# Patient Record
Sex: Female | Born: 1935 | Race: White | Hispanic: No | Smoking: Never smoker
Health system: Southern US, Community
[De-identification: ages and names within clinical notes are randomized; demographics above are authoritative.]

## PROBLEM LIST (undated history)

## (undated) DIAGNOSIS — I739 Peripheral vascular disease, unspecified: Secondary | ICD-10-CM

## (undated) DIAGNOSIS — F0390 Unspecified dementia without behavioral disturbance: Secondary | ICD-10-CM

## (undated) DIAGNOSIS — K5790 Diverticulosis of intestine, part unspecified, without perforation or abscess without bleeding: Secondary | ICD-10-CM

## (undated) DIAGNOSIS — I10 Essential (primary) hypertension: Secondary | ICD-10-CM

## (undated) DIAGNOSIS — M810 Age-related osteoporosis without current pathological fracture: Secondary | ICD-10-CM

## (undated) DIAGNOSIS — E079 Disorder of thyroid, unspecified: Secondary | ICD-10-CM

## (undated) DIAGNOSIS — N289 Disorder of kidney and ureter, unspecified: Secondary | ICD-10-CM

## (undated) DIAGNOSIS — S42391B Other fracture of shaft of right humerus, initial encounter for open fracture: Secondary | ICD-10-CM

## (undated) DIAGNOSIS — E785 Hyperlipidemia, unspecified: Secondary | ICD-10-CM

## (undated) HISTORY — DX: Disorder of thyroid, unspecified: E07.9

## (undated) HISTORY — DX: Other fracture of shaft of right humerus, initial encounter for open fracture: S42.391B

## (undated) HISTORY — DX: Essential (primary) hypertension: I10

## (undated) HISTORY — DX: Age-related osteoporosis without current pathological fracture: M81.0

## (undated) HISTORY — DX: Unspecified dementia, unspecified severity, without behavioral disturbance, psychotic disturbance, mood disturbance, and anxiety: F03.90

## (undated) HISTORY — DX: Disorder of kidney and ureter, unspecified: N28.9

## (undated) HISTORY — DX: Hyperlipidemia, unspecified: E78.5

## (undated) HISTORY — DX: Diverticulosis of intestine, part unspecified, without perforation or abscess without bleeding: K57.90

## (undated) HISTORY — DX: Peripheral vascular disease, unspecified: I73.9

## (undated) HISTORY — PX: OTHER SURGICAL HISTORY: SHX169

## (undated) HISTORY — PX: TONSILLECTOMY: SUR1361

## (undated) HISTORY — PX: CHOLECYSTECTOMY: SHX55

## (undated) HISTORY — PX: APPENDECTOMY: SHX54

---

## 1970-04-22 HISTORY — PX: ABDOMINAL HYSTERECTOMY: SHX81

## 2006-03-10 HISTORY — PX: COLONOSCOPY: SHX174

## 2012-09-20 HISTORY — PX: OVARIAN CYST REMOVAL: SHX89

## 2018-11-06 ENCOUNTER — Other Ambulatory Visit: Payer: Self-pay

## 2018-11-09 ENCOUNTER — Encounter: Payer: Self-pay | Admitting: Family Medicine

## 2018-11-09 ENCOUNTER — Other Ambulatory Visit: Payer: Self-pay

## 2018-11-09 ENCOUNTER — Ambulatory Visit (INDEPENDENT_AMBULATORY_CARE_PROVIDER_SITE_OTHER): Payer: Medicare Other | Admitting: Family Medicine

## 2018-11-09 VITALS — BP 130/68 | HR 88 | Temp 97.9°F | Resp 16 | Ht 59.45 in | Wt 98.0 lb

## 2018-11-09 DIAGNOSIS — H3530 Unspecified macular degeneration: Secondary | ICD-10-CM | POA: Diagnosis not present

## 2018-11-09 DIAGNOSIS — E039 Hypothyroidism, unspecified: Secondary | ICD-10-CM | POA: Insufficient documentation

## 2018-11-09 DIAGNOSIS — M816 Localized osteoporosis [Lequesne]: Secondary | ICD-10-CM | POA: Diagnosis not present

## 2018-11-09 DIAGNOSIS — E782 Mixed hyperlipidemia: Secondary | ICD-10-CM

## 2018-11-09 DIAGNOSIS — M810 Age-related osteoporosis without current pathological fracture: Secondary | ICD-10-CM | POA: Insufficient documentation

## 2018-11-09 DIAGNOSIS — I10 Essential (primary) hypertension: Secondary | ICD-10-CM | POA: Diagnosis not present

## 2018-11-09 DIAGNOSIS — K529 Noninfective gastroenteritis and colitis, unspecified: Secondary | ICD-10-CM | POA: Insufficient documentation

## 2018-11-09 DIAGNOSIS — E785 Hyperlipidemia, unspecified: Secondary | ICD-10-CM | POA: Insufficient documentation

## 2018-11-09 NOTE — Assessment & Plan Note (Signed)
Obtain most recent labs for thyroid Continue levothyroixine

## 2018-11-09 NOTE — Progress Notes (Signed)
   Subjective:    Patient ID: Dawn Conner, female    DOB: [birth year], 85 y.o.   MRN: [id number]  Patient presents for New Patient- Establish Care (is not fasting)    Pt here to f/u chronic medical problems   [facility] PCP Dr. Balinda Quails MD, Barnabas Lister    Recently moved here with daughter Dawn Conner    Osteoporosis- was on reclast , on prolia now in April 2020    Calcium  600mg  and Vitamin D 1000IU    History of vitamin D def    Broke right arm/wrist around 1994 walking a dog she fell  Wears Dentures       Hypothyroidism- taking sythroid, no history of surgical intervejtion  45mcg once a day     HTN- Norvasc 5mg      Hyperlipidemia- zocor    Has allergy to sulfa drugs   she has diarrhea episodes that are chronic/ diverticulosis    Non smoker / no alcohol   Wears poise pad has some OAB   He has had cataract surgery they have been watering her eyes for macular degeneration she would like to establish with ophthalmologist here  Immunizations- believes UTD, except TDAP  Review Of Systems:  GEN- denies fatigue, fever, weight loss,weakness, recent illness HEENT- denies eye drainage, change in vision, nasal discharge, CVS- denies chest pain, palpitations RESP- denies SOB, cough, wheeze ABD- denies N/V, change in stools, abd pain GU- denies dysuria, hematuria, dribbling, incontinence MSK- denies joint pain, muscle aches, injury Neuro- denies headache, dizziness, syncope, seizure activity       Objective:    BP 130/68   Pulse 88   Temp 97.9 F (36.6 C) (Oral)   Resp 16   Ht 4' 11.45" (1.51 m)   Wt 98 lb (44.5 kg)   SpO2 97%   BMI 19.50 kg/m  GEN- NAD, alert and oriented x3 HEENT- PERRL, EOMI, non injected sclera, pink conjunctiva, MMM, oropharynx clear Neck- Supple, no thyromegaly CVS- RRR, no murmur RESP-CTAB ABD-NABS,soft,NT,ND EXT- No edema Pulses- Radial, DP- 2+        Assessment & Plan:     I will review old records   Problem List Items  Addressed This Visit      Unprioritized   Chronic diarrhea    Watches diet, improved since moving down here with daughter who prepares meals Hold on GI for now       Hyperlipidemia    She has been tolerating zocor with norvasc      Relevant Medications   amLODipine (NORVASC) 5 MG tablet   lisinopril (ZESTRIL) 20 MG tablet   simvastatin (ZOCOR) 40 MG tablet   aspirin EC 81 MG tablet   Hypertension    Well controlled no changes       Relevant Medications   amLODipine (NORVASC) 5 MG tablet   lisinopril (ZESTRIL) 20 MG tablet   simvastatin (ZOCOR) 40 MG tablet   aspirin EC 81 MG tablet   Hypothyroidism    Obtain most recent labs for thyroid Continue levothyroixine      Relevant Medications   levothyroxine (SYNTHROID) 75 MCG tablet   Osteoporosis    Plan to restart Prolia injections Obtain last bone density       Relevant Medications   Cholecalciferol (VITAMIN D) 50 MCG (2000 UT) tablet      Note: This dictation was prepared with Dragon dictation along with smaller phrase technology. Any transcriptional errors that result from this process are unintentional.

## 2018-11-09 NOTE — Assessment & Plan Note (Signed)
Plan to restart Prolia injections Obtain last bone density

## 2018-11-09 NOTE — Assessment & Plan Note (Signed)
She has been tolerating zocor with norvasc

## 2018-11-09 NOTE — Patient Instructions (Addendum)
Prolia to be set up Release of records for previous PCP and GYN Referral to eye doctor  F/U 6 months

## 2018-11-09 NOTE — Assessment & Plan Note (Signed)
Watches diet, improved since moving down here with daughter who prepares meals Hold on GI for now

## 2018-11-09 NOTE — Assessment & Plan Note (Signed)
Well controlled no changes 

## 2018-11-18 ENCOUNTER — Other Ambulatory Visit: Payer: Self-pay | Admitting: *Deleted

## 2018-11-18 ENCOUNTER — Telehealth: Payer: Self-pay | Admitting: *Deleted

## 2018-11-18 DIAGNOSIS — E039 Hypothyroidism, unspecified: Secondary | ICD-10-CM

## 2018-11-18 DIAGNOSIS — I10 Essential (primary) hypertension: Secondary | ICD-10-CM

## 2018-11-18 DIAGNOSIS — E782 Mixed hyperlipidemia: Secondary | ICD-10-CM

## 2018-11-18 NOTE — Telephone Encounter (Signed)
-----   Message from Alycia Rossetti, MD sent at 11/18/2018 12:02 PM EDT ----- Regarding: Call pt daughter Ihor Austin   The records I have show her last Lipid was in  2018 and Thyroid done 1 year ago. She needs updated labs      Hypothyroidism- TSH/FT3     HTN- CMET/CBC    Hyperlipidemia-  Lipid panel

## 2018-11-18 NOTE — Telephone Encounter (Signed)
Call placed to patient and patient daughter made aware.   Future lab orders placed.

## 2018-11-20 ENCOUNTER — Other Ambulatory Visit: Payer: Self-pay | Admitting: *Deleted

## 2018-12-07 ENCOUNTER — Other Ambulatory Visit: Payer: Medicare Other

## 2018-12-07 ENCOUNTER — Other Ambulatory Visit: Payer: Self-pay

## 2018-12-07 DIAGNOSIS — E039 Hypothyroidism, unspecified: Secondary | ICD-10-CM

## 2018-12-07 DIAGNOSIS — I10 Essential (primary) hypertension: Secondary | ICD-10-CM

## 2018-12-07 DIAGNOSIS — E782 Mixed hyperlipidemia: Secondary | ICD-10-CM

## 2018-12-08 LAB — CBC WITH DIFFERENTIAL/PLATELET
Absolute Monocytes: 679 cells/uL (ref 200–950)
Basophils Absolute: 41 cells/uL (ref 0–200)
Basophils Relative: 0.7 %
Eosinophils Absolute: 159 cells/uL (ref 15–500)
Eosinophils Relative: 2.7 %
HCT: 37.3 % (ref 35.0–45.0)
Hemoglobin: 11.8 g/dL (ref 11.7–15.5)
Lymphs Abs: 1864 cells/uL (ref 850–3900)
MCH: 26.7 pg — ABNORMAL LOW (ref 27.0–33.0)
MCHC: 31.6 g/dL — ABNORMAL LOW (ref 32.0–36.0)
MCV: 84.4 fL (ref 80.0–100.0)
MPV: 11.6 fL (ref 7.5–12.5)
Monocytes Relative: 11.5 %
Neutro Abs: 3157 cells/uL (ref 1500–7800)
Neutrophils Relative %: 53.5 %
Platelets: 204 10*3/uL (ref 140–400)
RBC: 4.42 10*6/uL (ref 3.80–5.10)
RDW: 13.6 % (ref 11.0–15.0)
Total Lymphocyte: 31.6 %
WBC: 5.9 10*3/uL (ref 3.8–10.8)

## 2018-12-08 LAB — COMPLETE METABOLIC PANEL WITH GFR
AG Ratio: 1.3 (calc) (ref 1.0–2.5)
ALT: 21 U/L (ref 6–29)
AST: 27 U/L (ref 10–35)
Albumin: 3.9 g/dL (ref 3.6–5.1)
Alkaline phosphatase (APISO): 45 U/L (ref 37–153)
BUN/Creatinine Ratio: 25 (calc) — ABNORMAL HIGH (ref 6–22)
BUN: 29 mg/dL — ABNORMAL HIGH (ref 7–25)
CO2: 27 mmol/L (ref 20–32)
Calcium: 9.8 mg/dL (ref 8.6–10.4)
Chloride: 107 mmol/L (ref 98–110)
Creat: 1.16 mg/dL — ABNORMAL HIGH (ref 0.60–0.88)
GFR, Est African American: 48 mL/min/{1.73_m2} — ABNORMAL LOW (ref >=60)
GFR, Est Non African American: 42 mL/min/{1.73_m2} — ABNORMAL LOW (ref >=60)
Globulin: 3.1 g/dL (calc) (ref 1.9–3.7)
Glucose, Bld: 96 mg/dL (ref 65–99)
Potassium: 4.6 mmol/L (ref 3.5–5.3)
Sodium: 141 mmol/L (ref 135–146)
Total Bilirubin: 0.5 mg/dL (ref 0.2–1.2)
Total Protein: 7 g/dL (ref 6.1–8.1)

## 2018-12-08 LAB — T3, FREE: T3, Free: 2.8 pg/mL (ref 2.3–4.2)

## 2018-12-08 LAB — LIPID PANEL
Cholesterol: 169 mg/dL (ref <200)
HDL: 51 mg/dL (ref >=50)
LDL Cholesterol (Calc): 99 mg/dL (calc)
Non-HDL Cholesterol (Calc): 118 mg/dL (calc) (ref <130)
Total CHOL/HDL Ratio: 3.3 (calc) (ref <5.0)
Triglycerides: 94 mg/dL (ref <150)

## 2018-12-08 LAB — TSH: TSH: 0.99 mIU/L (ref 0.40–4.50)

## 2018-12-08 LAB — T4, FREE: Free T4: 1.3 ng/dL (ref 0.8–1.8)

## 2018-12-23 ENCOUNTER — Other Ambulatory Visit: Payer: Self-pay | Admitting: Family Medicine

## 2018-12-23 MED ORDER — AMLODIPINE BESYLATE 5 MG PO TABS: 5.0000 mg | ORAL_TABLET | Freq: Every day | ORAL | 3 refills | Status: DC

## 2019-02-08 ENCOUNTER — Encounter: Payer: Self-pay | Admitting: Family Medicine

## 2019-02-08 ENCOUNTER — Ambulatory Visit (INDEPENDENT_AMBULATORY_CARE_PROVIDER_SITE_OTHER): Payer: Medicare Other | Admitting: Family Medicine

## 2019-02-08 ENCOUNTER — Other Ambulatory Visit: Payer: Self-pay

## 2019-02-08 VITALS — BP 132/70 | HR 82 | Temp 97.9°F | Resp 14 | Ht 59.45 in | Wt 99.8 lb

## 2019-02-08 DIAGNOSIS — Z23 Encounter for immunization: Secondary | ICD-10-CM

## 2019-02-08 DIAGNOSIS — R413 Other amnesia: Secondary | ICD-10-CM | POA: Diagnosis not present

## 2019-02-08 NOTE — Progress Notes (Signed)
   Subjective:    Patient ID: Dawn Conner, female    DOB: 10-13-1927, 85 y.o.   MRN: [id number]  Patient presents for Memory Loss   Patient here with her daughter.  She has noticed over the past few months worsening memory changes.  She is actually had some changes over the past couple years but she now lives here with her daughter.  She will often forget names or dates.  She often stays the wrong year.  She does need help with grocery shopping making list.  Her daughter does her finances.  She has not had any behavioral problems.  She sleeps well her appetite is good.  There has been no significant medication changes recently.       Review Of Systems:  GEN- denies fatigue, fever, weight loss,weakness, recent illness HEENT- denies eye drainage, change in vision, nasal discharge, CVS- denies chest pain, palpitations RESP- denies SOB, cough, wheeze ABD- denies N/V, change in stools, abd pain GU- denies dysuria, hematuria, dribbling, incontinence MSK- denies joint pain, muscle aches, injury Neuro- denies headache, dizziness, syncope, seizure activity       Objective:    BP 132/70   Pulse 82   Temp 97.9 F (36.6 C) (Oral)   Resp 14   Ht 4' 11.45" (1.51 m)   Wt 99 lb 12.8 oz (45.3 kg)   SpO2 99%   BMI 19.85 kg/m  GEN- NAD, alert and oriented x3 HEENT- PERRL, EOMI, non injected sclera, pink conjunctiva, MMM, oropharynx clear Neck- Supple, no thyromegaly CVS- RRR, no murmur RESP-CTAB Psych- pleasant, normal affect and mood  EXT- No edema Pulses- Radial, DP- 2+  MMSE 21/30      Assessment & Plan:      Problem List Items Addressed This Visit    None    Visit Diagnoses    Memory loss    -  Primary   Memory loss/changes, some of this is age related but she also had HTN comorbidity that can lead to vascular dementia. Family would like this worked up further.  I will check a B12 level and metabolic panel.  We will plan for an MRI of the brain she will need Valium 2 mg prior  to the MRI for anxiety.  After that we will decide if one of the medication such as Aricept or Namenda would be beneficial to her.   Relevant Orders   Vitamin B12 (Completed)   Basic metabolic panel (Completed)   Need for immunization against influenza       Relevant Orders   Flu Vaccine QUAD 36+ mos IM (Completed)      Note: This dictation was prepared with Dragon dictation along with smaller phrase technology. Any transcriptional errors that result from this process are unintentional.

## 2019-02-08 NOTE — Patient Instructions (Addendum)
MRi of brain to be done  We will call with lab results  Flu shot given  Valium 2mg 

## 2019-02-09 ENCOUNTER — Encounter: Payer: Self-pay | Admitting: Family Medicine

## 2019-02-09 LAB — BASIC METABOLIC PANEL
BUN/Creatinine Ratio: 28 (calc) — ABNORMAL HIGH (ref 6–22)
BUN: 36 mg/dL — ABNORMAL HIGH (ref 7–25)
CO2: 25 mmol/L (ref 20–32)
Calcium: 9.6 mg/dL (ref 8.6–10.4)
Chloride: 105 mmol/L (ref 98–110)
Creat: 1.29 mg/dL — ABNORMAL HIGH (ref 0.60–0.88)
Glucose, Bld: 123 mg/dL — ABNORMAL HIGH (ref 65–99)
Potassium: 4.7 mmol/L (ref 3.5–5.3)
Sodium: 140 mmol/L (ref 135–146)

## 2019-02-09 LAB — VITAMIN B12: Vitamin B-12: 406 pg/mL (ref 200–1100)

## 2019-02-09 MED ORDER — DIAZEPAM 2 MG PO TABS: ORAL_TABLET | ORAL | 0 refills | Status: DC

## 2019-02-16 ENCOUNTER — Ambulatory Visit (HOSPITAL_COMMUNITY): Payer: Medicare Other

## 2019-02-19 ENCOUNTER — Ambulatory Visit (HOSPITAL_COMMUNITY)
Admission: RE | Admit: 2019-02-19 | Discharge: 2019-02-19 | Disposition: A | Discharge status: Home / Self Care | Payer: Medicare Other | Source: Ambulatory Visit | Attending: Family Medicine | Admitting: Family Medicine

## 2019-02-19 ENCOUNTER — Other Ambulatory Visit: Payer: Self-pay

## 2019-02-19 DIAGNOSIS — R413 Other amnesia: Secondary | ICD-10-CM | POA: Insufficient documentation

## 2019-02-23 ENCOUNTER — Other Ambulatory Visit: Payer: Self-pay

## 2019-02-23 MED ORDER — DONEPEZIL HCL 5 MG PO TABS: 5.0000 mg | ORAL_TABLET | Freq: Every day | ORAL | 1 refills | Status: DC

## 2019-02-26 ENCOUNTER — Other Ambulatory Visit: Payer: Self-pay | Admitting: *Deleted

## 2019-02-26 MED ORDER — LISINOPRIL 20 MG PO TABS: 20.0000 mg | ORAL_TABLET | Freq: Every day | ORAL | 3 refills | Status: DC

## 2019-03-12 ENCOUNTER — Other Ambulatory Visit: Payer: Self-pay | Admitting: *Deleted

## 2019-03-12 MED ORDER — SIMVASTATIN 40 MG PO TABS: 40.0000 mg | ORAL_TABLET | Freq: Every day | ORAL | 3 refills | Status: DC

## 2019-04-02 ENCOUNTER — Other Ambulatory Visit: Payer: Self-pay | Admitting: *Deleted

## 2019-04-02 MED ORDER — LEVOTHYROXINE SODIUM 75 MCG PO TABS: 75.0000 ug | ORAL_TABLET | Freq: Every day | ORAL | 1 refills | Status: DC

## 2019-04-08 ENCOUNTER — Other Ambulatory Visit: Payer: Self-pay

## 2019-04-08 ENCOUNTER — Ambulatory Visit (INDEPENDENT_AMBULATORY_CARE_PROVIDER_SITE_OTHER): Payer: Medicare Other

## 2019-04-08 DIAGNOSIS — M816 Localized osteoporosis [Lequesne]: Secondary | ICD-10-CM

## 2019-04-08 MED ORDER — DENOSUMAB 60 MG/ML ~~LOC~~ SOSY
60.0000 mg | PREFILLED_SYRINGE | SUBCUTANEOUS | Status: DC
  Administered 2019-04-08 – 2022-03-27 (×4): 60 mg via SUBCUTANEOUS

## 2019-04-08 NOTE — Progress Notes (Signed)
Patient was given her prolia injection in the right arm. Patient tolerated well. Advised to return in 6 months for next injection.

## 2019-04-21 ENCOUNTER — Telehealth: Payer: Self-pay | Admitting: *Deleted

## 2019-04-21 DIAGNOSIS — K529 Noninfective gastroenteritis and colitis, unspecified: Secondary | ICD-10-CM

## 2019-04-21 NOTE — Telephone Encounter (Signed)
Received call from patient daughter. Reports that she would like to proceed with referral to GI for patient for chronic diarrhea.   MD made aware and approved.   Referral orders placed.   Call placed to patient and patient daughter made aware per VM.

## 2019-04-21 NOTE — Telephone Encounter (Signed)
Agree with referral

## 2019-04-22 ENCOUNTER — Other Ambulatory Visit: Payer: Self-pay | Admitting: Family Medicine

## 2019-04-26 ENCOUNTER — Encounter: Payer: Self-pay | Admitting: Gastroenterology

## 2019-05-14 ENCOUNTER — Ambulatory Visit (INDEPENDENT_AMBULATORY_CARE_PROVIDER_SITE_OTHER): Payer: Medicare Other | Admitting: Family Medicine

## 2019-05-14 ENCOUNTER — Other Ambulatory Visit: Payer: Self-pay

## 2019-05-14 VITALS — BP 122/76 | HR 82 | Temp 97.6°F | Resp 18 | Ht 59.45 in | Wt 102.4 lb

## 2019-05-14 DIAGNOSIS — E039 Hypothyroidism, unspecified: Secondary | ICD-10-CM | POA: Diagnosis not present

## 2019-05-14 DIAGNOSIS — M816 Localized osteoporosis [Lequesne]: Secondary | ICD-10-CM

## 2019-05-14 DIAGNOSIS — N289 Disorder of kidney and ureter, unspecified: Secondary | ICD-10-CM

## 2019-05-14 DIAGNOSIS — I10 Essential (primary) hypertension: Secondary | ICD-10-CM | POA: Diagnosis not present

## 2019-05-14 MED ORDER — LISINOPRIL 20 MG PO TABS: 10.0000 mg | ORAL_TABLET | Freq: Every day | ORAL | 3 refills | Status: DC

## 2019-05-14 NOTE — Patient Instructions (Addendum)
Decrease lisinopril to 10mg  once a day  We will call with lab results F/U End of June for Physical

## 2019-05-14 NOTE — Progress Notes (Signed)
    Subjective:    Patient ID: Dawn Conner, female    DOB: Jun 02, 1927, 86 y.o.   MRN: [id number]  Patient presents for Hypertension (follow up)   Appetite has been fair. She is now over 100lbs.    Has appt with GI on Wed for the chronic diarrhea      HTN- on occasion she has complained of being lightheaded or dizzy. Family checks BP sometimes, has not been taken recently. No complaint of SOB, chest pain, no falls, no syncope   Hypothyroidism- taking Levothyroxine as prescribed   Dementia- aricept at bedtime, no major changes in memory, things have been harder since she is mostly within the home due to COVID-19 pandemic   Osteoporosis- prolia shot given in December     Review Of Systems: per pt and daughter   GEN- denies fatigue, fever, weight loss,weakness, recent illness HEENT- denies eye drainage, change in vision, nasal discharge, CVS- denies chest pain, palpitations RESP- denies SOB, cough, wheeze ABD- denies N/V, change in stools, abd pain GU- denies dysuria, hematuria, dribbling, incontinence MSK- denies joint pain, muscle aches, injury Neuro- denies headache, +dizziness, syncope, seizure activity       Objective:    BP 122/76 (BP Location: Right Arm, Patient Position: Sitting, Cuff Size: Normal)   Pulse 82   Temp 97.6 F (36.4 C) (Oral)   Resp 18   Ht 4' 11.45" (1.51 m)   Wt 102 lb 6.4 oz (46.4 kg)   SpO2 99%   BMI 20.37 kg/m  GEN- NAD, alert and oriented x 2 HEENT- PERRL, EOMI, non injected sclera, pink conjunctiva,  Neck- Supple, no thyromegaly CVS- RRR, no murmur RESP-CTAB ABD-NABS,soft,NT,ND EXT- No edema Pulses- Radial  2+        Assessment & Plan:      Problem List Items Addressed This Visit      Unprioritized   Hypertension - Primary    Repeat BP was higher, however at her age concerned about dizziness and overtreatment with the BP Due to her renal function, will reduce lisinopril to 10mg  first  Continue norvasc 5mg        Relevant  Medications   lisinopril (ZESTRIL) 20 MG tablet   Other Relevant Orders   BASIC METABOLIC PANEL WITH GFR (Completed)   CBC with Differential/Platelet (Completed)   Hypothyroidism    Continue levothyroxine      Relevant Orders   TSH (Completed)   Osteoporosis    Continue Vitamin D Prolia every 6 months        Other Visit Diagnoses    Renal insufficiency       Relevant Orders   BASIC METABOLIC PANEL WITH GFR (Completed)      Note: This dictation was prepared with Dragon dictation along with smaller phrase technology. Any transcriptional errors that result from this process are unintentional.

## 2019-05-15 LAB — CBC WITH DIFFERENTIAL/PLATELET
Absolute Monocytes: 880 cells/uL (ref 200–950)
Basophils Absolute: 42 cells/uL (ref 0–200)
Basophils Relative: 0.5 %
Eosinophils Absolute: 58 cells/uL (ref 15–500)
Eosinophils Relative: 0.7 %
HCT: 38.8 % (ref 35.0–45.0)
Hemoglobin: 12.3 g/dL (ref 11.7–15.5)
Lymphs Abs: 2166 cells/uL (ref 850–3900)
MCH: 26.2 pg — ABNORMAL LOW (ref 27.0–33.0)
MCHC: 31.7 g/dL — ABNORMAL LOW (ref 32.0–36.0)
MCV: 82.6 fL (ref 80.0–100.0)
MPV: 11.9 fL (ref 7.5–12.5)
Monocytes Relative: 10.6 %
Neutro Abs: 5154 cells/uL (ref 1500–7800)
Neutrophils Relative %: 62.1 %
Platelets: 238 10*3/uL (ref 140–400)
RBC: 4.7 10*6/uL (ref 3.80–5.10)
RDW: 14 % (ref 11.0–15.0)
Total Lymphocyte: 26.1 %
WBC: 8.3 10*3/uL (ref 3.8–10.8)

## 2019-05-15 LAB — BASIC METABOLIC PANEL WITH GFR
BUN/Creatinine Ratio: 23 (calc) — ABNORMAL HIGH (ref 6–22)
BUN: 32 mg/dL — ABNORMAL HIGH (ref 7–25)
CO2: 21 mmol/L (ref 20–32)
Calcium: 10.2 mg/dL (ref 8.6–10.4)
Chloride: 103 mmol/L (ref 98–110)
Creat: 1.41 mg/dL — ABNORMAL HIGH (ref 0.60–0.88)
GFR, Est African American: 38 mL/min/{1.73_m2} — ABNORMAL LOW (ref >=60)
GFR, Est Non African American: 33 mL/min/{1.73_m2} — ABNORMAL LOW (ref >=60)
Glucose, Bld: 95 mg/dL (ref 65–99)
Potassium: 4.9 mmol/L (ref 3.5–5.3)
Sodium: 139 mmol/L (ref 135–146)

## 2019-05-15 LAB — TSH: TSH: 1.98 mIU/L (ref 0.40–4.50)

## 2019-05-16 ENCOUNTER — Encounter: Payer: Self-pay | Admitting: Family Medicine

## 2019-05-16 NOTE — Assessment & Plan Note (Signed)
Continue levothyroxine 

## 2019-05-16 NOTE — Assessment & Plan Note (Signed)
Continue Vitamin D Prolia every 6 months

## 2019-05-16 NOTE — Assessment & Plan Note (Signed)
Repeat BP was higher, however at her age concerned about dizziness and overtreatment with the BP Due to her renal function, will reduce lisinopril to 10mg  first  Continue norvasc 5mg 

## 2019-05-17 ENCOUNTER — Other Ambulatory Visit: Payer: Self-pay | Admitting: Family Medicine

## 2019-05-17 DIAGNOSIS — N289 Disorder of kidney and ureter, unspecified: Secondary | ICD-10-CM

## 2019-05-18 ENCOUNTER — Encounter: Payer: Self-pay | Admitting: Gastroenterology

## 2019-05-18 NOTE — Progress Notes (Signed)
Referring Provider: Salley Scarlet, MD Primary Care Physician:  Salley Scarlet, MD Primary Gastroenterologist:  Dr. Darrick Penna  Chief Complaint  Patient presents with  . Diarrhea    HPI:   Dawn Conner is a 86 y.o. female presenting today at the request of Jeanice Lim, Velna Hatchet, MD for chronic diarrhea. Medical history significant for HTN, HLD, renal insufficiency, hypothyroidism, osteoporosis.   Reviewed relevant PCP note dated 11/09/18. Patient was a new patient at that time. Reported chronic episodes of diarrhea. Felt this was improved since moving locally and loving with daughter who prepares meals.  Telephone note dated 04/21/19 with patient's daughter requesting GI referral for chronic diarrhea.   Today: Patient presents today with Dawn Conner, her daughter, who helps provide history.   Chronic diarrhea. Present for more than 10 years. Feels like it could be a little worse but daughter isn't sure as patient just moved here in March 2020 to live with her. She used to live in [place]. Diarrhea comes and goes. Not every day. About 3 days a week. Stools are mushy. Rarely will be watery. Maybe more than 3 stools a day but not more than 5 on days she has diarrhea. Rarely will have to wake up in the middle of the night to have a BM. Diarrhea is typically after eating. Associated urgency. Some intermittent cramping before BMs that resolves after a BM. Diagnosed with IBS in the past. Can't remember medications she may have been on in the past. Most recently, she had been controlling diarrhea with diet. Dairy products worsen diarrhea. Continues eating bowl of cereal 4-5 days a week. Also with history of cholecystectomy. Like ground beef and pasta. Has a lot of gas chronically, doesn't cause any trouble. If she has several episodes of diarrhea, there has been times when she has had some blood on the toilet tissue. Can't remember when the last occurrence was. Has only occurred 1-2 times since March 2020.   No melena. No abdominal pain aside from cramping with diarrhea. Aside from diarrhea, will have 1-2 BMs a day that are soft and formed.   She has mild bowel and bladder incontinence at times. Wears depends.   Taking imodium, about 2 times a week or less.  No recent weight loss.   No nausea or vomiting. No heartburn or acid reflux. No dysphagia.   Aleve or advil rarely. Maybe once every couple of weeks.   Daughter reports prior GI surgery but can't remember what it was or what it was for. Thinks she had ulcers int he past. Last TCS 20+ years ago. Not sure about history of polyps.   Past Medical History:  Diagnosis Date  . Dementia (HCC)   . Diverticulosis   . Hyperlipidemia   . Hypertension   . Osteoporosis   . Oth fracture of shaft of right humerus, init for opn fx 1990's  . Renal insufficiency   . Thyroid disease     Past Surgical History:  Procedure Laterality Date  . ABDOMINAL HYSTERECTOMY  1972  . APPENDECTOMY    . cataract surgery     Early 2000's both eyes   . CHOLECYSTECTOMY  1980's  . OVARIAN CYST REMOVAL  09/2012  . TONSILLECTOMY  age 79    Current Outpatient Medications  Medication Sig Dispense Refill  . amLODipine (NORVASC) 5 MG tablet Take 1 tablet (5 mg total) by mouth daily. 90 tablet 3  . aspirin EC 81 MG tablet Take 81 mg by mouth daily.    Marland Kitchen  Cholecalciferol (VITAMIN D) 50 MCG (2000 UT) tablet Take 4,000 Units by mouth daily.    Marland Kitchen donepezil (ARICEPT) 5 MG tablet TAKE 1 TABLET BY MOUTH AT BEDTIME 30 tablet 0  . levothyroxine (SYNTHROID) 75 MCG tablet Take 1 tablet (75 mcg total) by mouth daily before breakfast. 90 tablet 1  . lisinopril (ZESTRIL) 20 MG tablet Take 0.5 tablets (10 mg total) by mouth daily. (Patient taking differently: Take 10 mg by mouth daily. On hold this week and then 10 mg daily) 90 tablet 3  . Multiple Minerals-Vitamins (CALCIUM-MAGNESIUM-ZINC-D3 PO) Take 1-2 tablets by mouth daily.    . simvastatin (ZOCOR) 40 MG tablet Take 1 tablet (40  mg total) by mouth daily. 90 tablet 3   Current Facility-Administered Medications  Medication Dose Route Frequency Provider Last Rate Last Admin  . denosumab (PROLIA) injection 60 mg  60 mg Subcutaneous Q6 months Alycia Rossetti, MD   60 mg at 04/08/19 1025    Allergies as of 05/19/2019 - Review Complete 05/19/2019  Allergen Reaction Noted  . Sulfa antibiotics  11/09/2018    Family History  Problem Relation Age of Onset  . Colon cancer Neg Hx     Social History   Socioeconomic History  . Marital status: Widowed    Spouse name: Not on file  . Number of children: Not on file  . Years of education: Not on file  . Highest education level: Not on file  Occupational History  . Not on file  Tobacco Use  . Smoking status: Never Smoker  . Smokeless tobacco: Never Used  Substance and Sexual Activity  . Alcohol use: Never  . Drug use: Never  . Sexual activity: Not Currently  Other Topics Concern  . Not on file  Social History Narrative  . Not on file   Social Determinants of Health   Financial Resource Strain: Low Risk   . Difficulty of Paying Living Expenses: Not hard at all  Food Insecurity: No Food Insecurity  . Worried About Charity fundraiser in the Last Year: Never true  . Ran Out of Food in the Last Year: Never true  Transportation Needs: No Transportation Needs  . Lack of Transportation (Medical): No  . Lack of Transportation (Non-Medical): No  Physical Activity: Inactive  . Days of Exercise per Week: 0 days  . Minutes of Exercise per Session: 0 min  Stress: No Stress Concern Present  . Feeling of Stress : Not at all  Social Connections: Unknown  . Frequency of Communication with Friends and Family: More than three times a week  . Frequency of Social Gatherings with Friends and Family: More than three times a week  . Attends Religious Services: More than 4 times per year  . Active Member of Clubs or Organizations: No  . Attends Archivist Meetings:  Never  . Marital Status: Not on file  Intimate Partner Violence: Not At Risk  . Fear of Current or Ex-Partner: No  . Emotionally Abused: No  . Physically Abused: No  . Sexually Abused: No    Review of Systems: Gen: Denies any fever, chills, lightheadedness, dizziness, pre-syncope, or syncope.  HEENT: No nasal congestion or sore throat.  CV: Denies chest pain or heart palpitations Resp: Denies shortness of breath at rest. Occasional cough.  GI: See HPI GU : Denies urinary burning, urinary frequency, urinary hesitancy. MS: Denies regular joint pain.  Derm: Denies rash.  Psych: Denies depression or anxiety Heme: Admits to easy bruising  at times.   Physical Exam: BP (!) 162/74   Pulse 76   Temp (!) 97.5 F (36.4 C) (Temporal)   Ht 5\' 1"  (1.549 m)   Wt 101 lb 9.6 oz (46.1 kg)   BMI 19.20 kg/m  General:   Alert and oriented. Pleasant and cooperative. Thin. No acute distress.   Head:  Normocephalic and atraumatic. Eyes:  Without icterus, sclera clear and conjunctiva pink.  Ears:  Normal auditory acuity. Lungs:  Clear to auscultation bilaterally. No wheezes, rales, or rhonchi. No distress.  Heart:  S1, S2 present without murmurs appreciated.  Abdomen:  +BS, soft, non-tender and non-distended. No HSM noted. No guarding or rebound. No masses appreciated.  Rectal:  Deferred  Msk:  Symmetrical without gross deformities. Normal posture. Extremities:  Without edema. Neurologic:  Alert and  oriented x4;  grossly normal neurologically. Skin:  Intact without significant lesions or rashes. Psych: Normal mood and affect.

## 2019-05-19 ENCOUNTER — Encounter: Payer: Self-pay | Admitting: Gastroenterology

## 2019-05-19 ENCOUNTER — Other Ambulatory Visit: Payer: Self-pay

## 2019-05-19 ENCOUNTER — Ambulatory Visit (INDEPENDENT_AMBULATORY_CARE_PROVIDER_SITE_OTHER): Payer: Medicare Other | Admitting: Gastroenterology

## 2019-05-19 ENCOUNTER — Ambulatory Visit: Payer: Medicare Other | Admitting: Gastroenterology

## 2019-05-19 DIAGNOSIS — R197 Diarrhea, unspecified: Secondary | ICD-10-CM

## 2019-05-19 NOTE — Patient Instructions (Addendum)
Please have labs completed.  Follow a lactose/dairy free diet will be sure to take Lactaid tablets prior to any dairy consumption.  Try taking 1 Tums prior to meals.  If you take this at breakfast, be sure this is at least 4 hours after you take levothyroxine.  You may take Imodium as needed for diarrhea. No more than 3 tablets in 24 hours.   We will plan to follow-up with you in 3 months.  Call if you have questions or concerns prior.  Aliene Altes, PA-C [facility]   Lactose-Free Diet, Adult If you have lactose intolerance, you are not able to digest lactose. Lactose is a natural sugar found mainly in dairy milk and dairy products. You may need to avoid all foods and beverages that contain lactose. A lactose-free diet can help you do this. Which foods have lactose? Lactose is found in dairy milk and dairy products, such as:  Yogurt.  Cheese.  Butter.  Margarine.  Sour cream.  Cream.  Whipped toppings and nondairy creamers.  Ice cream and other dairy-based desserts. Lactose is also found in foods or products made with dairy milk or milk ingredients. To find out whether a food contains dairy milk or a milk ingredient, look at the ingredients list. Avoid foods with the statement "May contain milk" and foods that contain:  Milk powder.  Whey.  Curd.  Caseinate.  Lactose.  Lactalbumin.  Lactoglobulin. What are alternatives to dairy milk and foods made with milk products?  Lactose-free milk.  Soy milk with added calcium and vitamin D.  Almond milk, coconut milk, rice milk, or other nondairy milk alternatives with added calcium and vitamin D. Note that these are low in protein.  Soy products, such as soy yogurt, soy cheese, soy ice cream, and soy-based sour cream.  Other nut milk products, such as almond yogurt, almond cheese, cashew yogurt, cashew cheese, cashew ice cream, coconut yogurt, and coconut ice cream. What are tips for following this  plan?  Do not consume foods, beverages, vitamins, minerals, or medicines containing lactose. Read ingredient lists carefully.  Look for the words "lactose-free" on labels.  Use lactase enzyme drops or tablets as directed by your health care provider.  Use lactose-free milk or a milk alternative, such as soy milk or almond milk, for drinking and cooking.  Make sure you get enough calcium and vitamin D in your diet. A lactose-free eating plan can be lacking in these important nutrients.  Take calcium and vitamin D supplements as directed by your health care provider. Talk to your health care provider about supplements if you are not able to get enough calcium and vitamin D from food. What foods can I eat?  Fruits All fresh, canned, frozen, or dried fruits that are not processed with lactose. Vegetables All fresh, frozen, and canned vegetables without cheese, cream, or butter sauces. Grains Any that are not made with dairy milk or dairy products. Meats and other proteins Any meat, fish, poultry, and other protein sources that are not made with dairy milk or dairy products. Soy cheese and yogurt. Fats and oils Any that are not made with dairy milk or dairy products. Beverages Lactose-free milk. Soy, rice, or almond milk with added calcium and vitamin D. Fruit and vegetable juices. Sweets and desserts Any that are not made with dairy milk or dairy products. Seasonings and condiments Any that are not made with dairy milk or dairy products. Calcium Calcium is found in many foods that contain lactose and  is important for bone health. The amount of calcium you need depends on your age:  Adults younger than 50 years: 1,000 mg of calcium a day.  Adults older than 50 years: 1,200 mg of calcium a day. If you are not getting enough calcium, you may get it from other sources, including:  Orange juice with calcium added. There are 300-350 mg of calcium in 1 cup of orange  juice.  Calcium-fortified soy milk. There are 300-400 mg of calcium in 1 cup of calcium-fortified soy milk.  Calcium-fortified rice or almond milk. There are 300 mg of calcium in 1 cup of calcium-fortified rice or almond milk.  Calcium-fortified breakfast cereals. There are 100-1,000 mg of calcium in calcium-fortified breakfast cereals.  Spinach, cooked. There are 145 mg of calcium in  cup of cooked spinach.  Edamame, cooked. There are 130 mg of calcium in  cup of cooked edamame.  Collard greens, cooked. There are 125 mg of calcium in  cup of cooked collard greens.  Kale, frozen or cooked. There are 90 mg of calcium in  cup of cooked or frozen kale.  Almonds. There are 95 mg of calcium in  cup of almonds.  Broccoli, cooked. There are 60 mg of calcium in 1 cup of cooked broccoli. The items listed above may not be a complete list of recommended foods and beverages. Contact a dietitian for more options. What foods are not recommended? Fruits None, unless they are made with dairy milk or dairy products. Vegetables None, unless they are made with dairy milk or dairy products. Grains Any grains that are made with dairy milk or dairy products. Meats and other proteins None, unless they are made with dairy milk or dairy products. Dairy All dairy products, including milk, goat's milk, buttermilk, kefir, acidophilus milk, flavored milk, evaporated milk, condensed milk, dulce de Clay, eggnog, yogurt, cheese, and cheese spreads. Fats and oils Any that are made with milk or milk products. Margarines and salad dressings that contain milk or cheese. Cream. Half and half. Cream cheese. Sour cream. Chip dips made with sour cream or yogurt. Beverages Hot chocolate. Cocoa with lactose. Instant iced teas. Powdered fruit drinks. Smoothies made with dairy milk or yogurt. Sweets and desserts Any that are made with milk or milk products. Seasonings and condiments Chewing gum that has lactose.  Spice blends if they contain lactose. Artificial sweeteners that contain lactose. Nondairy creamers. The items listed above may not be a complete list of foods and beverages to avoid. Contact a dietitian for more information. Summary  If you are lactose intolerant, it means that you have a hard time digesting lactose, a natural sugar found in milk and milk products.  Following a lactose-free diet can help you manage this condition.  Calcium is important for bone health and is found in many foods that contain lactose. Talk with your health care provider about other sources of calcium. This information is not intended to replace advice given to you by your health care provider. Make sure you discuss any questions you have with your health care provider. Document Revised: 05/06/2017 Document Reviewed: 05/06/2017 Elsevier Patient Education  2020 [employer].

## 2019-05-20 LAB — IGA: Immunoglobulin A: 344 mg/dL — ABNORMAL HIGH (ref 70–320)

## 2019-05-20 LAB — TISSUE TRANSGLUTAMINASE, IGA: (tTG) Ab, IgA: 1 U/mL

## 2019-05-20 NOTE — Progress Notes (Signed)
CC'ED TO PCP 

## 2019-05-20 NOTE — Assessment & Plan Note (Addendum)
86 y.o. female  with history of chronic diarrhea who recently moved from [place] in March 2020. Previously seen by GI in [place] and diagnosed with IBS. She presents today for further evaluation/recommendations as she has not seen GI since living in [place]. Diarrhea is intermittent, about 3 days a week with 3-5 mushy stools a day. Rare nocturnal stools. Associated abdominal cramping prior to BMs that resolves thereafter. One may two episodes of toilet tissue hematochezia in the setting of frequent stools since March 2020, can't remember when the last occurrence was. No melena or recent weight loss. Most recent labs on file from Jan 2020 with hemoglobin 12.3, TSH normal, BMP with Cr 1.41, electrolytes within normal limits. Admits to worsening diarrhea with dairy but eats cereal with milk 4-5 days a week. Also is post cholecystectomy and admits to eating ground beef frequently. No regular NSAID use. Last TCS 20+ years ago. Not sure about history of polyps. Daughter reports some prior GI surgery but can't remember what this was.   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   Advised to follow a lactose free/dairy free diet or take lactaid pills prior to all dairy.  Take 1 tums prior to meals. Be sure this is not within 4 hours of taking levothyroxine. May not be able to take tums with breakfast. Check celiac serologies, IgA and ttG IgA Imodium as needed for diarrhea. No more than 3 tablets in 24 hours. Daughter is requesting records from .  Follow-up in 3 months. Call if questions or concerns prior.

## 2019-05-28 ENCOUNTER — Other Ambulatory Visit: Payer: Self-pay | Admitting: Family Medicine

## 2019-06-04 ENCOUNTER — Other Ambulatory Visit: Payer: Self-pay

## 2019-06-04 ENCOUNTER — Other Ambulatory Visit: Payer: Medicare Other

## 2019-06-04 DIAGNOSIS — N289 Disorder of kidney and ureter, unspecified: Secondary | ICD-10-CM

## 2019-06-05 LAB — BASIC METABOLIC PANEL
BUN/Creatinine Ratio: 23 (calc) — ABNORMAL HIGH (ref 6–22)
BUN: 29 mg/dL — ABNORMAL HIGH (ref 7–25)
CO2: 28 mmol/L (ref 20–32)
Calcium: 9.2 mg/dL (ref 8.6–10.4)
Chloride: 105 mmol/L (ref 98–110)
Creat: 1.27 mg/dL — ABNORMAL HIGH (ref 0.60–0.88)
Glucose, Bld: 99 mg/dL (ref 65–99)
Potassium: 4.7 mmol/L (ref 3.5–5.3)
Sodium: 140 mmol/L (ref 135–146)

## 2019-06-08 ENCOUNTER — Other Ambulatory Visit: Payer: Self-pay | Admitting: *Deleted

## 2019-06-08 DIAGNOSIS — N289 Disorder of kidney and ureter, unspecified: Secondary | ICD-10-CM

## 2019-06-08 MED ORDER — LISINOPRIL 10 MG PO TABS: 10.0000 mg | ORAL_TABLET | Freq: Every day | ORAL | Status: DC

## 2019-06-13 ENCOUNTER — Telehealth: Payer: Self-pay | Admitting: Gastroenterology

## 2019-06-13 NOTE — Telephone Encounter (Signed)
Received TCS report from [facility] dated 03/10/2006  Indication: Family history of colon cancer, lower abdominal pain, irregular bowel habits with intermittent diarrhea  Findings: 1.  Numerous diverticula were noted within the sigmoid colon.  The colonoscope was maneuvered through this segment with some difficulty. 2.  Scattered diverticula were noted in all segments including the ascending colon.  Impression: Severe diverticulosis especially involving the sigmoid and to a lesser extent all other segments as well.  There was no evidence of neoplastic pathology.  She was advised to continue with her present regimen which included Metamucil as well as Questran which appeared to keep her fairly regular.  Consider repeat colonoscopy in 5 years.  No additional recommendations at this time.  We will follow up in 3 months as planned and see how she is doing.  We will have report scanned into her chart.

## 2019-06-29 ENCOUNTER — Other Ambulatory Visit: Payer: Self-pay | Admitting: Family Medicine

## 2019-07-02 ENCOUNTER — Other Ambulatory Visit: Payer: Medicare Other

## 2019-07-02 ENCOUNTER — Other Ambulatory Visit: Payer: Self-pay

## 2019-07-02 DIAGNOSIS — N289 Disorder of kidney and ureter, unspecified: Secondary | ICD-10-CM

## 2019-07-03 LAB — BASIC METABOLIC PANEL
BUN/Creatinine Ratio: 26 (calc) — ABNORMAL HIGH (ref 6–22)
BUN: 34 mg/dL — ABNORMAL HIGH (ref 7–25)
CO2: 23 mmol/L (ref 20–32)
Calcium: 9.3 mg/dL (ref 8.6–10.4)
Chloride: 108 mmol/L (ref 98–110)
Creat: 1.32 mg/dL — ABNORMAL HIGH (ref 0.60–0.88)
Glucose, Bld: 102 mg/dL — ABNORMAL HIGH (ref 65–99)
Potassium: 4.4 mmol/L (ref 3.5–5.3)
Sodium: 141 mmol/L (ref 135–146)

## 2019-07-05 ENCOUNTER — Encounter: Payer: Self-pay | Admitting: Gastroenterology

## 2019-07-26 ENCOUNTER — Other Ambulatory Visit: Payer: Self-pay | Admitting: Family Medicine

## 2019-08-27 ENCOUNTER — Other Ambulatory Visit: Payer: Self-pay | Admitting: Family Medicine

## 2019-08-27 MED ORDER — DONEPEZIL HCL 5 MG PO TABS: 5.0000 mg | ORAL_TABLET | Freq: Every day | ORAL | 0 refills | Status: DC

## 2019-08-28 ENCOUNTER — Encounter: Payer: Self-pay | Admitting: Gastroenterology

## 2019-08-28 NOTE — Progress Notes (Signed)
Referring Provider: Salley Scarlet, MD Primary Care Physician:  Salley Scarlet, MD Primary GI Physician: Dr. Darrick Penna  Chief Complaint  Patient presents with  . Diarrhea    some improvement, on dairy free/lactose diet    HPI:   Dawn Conner is a 86 y.o. female with a history of chronic diarrhea as well as HTN, HLD, renal insufficiency, hypothyroidism, and osteoporosis. She presents today for follow-up of diarrhea.  Last seen in our office on 05/19/2019. Patient had recently moved from [place] in March 2020 to live locally with daughter . She reported 10+ year history of diarrhea. Previously diagnosed with IBS. Diarrhea about 3 days a week with 3-5 mushy stools a day. Otherwise with 1-2 soft formed BMs daily. Rare nocturnal stools. Associated abdominal cramping prior to BMs that resolves thereafter. Worsening diarrhea with dairy but eats cereal with milk 4-5 days a week. Also is post cholecystectomy and admits to eating ground beef frequently. No regular NSAID use. 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 Advised to follow a lactose free/dairy free diet or take lactaid pills prior to all dairy, take 1 tums prior to meals, check celiac serologies, use imodium as needed for diarrhea, f/u in 3 months.   Celiac screen negative.   Received TCS records from [facility] dated 03/10/2006.  Indication: Family history of colon cancer, lower abdominal pain, irregular bowel habits with intermittent diarrhea. Impression: Severe diverticulosis especially involving the sigmoid and to a lesser extent all other segments as well.  There was no evidence of neoplastic pathology. She was to continue Metamucil as well as Questran which appeared to keep her fairly regular.   Today: Diarrhea has improved. Following lactose free diet. Not taking tums at every meals as daughter isn't home to help with this all the time. At least 2  meals a day. Stools are now more soft. Also with soft formed stools. No watery stools. BMs 1-2 times a day. Will have "bad days" here and there. Maybe 1-2 times a month. On bad days, will have 3 mushy stools on those days. Most recent occurrence was after picking up fried fish. Imodium once every every other week. This is if patient has complained about frequent BMs. Daughter sometimes isn't sure if she is having frequent stools during those times but will give one imodium.   No blood in the stool or black stool. Eating well. Appetite is good. Weight is stable. No abdominal pain. No N/V. No GERD symptoms. No trouble swallowing.   Breakfast: Cereal and toast Lunch: Deli meat and toast Dinner: Meat, vegetable, and starch   Has follow-up with PCP this month. Will recheck TSH.    Past Medical History:  Diagnosis Date  . Dementia (HCC)   . Diverticulosis   . Hyperlipidemia   . Hypertension   . Osteoporosis   . Oth fracture of shaft of right humerus, init for opn fx 1990's  . Renal insufficiency   . Thyroid disease     Past Surgical History:  Procedure Laterality Date  . ABDOMINAL HYSTERECTOMY  1972  . APPENDECTOMY    . cataract surgery     Early 2000's both eyes   . CHOLECYSTECTOMY  1980's  . COLONOSCOPY  03/10/2006   Severe diverticulosis especially involving the sigmoid and to a lesser extent all other segments as well.  There was no evidence of neoplastic pathology. Recommended continuing Metamucil as well as Questran which appeared to keep  her fairly regular. Consider repeat in 5 years.   . OVARIAN CYST REMOVAL  09/2012  . TONSILLECTOMY  age 87    Current Outpatient Medications  Medication Sig Dispense Refill  . amLODipine (NORVASC) 5 MG tablet Take 1 tablet (5 mg total) by mouth daily. 90 tablet 3  . aspirin EC 81 MG tablet Take 81 mg by mouth daily.    . calcium carbonate (TUMS - DOSED IN MG ELEMENTAL CALCIUM) 500 MG chewable tablet Chew 1 tablet by mouth. 1-2 meals per day     . Cholecalciferol (VITAMIN D) 50 MCG (2000 UT) tablet Take 4,000 Units by mouth daily.    Marland Kitchen donepezil (ARICEPT) 5 MG tablet TAKE 1 TABLET BY MOUTH AT BEDTIME 30 tablet 0  . donepezil (ARICEPT) 5 MG tablet Take 1 tablet (5 mg total) by mouth at bedtime. 30 tablet 0  . levothyroxine (SYNTHROID) 75 MCG tablet Take 1 tablet (75 mcg total) by mouth daily before breakfast. 90 tablet 1  . lisinopril (ZESTRIL) 10 MG tablet Take 1 tablet (10 mg total) by mouth daily.    Marland Kitchen loperamide (IMODIUM) 2 MG capsule Take by mouth as needed for diarrhea or loose stools.    . Multiple Minerals-Vitamins (CALCIUM-MAGNESIUM-ZINC-D3 PO) Take 1-2 tablets by mouth daily.    . simvastatin (ZOCOR) 40 MG tablet Take 1 tablet (40 mg total) by mouth daily. 90 tablet 3  . cholestyramine (QUESTRAN) 4 g packet Take 1 packet (4 g total) by mouth daily with breakfast. 30 each 5   Current Facility-Administered Medications  Medication Dose Route Frequency Provider Last Rate Last Admin  . denosumab (PROLIA) injection 60 mg  60 mg Subcutaneous Q6 months Salley Scarlet, MD   60 mg at 04/08/19 0844    Allergies as of 08/30/2019 - Review Complete 08/30/2019  Allergen Reaction Noted  . Sulfa antibiotics  11/09/2018    Family History  Problem Relation Age of Onset  . Colon cancer Neg Hx     Social History   Socioeconomic History  . Marital status: Widowed    Spouse name: Not on file  . Number of children: Not on file  . Years of education: Not on file  . Highest education level: Not on file  Occupational History  . Not on file  Tobacco Use  . Smoking status: Never Smoker  . Smokeless tobacco: Never Used  Substance and Sexual Activity  . Alcohol use: Never  . Drug use: Never  . Sexual activity: Not Currently  Other Topics Concern  . Not on file  Social History Narrative  . Not on file   Social Determinants of Health   Financial Resource Strain: Low Risk   . Difficulty of Paying Living Expenses: Not hard at  all  Food Insecurity: No Food Insecurity  . Worried About Programme researcher, broadcasting/film/video in the Last Year: Never true  . Ran Out of Food in the Last Year: Never true  Transportation Needs: No Transportation Needs  . Lack of Transportation (Medical): No  . Lack of Transportation (Non-Medical): No  Physical Activity: Inactive  . Days of Exercise per Week: 0 days  . Minutes of Exercise per Session: 0 min  Stress: No Stress Concern Present  . Feeling of Stress : Not at all  Social Connections: Unknown  . Frequency of Communication with Friends and Family: More than three times a week  . Frequency of Social Gatherings with Friends and Family: More than three times a week  . Attends  Religious Services: More than 4 times per year  . Active Member of Clubs or Organizations: No  . Attends Archivist Meetings: Never  . Marital Status: Not on file    Review of Systems: Gen: Denies fever.  CV: Denies chest pain, palpitations Resp: Denies dyspnea at rest, cough.  GI: Denies vomiting blood, jaundice, and fecal incontinence.   Denies dysphagia or odynophagia. Psych: Denies depression, anxiety, memory loss, confusion. No homicidal or suicidal ideation.  Heme: Denies bruising, bleeding, and enlarged lymph nodes.  Physical Exam: BP (!) 155/75   Pulse 81   Temp (!) 97.5 F (36.4 C) (Oral)   Ht 5\' 1"  (1.549 m)   Wt 104 lb 3.2 oz (47.3 kg)   BMI 19.69 kg/m  General:   Alert and oriented. No distress noted. Pleasant and cooperative.  Head:  Normocephalic and atraumatic. Eyes:  Conjuctiva clear without scleral icterus. Mouth:  Oral mucosa pink and moist. Good dentition. No lesions. Heart:  S1, S2 present without murmurs appreciated. Lungs:  Clear to auscultation bilaterally. No wheezes, rales, or rhonchi. No distress.  Abdomen:  +BS, soft, non-tender and non-distended. No rebound or guarding. No HSM or masses noted. Msk:  Symmetrical without gross deformities. Normal posture. Extremities:   Without edema. Neurologic:  Alert and  oriented x4 Psych:  Alert and cooperative. Normal mood and affect.

## 2019-08-30 ENCOUNTER — Encounter: Payer: Self-pay | Admitting: Gastroenterology

## 2019-08-30 ENCOUNTER — Ambulatory Visit (INDEPENDENT_AMBULATORY_CARE_PROVIDER_SITE_OTHER): Payer: Medicare Other | Admitting: Gastroenterology

## 2019-08-30 ENCOUNTER — Other Ambulatory Visit: Payer: Self-pay

## 2019-08-30 VITALS — BP 155/75 | HR 81 | Temp 97.5°F | Ht 61.0 in | Wt 104.2 lb

## 2019-08-30 DIAGNOSIS — K529 Noninfective gastroenteritis and colitis, unspecified: Secondary | ICD-10-CM | POA: Diagnosis not present

## 2019-08-30 MED ORDER — CHOLESTYRAMINE 4 G PO PACK: 4.0000 g | PACK | Freq: Every day | ORAL | 5 refills | Status: DC

## 2019-08-30 NOTE — Assessment & Plan Note (Addendum)
86 year old female with history of chronic diarrhea who moved from [place] in March 2020 to live locally with her daughter.  Previously diagnosed with IBS in [place].  Initially seen in our office 05/20/2019 reporting 3-5 mushy stools about 3 days a week, otherwise with 1-2 soft formed stools.  Suspected lactose intolerance, bile salt diarrhea, and possible IBS as etiologies.  Celiac screen was negative.  She has been following a lactose-free diet and taking Tums prior to at least 2 meals a day and has noted improvement in her bowel function.  Currently having 1-2 soft to mushy BMs daily.  No watery stools.  Occasional bad days with 3 mushy stools.  Most recent occurrence was after fried fish.  Using Imodium maybe once every other week.  No bright red blood per rectum, melena, abdominal pain, or unintentional weight loss.  Received colonoscopy report from [facility] dated 03/10/2006.  Exam revealed severe diverticulosis, otherwise normal exam.  Noted that she was well maintained on Questran and Metamucil at that time for intermittent diarrhea.  Again suspect dietary intolerances, bile salt diarrhea, and possible component of IBS are contributing to diarrhea.  Symptoms have definitely improved with dietary changes which is encouraging.  Suspect there may still be some room for improvement.  As she did well with Questran in the past, discussed this is an option with patient and her daughter, and they are interested in trying this again.   Start Questran 4 g once daily with a meal.  Advised that oral medication should be taken at least 1 hour before or 4-6 hours after Questran. Stop Tums while taking Questran. *Of note, she is on levothyroxine and has follow-up with PCP later this month and will have TSH rechecked.  Advised to let her PCP know the Lanetta Inch has been started to ensure this is not affecting levothyroxine. Advise she continue following a lactose-free diet or take Lactaid tablets prior to  consuming any dairy product. Continue following a low-fat diet.  Avoid fried, fatty, greasy foods.  All meats should be lean and baked, boiled, or broiled. Keep a log of bowel habits.  Specifically, requested she record days her bowel movements are more frequent as well as foods eaten prior to onset. Follow-up in 4 months.  Call with questions or concerns prior.

## 2019-08-30 NOTE — Assessment & Plan Note (Deleted)
86 year old female with history of chronic diarrhea he moved from [place] in March 2020 to live locally with her daughter.  Previously diagnosed with IBS in [place].  Initially seen in our office 05/20/2019 reporting 3-5 mushy stools about 3 days a week, otherwise with 1-2 soft formed stools.  Suspected lactose intolerance, bile salt diarrhea, and possible IBS as etiologies.  Celiac screen was negative.  She has been following a lactose-free diet and taking Tums prior to at least 2 meals a day and has noted improvement in her bowel function.  Currently having 1-2 soft to mushy BMs daily.  No watery stools.  Occasional bad days with 3 mushy stools.  Most recent occurrence was after fried fish.  Using Imodium maybe once every other week.  No bright red blood per rectum, melena, unintentional weight loss.  Received colonoscopy report from [facility] dated 03/10/2006.  Exam revealed severe diverticulosis, otherwise normal exam.  Noted that she was well maintained on Questran and Metamucil at that time for intermittent diarrhea.  Again suspect dietary intolerances, bile salt diarrhea, and possible component of IBS are contributing to diarrhea.  Symptoms have definitely improved with dietary changes which is encouraging.  Suspect there may still be some room for improvement.  As she did well with Questran in the past, discussed this is an option with patient and her daughter and they are interested in trying this again.   Start Questran 4 g once daily with a meal.  Advised that oral medication should be taken at least 1 hour before or 4-6 hours after Questran. Stop Tums while taking Questran. *Of note, she is on levothyroxine and has follow-up with PCP later this month and will have TSH rechecked.  Advised to let her PCP know the Lanetta Inch has been started to ensure this is not affecting levothyroxine. Advise she continue following a lactose-free diet or take Lactaid tablets prior to consuming any dairy  product. Continue following a low-fat diet.  Avoid fried, fatty, greasy foods.  All meats should be lean and baked, boiled, or broiled. Keep a log of bowel habits.  Specifically, requested she record days her bowel movements are more frequent as well as foods eaten prior to onset. Follow-up in 4 months.  Call with questions or concerns prior.

## 2019-08-30 NOTE — Patient Instructions (Addendum)
We will try you on Questran 4 g daily with a meal.  This is a powder that you will dissolve into water or other beverage. Administer other oral medications ?1 hour before or 4 to 6 hours after Questran.  You may stop Tums while taking Questran.  Continue following a lactose-free diet. If you are going to consume some sort of dairy product, I recommend you take Lactaid tablets prior.  Continue following a low-fat diet.  Avoid fried, fatty, greasy foods. All meats should be lean (poultry or fish) and baked, boiled, or broiled.  Keep a log of bowel habits.  Specifically, keep a log of the days you have more frequent stools and record what was eaten prior to this.   Keep your follow-up appointment with your primary care later this month.  Let them know you have been started on Questran.  They will need to continue to monitor your TSH to ensure the medication is not interacting with your levothyroxine.  We will plan to follow-up with you in 4 months.  Call with questions or concerns prior.  Ermalinda Memos, PA-C [facility]

## 2019-08-31 ENCOUNTER — Encounter: Payer: Self-pay | Admitting: Gastroenterology

## 2019-09-08 ENCOUNTER — Telehealth: Payer: Self-pay | Admitting: Gastroenterology

## 2019-09-08 NOTE — Telephone Encounter (Signed)
[phone]  Please call patient [name], she has a question about her mothers medication

## 2019-09-08 NOTE — Telephone Encounter (Signed)
Spoke to pts daughter carol. She verified pts name and dob Notified her that  rx was sent into [facility] pharmacy here in [place]  and pharmacy stated it is ready for pick up it is $42.43

## 2019-09-13 ENCOUNTER — Other Ambulatory Visit: Payer: Self-pay | Admitting: Family Medicine

## 2019-09-13 MED ORDER — DONEPEZIL HCL 5 MG PO TABS: 5.0000 mg | ORAL_TABLET | Freq: Every day | ORAL | 5 refills | Status: DC

## 2019-09-28 ENCOUNTER — Other Ambulatory Visit: Payer: Self-pay | Admitting: Family Medicine

## 2019-10-15 ENCOUNTER — Ambulatory Visit (INDEPENDENT_AMBULATORY_CARE_PROVIDER_SITE_OTHER): Payer: Medicare Other | Admitting: Family Medicine

## 2019-10-15 ENCOUNTER — Other Ambulatory Visit: Payer: Self-pay

## 2019-10-15 ENCOUNTER — Encounter: Payer: Self-pay | Admitting: Family Medicine

## 2019-10-15 VITALS — BP 128/66 | HR 78 | Temp 97.2°F | Resp 14 | Ht 61.0 in | Wt 103.0 lb

## 2019-10-15 DIAGNOSIS — I10 Essential (primary) hypertension: Secondary | ICD-10-CM | POA: Diagnosis not present

## 2019-10-15 DIAGNOSIS — E782 Mixed hyperlipidemia: Secondary | ICD-10-CM | POA: Diagnosis not present

## 2019-10-15 DIAGNOSIS — R197 Diarrhea, unspecified: Secondary | ICD-10-CM | POA: Diagnosis not present

## 2019-10-15 DIAGNOSIS — E039 Hypothyroidism, unspecified: Secondary | ICD-10-CM

## 2019-10-15 DIAGNOSIS — M816 Localized osteoporosis [Lequesne]: Secondary | ICD-10-CM

## 2019-10-15 DIAGNOSIS — M810 Age-related osteoporosis without current pathological fracture: Secondary | ICD-10-CM | POA: Diagnosis not present

## 2019-10-15 DIAGNOSIS — F0150 Vascular dementia without behavioral disturbance: Secondary | ICD-10-CM

## 2019-10-15 DIAGNOSIS — K529 Noninfective gastroenteritis and colitis, unspecified: Secondary | ICD-10-CM

## 2019-10-15 MED ORDER — DONEPEZIL HCL 5 MG PO TABS: 5.0000 mg | ORAL_TABLET | Freq: Every day | ORAL | 3 refills | Status: DC

## 2019-10-15 NOTE — Progress Notes (Signed)
Subjective:    Patient ID: Dawn Conner, female    DOB: 03-Dec-1927, 86 y.o.   MRN: [id number]  Patient presents for Follow-up (is not fasting) and Injections (Prolia)  Patient here with her daughter.  She has been doing quite well.  She is active she walks daily with her dog  Her appetite is good she is maintaining her weight over 100 pounds.  She is taking all of her medications as prescribed    Osteoporosis- she is on prolia injections   Hypothyrodism -  Taking levothyroxine due to recheck   HTN- lisnpril 5mg  , norvasc 5mg   Bp has controlled, no dizziness    Hyperlipidemia taking statin without any SE   DEmetni- taking aricept 5mg , she walks daily, pandemic has been difficult as she has not been able to get out of the home very much , she does read some   Dr. - EYE DOCTOR scheduled    Review Of Systems:  GEN- denies fatigue, fever, weight loss,weakness, recent illness HEENT- denies eye drainage, change in vision, nasal discharge, CVS- denies chest pain, palpitations RESP- denies SOB, cough, wheeze ABD- denies N/V, change in stools, abd pain GU- denies dysuria, hematuria, dribbling, incontinence MSK- denies joint pain, muscle aches, injury Neuro- denies headache, dizziness, syncope, seizure activity       Objective:    BP 128/66   Pulse 78   Temp (!) 97.2 F (36.2 C) (Temporal)   Resp 14   Ht 5\' 1"  (1.549 m)   Wt 103 lb (46.7 kg)   SpO2 98%   BMI 19.46 kg/m  GEN- NAD, alert and oriented x3 HEENT- PERRL, EOMI, non injected sclera, pink conjunctiva, MMM, oropharynx clear Neck- Supple, no thyromegaly CVS- RRR, no murmur RESP-CTAB ABD-NABS,soft,NT,ND Psych- normal affect and mood, very pleasant  EXT- No edema Pulses- Radial, DP- 2+        Assessment & Plan:      Problem List Items Addressed This Visit      Unprioritized   Chronic diarrhea    Reviewed GI note We discussed ways to administer the questran She does not like the  taste      Diarrhea   Relevant Orders   CBC with Differential/Platelet (Completed)   Hyperlipidemia   Relevant Orders   CBC with Differential/Platelet (Completed)   Hypertension    Controlled no changes to meds       Relevant Orders   Comprehensive metabolic panel (Completed)   CBC with Differential/Platelet (Completed)   Hypothyroidism    Check TFT due topossible interference with questran  Adjust as needed       Relevant Orders   TSH (Completed)   T3, free (Completed)   CBC with Differential/Platelet (Completed)   Osteoporosis - Primary    Continue prolia, vitamin D/Calcium Last Bone Density in 2018 Plan for repeat this fall       Relevant Orders   CBC with Differential/Platelet (Completed)   Vascular dementia (HCC)    Likley mixed cognitive pic, but MRI showed history of CVA in the past contributing to memory problems as well, she has done well on Ariept low dose, did not change her diarrhea,   Will [facility] about [facility] during the week       Relevant Medications   donepezil (ARICEPT) 5 MG tablet      Note: This dictation was prepared with Dragon dictation along with smaller phrase technology. Any transcriptional errors that result from this process are unintentional.

## 2019-10-15 NOTE — Patient Instructions (Addendum)
F/U 6 months Physical and Prolia

## 2019-10-16 LAB — COMPREHENSIVE METABOLIC PANEL
AG Ratio: 1.3 (calc) (ref 1.0–2.5)
ALT: 17 U/L (ref 6–29)
AST: 26 U/L (ref 10–35)
Albumin: 4 g/dL (ref 3.6–5.1)
Alkaline phosphatase (APISO): 61 U/L (ref 37–153)
BUN/Creatinine Ratio: 29 (calc) — ABNORMAL HIGH (ref 6–22)
BUN: 38 mg/dL — ABNORMAL HIGH (ref 7–25)
CO2: 27 mmol/L (ref 20–32)
Calcium: 9.5 mg/dL (ref 8.6–10.4)
Chloride: 105 mmol/L (ref 98–110)
Creat: 1.33 mg/dL — ABNORMAL HIGH (ref 0.60–0.88)
Globulin: 3.2 g/dL (calc) (ref 1.9–3.7)
Glucose, Bld: 150 mg/dL — ABNORMAL HIGH (ref 65–99)
Potassium: 4.7 mmol/L (ref 3.5–5.3)
Sodium: 139 mmol/L (ref 135–146)
Total Bilirubin: 0.4 mg/dL (ref 0.2–1.2)
Total Protein: 7.2 g/dL (ref 6.1–8.1)

## 2019-10-16 LAB — CBC WITH DIFFERENTIAL/PLATELET
Absolute Monocytes: 710 cells/uL (ref 200–950)
Basophils Absolute: 37 cells/uL (ref 0–200)
Basophils Relative: 0.5 %
Eosinophils Absolute: 59 cells/uL (ref 15–500)
Eosinophils Relative: 0.8 %
HCT: 40.7 % (ref 35.0–45.0)
Hemoglobin: 12.7 g/dL (ref 11.7–15.5)
Lymphs Abs: 1769 cells/uL (ref 850–3900)
MCH: 27.1 pg (ref 27.0–33.0)
MCHC: 31.2 g/dL — ABNORMAL LOW (ref 32.0–36.0)
MCV: 87 fL (ref 80.0–100.0)
MPV: 11.5 fL (ref 7.5–12.5)
Monocytes Relative: 9.6 %
Neutro Abs: 4825 cells/uL (ref 1500–7800)
Neutrophils Relative %: 65.2 %
Platelets: 233 10*3/uL (ref 140–400)
RBC: 4.68 10*6/uL (ref 3.80–5.10)
RDW: 13.4 % (ref 11.0–15.0)
Total Lymphocyte: 23.9 %
WBC: 7.4 10*3/uL (ref 3.8–10.8)

## 2019-10-16 LAB — T3, FREE: T3, Free: 2.9 pg/mL (ref 2.3–4.2)

## 2019-10-16 LAB — TSH: TSH: 0.85 mIU/L (ref 0.40–4.50)

## 2019-10-17 DIAGNOSIS — F0150 Vascular dementia without behavioral disturbance: Secondary | ICD-10-CM | POA: Insufficient documentation

## 2019-10-17 NOTE — Assessment & Plan Note (Addendum)
Likley mixed cognitive pic, but MRI showed history of CVA in the past contributing to memory problems as well, she has done well on Ariept low dose, did not change her diarrhea,   Will give info about [facility] during the week

## 2019-10-17 NOTE — Assessment & Plan Note (Signed)
Controlled no changes to meds 

## 2019-10-17 NOTE — Assessment & Plan Note (Addendum)
Continue prolia, vitamin D/Calcium Last Bone Density in 2018 Plan for repeat this fall

## 2019-10-17 NOTE — Assessment & Plan Note (Signed)
Check TFT due topossible interference with Lanetta Inch  Adjust as needed

## 2019-10-17 NOTE — Assessment & Plan Note (Signed)
Reviewed GI note We discussed ways to administer the questran She does not like the taste

## 2019-10-20 ENCOUNTER — Encounter: Payer: Self-pay | Admitting: Family Medicine

## 2019-11-11 ENCOUNTER — Other Ambulatory Visit: Payer: Self-pay

## 2019-11-11 ENCOUNTER — Other Ambulatory Visit: Payer: Medicare Other

## 2019-11-11 DIAGNOSIS — N183 Chronic kidney disease, stage 3 unspecified: Secondary | ICD-10-CM

## 2019-11-11 LAB — BASIC METABOLIC PANEL WITH GFR
BUN/Creatinine Ratio: 21 (calc) (ref 6–22)
BUN: 29 mg/dL — ABNORMAL HIGH (ref 7–25)
CO2: 26 mmol/L (ref 20–32)
Calcium: 9.2 mg/dL (ref 8.6–10.4)
Chloride: 105 mmol/L (ref 98–110)
Creat: 1.35 mg/dL — ABNORMAL HIGH (ref 0.60–0.88)
GFR, Est African American: 40 mL/min/{1.73_m2} — ABNORMAL LOW (ref >=60)
GFR, Est Non African American: 34 mL/min/{1.73_m2} — ABNORMAL LOW (ref >=60)
Glucose, Bld: 94 mg/dL (ref 65–99)
Potassium: 4.7 mmol/L (ref 3.5–5.3)
Sodium: 140 mmol/L (ref 135–146)

## 2019-12-23 ENCOUNTER — Other Ambulatory Visit: Payer: Self-pay | Admitting: *Deleted

## 2019-12-23 MED ORDER — AMLODIPINE BESYLATE 5 MG PO TABS: 5.0000 mg | ORAL_TABLET | Freq: Every day | ORAL | 3 refills | Status: DC

## 2019-12-29 ENCOUNTER — Other Ambulatory Visit: Payer: Self-pay | Admitting: Family Medicine

## 2019-12-30 MED ORDER — LEVOTHYROXINE SODIUM 75 MCG PO TABS: 75.0000 ug | ORAL_TABLET | Freq: Every day | ORAL | 1 refills | Status: DC

## 2020-01-05 ENCOUNTER — Encounter: Payer: Self-pay | Admitting: Family Medicine

## 2020-01-05 ENCOUNTER — Ambulatory Visit (INDEPENDENT_AMBULATORY_CARE_PROVIDER_SITE_OTHER): Payer: Medicare Other | Admitting: Family Medicine

## 2020-01-05 ENCOUNTER — Ambulatory Visit (HOSPITAL_COMMUNITY)
Admission: RE | Admit: 2020-01-05 | Discharge: 2020-01-05 | Disposition: A | Discharge status: Home / Self Care | Payer: Medicare Other | Source: Ambulatory Visit | Attending: Family Medicine | Admitting: Family Medicine

## 2020-01-05 ENCOUNTER — Ambulatory Visit: Payer: Medicare Other | Admitting: Gastroenterology

## 2020-01-05 ENCOUNTER — Other Ambulatory Visit: Payer: Self-pay

## 2020-01-05 VITALS — BP 130/64 | HR 70 | Temp 99.2°F | Resp 14 | Ht 61.0 in | Wt 103.0 lb

## 2020-01-05 DIAGNOSIS — S0093XA Contusion of unspecified part of head, initial encounter: Secondary | ICD-10-CM

## 2020-01-05 DIAGNOSIS — M545 Low back pain, unspecified: Secondary | ICD-10-CM

## 2020-01-05 DIAGNOSIS — W19XXXA Unspecified fall, initial encounter: Secondary | ICD-10-CM

## 2020-01-05 DIAGNOSIS — Z23 Encounter for immunization: Secondary | ICD-10-CM | POA: Diagnosis not present

## 2020-01-05 DIAGNOSIS — M25561 Pain in right knee: Secondary | ICD-10-CM

## 2020-01-05 DIAGNOSIS — S0083XA Contusion of other part of head, initial encounter: Secondary | ICD-10-CM | POA: Insufficient documentation

## 2020-01-05 DIAGNOSIS — I6782 Cerebral ischemia: Secondary | ICD-10-CM | POA: Diagnosis not present

## 2020-01-05 DIAGNOSIS — M25562 Pain in left knee: Secondary | ICD-10-CM

## 2020-01-05 DIAGNOSIS — F0150 Vascular dementia without behavioral disturbance: Secondary | ICD-10-CM

## 2020-01-05 NOTE — Progress Notes (Signed)
Subjective:    Patient ID: Dawn Conner, female    DOB: 03/28/1928, 86 y.o.   MRN: [id number]  Patient presents for Forehead Lump (S/P fall x1 month prior- discoloration to forehead, but now has noted hard lump under skin in hair line)  Patient here with her daughter.  She had a fall a few weeks ago.  They were out of town and she got up in the middle the night to go the bathroom.  They believe that she hit her head on the side of the door frame but then fell down to the ground.  She initially had bruising and some swelling on the left forehead but bruising is starting to fade but she continues to have a swelling that is mildly tender.  She is also had some mild headaches.  She also complains of knee pain she has some bruising on both knees but no effusion.  States occasionally her back hurts.  Her daughter states that she does not like to complain about many things.  They have been giving her Tylenol.  Did not notice any change in her gait or her speech or personality since the fall.  Review Of Systems:  GEN- denies fatigue, fever, weight loss,weakness, recent illness HEENT- denies eye drainage, change in vision, nasal discharge, CVS- denies chest pain, palpitations RESP- denies SOB, cough, wheeze ABD- denies N/V, change in stools, abd pain GU- denies dysuria, hematuria, dribbling, incontinence MSK-+ joint pain, muscle aches, injury Neuro- +headache, no dizziness, syncope, seizure activity       Objective:    BP 130/64   Pulse 70   Temp 99.2 F (37.3 C) (Temporal)   Resp 14   Ht 5\' 1"  (1.549 m)   Wt 103 lb (46.7 kg)   SpO2 99%   BMI 19.46 kg/m  GEN- NAD, alert and oriented x3 HEENT- PERRL, EOMI, non injected sclera, pink conjunctiva, MMM, oropharynx clear ,left foreahead swelling with puslating vessel and yellow bruising beneath, mild TTP, no swelling noted along any sutiure lines  Neck- Supple, no thyromegaly CVS- RRR, no murmur RESP-CTAB ABD-NABS,soft,NT,ND Psych- normal  affect and mood, very pleasant  NEUROCNII-XII in tact , no focal deficits MSK- fair ROM knees, bruising ant left left, TTP right patella, mild creptus, fair ROM hips TTP lumbar spine  EXT- No edema Pulses- Radial, DP- 2+       Assessment & Plan:      Problem List Items Addressed This Visit      Unprioritized   Vascular dementia (HCC)    Other Visit Diagnoses    Traumatic hematoma of head, initial encounter    -  Primary   obtain CT of head, seems to have swelling bruising around supericial vein on forehead but r/o any bleed /hematoma since mild HA   Relevant Orders   CT Head Wo Contrast (Completed)   Accidental fall, initial encounter       Relevant Orders   CT Head Wo Contrast (Completed)   DG Knee Complete 4 Views Right   DG Knee Complete 4 Views Left   Acute pain of both knees       obtain xray, high risk for fracture   Relevant Orders   DG Knee Complete 4 Views Right   DG Knee Complete 4 Views Left   Lumbar back pain       worsening back pain since fall, obtain xray, tylenol forpain   Relevant Orders   DG Lumbar Spine Complete      Note:  This dictation was prepared with Dragon dictation along with smaller phrase technology. Any transcriptional errors that result from this process are unintentional.

## 2020-01-05 NOTE — Patient Instructions (Signed)
CT scan to be done  

## 2020-02-22 NOTE — Progress Notes (Signed)
Referring Provider: Salley Scarlet, MD Primary Care Physician:  Salley Scarlet, MD Primary GI Physician: Dr. Marletta Lor  Chief Complaint  Patient presents with  . Diarrhea    HPI:   Dawn Conner is a 86 y.o. female with history of HTN, HLD, renal insufficiency, hypothyroidism, osteoporosis, and chronic diarrhea presenting today for follow-up of chronic diarrhea.  Previously diagnosed with IBS while living in [place].  Moved locally to live with a daughter in March 2020 and has been following with [facility] since January 2021.  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  Celiac screen was negative.  Last colonoscopy in 2007 in [place] with diarrhea present at that time.  Findings included severe diverticulosis, otherwise no significant findings.  Last seen in our office 08/30/2019.  At the time of her last visit, she was following a lactose-free diet and taking Tums with some meals when she remembered.  Diarrhea had improved with 1-2 soft BMs daily.  No watery BMs.  Occasional "bad days" maybe once or twice a month with 3 mushy stools daily.  Most recent occurrence was after eating fried fish.  Take an Imodium once every other week if patient complains about frequent BMs to daughter.  Notably, daughter states she is not always sure if she is having frequent stools but will go ahead and give patient 1 Imodium.  No alarm symptoms.  No upper GI symptoms.  Patient and daughter were interested in retrying Questran as patient has been on this in .  Prescription for Questran 4 g once daily sent to pharmacy.  Advised to stop taking Tums while taking Questran.  She had upcoming follow-up with PCP and I advised her to let them know that she was started on Questran to ensure this was not affecting levothyroxine.  Also advise she continue lactose-free diet and low-fat diet.   Follow-up in 4 months.  Today:  Overall diarrhea is well controlled. Had a couple episodes that were triggered by a blooming onion and stuffed peppers. Otherwise, no diarrhea. Usually 1-2 BMs daily. Still following a lactose free diet. Still taking tums before meals. Not taking imodium. Never ended up taking the [place]. Patient did not want to take it. No blood in the stool or black stools. Weight is stable.   On the way here, which was shortly after a BM, daughter reports patient complained of some right-sided abdominal pain.  Patient denies any abdominal pain currently.  States it came and went pretty quickly.  Denies any abdominal pain typically.    No nausea or vomiting, GERD symptoms, or dysphagia.    Past Medical History:  Diagnosis Date  . Dementia (HCC)   . Diverticulosis   . Hyperlipidemia   . Hypertension   . Osteoporosis   . Oth fracture of shaft of right humerus, init for opn fx 1990's  . Renal insufficiency   . Thyroid disease     Past Surgical History:  Procedure Laterality Date  . ABDOMINAL HYSTERECTOMY  1972  . APPENDECTOMY    . cataract surgery     Early 2000's both eyes   . CHOLECYSTECTOMY  1980's  . COLONOSCOPY  03/10/2006   Severe diverticulosis especially involving the sigmoid and to a lesser extent all other segments as well.  There was no evidence of neoplastic pathology. Recommended continuing Metamucil as well as Questran which appeared to keep her fairly regular. Consider  repeat in 5 years.   . OVARIAN CYST REMOVAL  09/2012  . TONSILLECTOMY  age 22    Current Outpatient Medications  Medication Sig Dispense Refill  . amLODipine (NORVASC) 5 MG tablet Take 1 tablet (5 mg total) by mouth daily. 90 tablet 3  . aspirin EC 81 MG tablet Take 81 mg by mouth daily.    . calcium carbonate (TUMS - DOSED IN MG ELEMENTAL CALCIUM) 500 MG chewable tablet Chew 1 tablet by mouth. 1-2 meals per day    . Cholecalciferol (VITAMIN D) 50 MCG (2000 UT) tablet Take 4,000  Units by mouth daily.    Marland Kitchen donepezil (ARICEPT) 5 MG tablet Take 1 tablet (5 mg total) by mouth at bedtime. 90 tablet 3  . levothyroxine (EUTHYROX) 75 MCG tablet Take 1 tablet (75 mcg total) by mouth daily before breakfast. 90 tablet 1  . loperamide (IMODIUM) 2 MG capsule Take by mouth as needed for diarrhea or loose stools.    . Multiple Minerals-Vitamins (CALCIUM-MAGNESIUM-ZINC-D3 PO) Take 1-2 tablets by mouth daily.    . simvastatin (ZOCOR) 40 MG tablet Take 1 tablet (40 mg total) by mouth daily. 90 tablet 3  . lisinopril (ZESTRIL) 10 MG tablet Take 1 tablet (10 mg total) by mouth daily. (Patient not taking: Reported on 02/23/2020)     Current Facility-Administered Medications  Medication Dose Route Frequency Provider Last Rate Last Admin  . denosumab (PROLIA) injection 60 mg  60 mg Subcutaneous Q6 months Salley Scarlet, MD   60 mg at 10/15/19 1543    Allergies as of 02/23/2020 - Review Complete 02/23/2020  Allergen Reaction Noted  . Sulfa antibiotics  11/09/2018    Family History  Problem Relation Age of Onset  . Colon cancer Neg Hx     Social History   Socioeconomic History  . Marital status: Widowed    Spouse name: Not on file  . Number of children: Not on file  . Years of education: Not on file  . Highest education level: Not on file  Occupational History  . Not on file  Tobacco Use  . Smoking status: Never Smoker  . Smokeless tobacco: Never Used  Vaping Use  . Vaping Use: Never used  Substance and Sexual Activity  . Alcohol use: Never  . Drug use: Never  . Sexual activity: Not Currently  Other Topics Concern  . Not on file  Social History Narrative  . Not on file   Social Determinants of Health   Financial Resource Strain:   . Difficulty of Paying Living Expenses: Not on file  Food Insecurity:   . Worried About Programme researcher, broadcasting/film/video in the Last Year: Not on file  . Ran Out of Food in the Last Year: Not on file  Transportation Needs:   . Lack of  Transportation (Medical): Not on file  . Lack of Transportation (Non-Medical): Not on file  Physical Activity:   . Days of Exercise per Week: Not on file  . Minutes of Exercise per Session: Not on file  Stress:   . Feeling of Stress : Not on file  Social Connections:   . Frequency of Communication with Friends and Family: Not on file  . Frequency of Social Gatherings with Friends and Family: Not on file  . Attends Religious Services: Not on file  . Active Member of Clubs or Organizations: Not on file  . Attends Banker Meetings: Not on file  . Marital Status: Not on file  Review of Systems: Gen: Denies fever, chills, cold or flulike symptoms, lightheadedness, dizziness, presyncope, syncope. CV: Denies chest pain or palpitations. Resp: Denies dyspnea or cough. GI: See HPI Heme: See HPI  Physical Exam: BP 124/82   Pulse 95   Temp (!) 96.6 F (35.9 C) (Temporal)   Ht 5\' 1"  (1.549 m)   Wt 102 lb 6.4 oz (46.4 kg)   BMI 19.35 kg/m  General:   Alert and oriented. No distress noted. Pleasant and cooperative.  Thin. Head:  Normocephalic and atraumatic. Eyes:  Conjuctiva clear without scleral icterus. Heart:  S1, S2 present without murmurs appreciated. Lungs:  Clear to auscultation bilaterally. No wheezes, rales, or rhonchi. No distress.  Abdomen:  +BS, soft, non-tender and non-distended. No rebound or guarding. No HSM or masses noted. Msk:  Symmetrical without gross deformities. Normal posture. Extremities:  Without edema. Neurologic:  Alert and  oriented x4 Psych: Normal mood and affect.

## 2020-02-23 ENCOUNTER — Ambulatory Visit (INDEPENDENT_AMBULATORY_CARE_PROVIDER_SITE_OTHER): Payer: Medicare Other | Admitting: Gastroenterology

## 2020-02-23 ENCOUNTER — Encounter: Payer: Self-pay | Admitting: Gastroenterology

## 2020-02-23 ENCOUNTER — Other Ambulatory Visit: Payer: Self-pay

## 2020-02-23 VITALS — BP 124/82 | HR 95 | Temp 96.6°F | Ht 61.0 in | Wt 102.4 lb

## 2020-02-23 DIAGNOSIS — K529 Noninfective gastroenteritis and colitis, unspecified: Secondary | ICD-10-CM

## 2020-02-23 DIAGNOSIS — R197 Diarrhea, unspecified: Secondary | ICD-10-CM

## 2020-02-23 NOTE — Patient Instructions (Signed)
Please continue following a lactose-free diet.  Be sure to follow a low-fat diet.  Avoid fried, fatty, greasy foods. Meats should be lean (poultry or fish) and baked, boiled, or broiled.  As you are not consuming much protein, I recommend you add protein shakes at least 1-2 daily.  You may continue taking Tums before meals.  We will plan to see back in 6 months. Do not hesitate to call if you have questions or concerns prior.  It was great seeing you today! I am glad you are feeling well!  Ermalinda Memos, PA-C [facility]

## 2020-02-25 ENCOUNTER — Encounter: Payer: Self-pay | Admitting: Gastroenterology

## 2020-02-25 NOTE — Assessment & Plan Note (Signed)
86 year old female with history of chronic diarrhea likely secondary to lactose intolerance and bile salt diarrhea s/p cholecystectomy.  Symptoms are much improved with following a lactose-free diet and taking Tums before meals.  Occasional flares of diarrhea caused by greasy or spicy foods.  No alarm symptoms.  Had previously plan to try Questran, but patient decided she did not want to take this medication.  Plan: Continue following a lactose-free diet. Continue tums before meals.  Follow a low-fat diet.  Avoid fried, fatty, greasy foods. Meats should be lean (poultry or fish) and baked, boiled, or broiled. As she does not consume much protein on a daily basis, I recommended she add protein shakes 1-2 times daily.  Follow-up in 6 months.

## 2020-02-28 NOTE — Progress Notes (Signed)
Cc'ed to pcp °

## 2020-02-29 NOTE — Progress Notes (Signed)
Cc'ed to pcp °

## 2020-03-11 ENCOUNTER — Other Ambulatory Visit: Payer: Self-pay | Admitting: Family Medicine

## 2020-04-17 ENCOUNTER — Ambulatory Visit (INDEPENDENT_AMBULATORY_CARE_PROVIDER_SITE_OTHER): Payer: Medicare Other | Admitting: Family Medicine

## 2020-04-17 ENCOUNTER — Encounter: Payer: Self-pay | Admitting: Family Medicine

## 2020-04-17 ENCOUNTER — Ambulatory Visit: Payer: Medicare Other | Admitting: Family Medicine

## 2020-04-17 ENCOUNTER — Other Ambulatory Visit: Payer: Self-pay

## 2020-04-17 VITALS — BP 128/80 | HR 77 | Temp 97.8°F | Wt 103.0 lb

## 2020-04-17 DIAGNOSIS — M810 Age-related osteoporosis without current pathological fracture: Secondary | ICD-10-CM | POA: Diagnosis not present

## 2020-04-17 DIAGNOSIS — I10 Essential (primary) hypertension: Secondary | ICD-10-CM | POA: Diagnosis not present

## 2020-04-17 DIAGNOSIS — Z0000 Encounter for general adult medical examination without abnormal findings: Secondary | ICD-10-CM

## 2020-04-17 DIAGNOSIS — F0150 Vascular dementia without behavioral disturbance: Secondary | ICD-10-CM

## 2020-04-17 DIAGNOSIS — E039 Hypothyroidism, unspecified: Secondary | ICD-10-CM

## 2020-04-17 DIAGNOSIS — Z0001 Encounter for general adult medical examination with abnormal findings: Secondary | ICD-10-CM | POA: Diagnosis not present

## 2020-04-17 DIAGNOSIS — E782 Mixed hyperlipidemia: Secondary | ICD-10-CM

## 2020-04-17 DIAGNOSIS — M816 Localized osteoporosis [Lequesne]: Secondary | ICD-10-CM

## 2020-04-17 DIAGNOSIS — Z0184 Encounter for antibody response examination: Secondary | ICD-10-CM

## 2020-04-17 NOTE — Patient Instructions (Addendum)
F/U 6 months  Bone Density next year  We will call with lab results

## 2020-04-17 NOTE — Progress Notes (Signed)
Subjective:   Patient presents for Medicare Annual/Subsequent preventive examination.   HTN- taking norvasc , she has TAPERED off lisinopril and BP has remained at goal   Hypothyroidism taking levothyroixine without any difficulty   HLD- taking zocor  Vitamin D/calcium/MVI - due for Prolia    Chronic diarrhea, controlled with imodium and dietary changes, followed by GI  Review Past Medical/Family/Social: per EMR    Risk Factors  Current exercise habits:  None  Dietary issues discussed: maintaining weight as uch as possible   Cardiac risk factors: HTN   Depression Screen - no concerns has dementia   (Note: if answer to either of the following is "Yes", a more complete depression screening is indicated)  Over the past two weeks, have you felt down, depressed or hopeless? No Over the past two weeks, have you felt little interest or pleasure in doing things? No Have you lost interest or pleasure in daily life? No Do you often feel hopeless? No Do you cry easily over simple problems? No   Activities of Daily Living  In your present state of health, do you have any difficulty performing the following activities?:  Driving? No  Managing money? No  Feeding yourself? No  Getting from bed to chair? No  Climbing a flight of stairs? No  Preparing food and eating?: No  Bathing or showering? No  Getting dressed: No  Getting to the toilet? No  Using the toilet:No  Moving around from place to place: No  In the past year have you fallen or had a near fall?:No  Are you sexually active? No  Do you have more than one partner? No   Hearing Difficulties: No  Do you often ask people to speak up or repeat themselves? No  Do you experience ringing or noises in your ears? No Do you have difficulty understanding soft or whispered voices? No  Do you feel that you have a problem with memory? Yes  Do you often misplace items? No  Do you feel safe at home? Yes  Cognitive Testing  VASCULAR  DEMENTIA  Alert? Yes Normal Appearance?Yes  Oriented to person? Yes Place? Yes    List the Names of Other Physician/Practitioners you currently use:  Opthalology Dr. [facility]    Screening Tests / Date Bone Density - can defer to 2022 due to COVID-19   Influenza Vaccine UTD  Tetanus/tdap -DISCUSSED COVID-19 DISCUSSED  ROS: GEN- denies fatigue, fever, weight loss,weakness, recent illness HEENT- denies eye drainage, change in vision, nasal discharge, CVS- denies chest pain, palpitations RESP- denies SOB, cough, wheeze ABD- denies N/V, change in stools, abd pain GU- denies dysuria, hematuria, dribbling, incontinence MSK- denies joint pain, muscle aches, injury Neuro- denies headache, dizziness, syncope, seizure activity  PHYSICAL: vitals reviewed   GEN- NAD, alert and oriented x3 HEENT- PERRL, EOMI, non injected sclera, pink conjunctiva, MMM, oropharynx clear Neck- Supple, no thryomegaly , no b ruit  CVS- RRR, soft systolic  murmur RESP-CTAB ABD-NABS,soft,NT,ND EXT- No edema Pulses- Radial, DP- 2+    Assessment:    Annual wellness medicare exam   Plan:    During the course of the visit the patient was educated and counseled about appropriate screening and preventive services including:    Immunizations- discussed COVID-19 vaccine, check antibodies  TDAP can get at pharmacy  Osteoporosis- continue calcium/vitamin D, prolia every 6 months, defer bone density to 2022 in setting of covid, last done  2018  Vascular dementia on aricept, requires assistance with some ADLS's  daughter is caregiver  HLD on statin   Chronic diarrhea, seen by GI controlled with diet, immodium prn   Maintaining weight for her age, no hospital admissions, overall doing well  Daughter POA                .  Diet review for nutrition referral? Yes ____ Not Indicated __x__  Patient Instructions (the written plan) was given to the patient.  Medicare Attestation  I have  personally reviewed:  The patient's medical and social history  Their use of alcohol, tobacco or illicit drugs  Their current medications and supplements  The patient's functional ability including ADLs,fall risks, home safety risks, cognitive, and hearing and visual impairment  Diet and physical activities  Evidence for depression or mood disorders  The patient's weight, height, BMI, and visual acuity have been recorded in the chart. I have made referrals, counseling, and provided education to the patient based on review of the above and I have provided the patient with a written personalized care plan for preventive services.

## 2020-04-18 LAB — CBC WITH DIFFERENTIAL/PLATELET
Absolute Monocytes: 826 cells/uL (ref 200–950)
Basophils Absolute: 41 cells/uL (ref 0–200)
Basophils Relative: 0.5 %
Eosinophils Absolute: 122 cells/uL (ref 15–500)
Eosinophils Relative: 1.5 %
HCT: 40.8 % (ref 35.0–45.0)
Hemoglobin: 13.4 g/dL (ref 11.7–15.5)
Lymphs Abs: 1782 cells/uL (ref 850–3900)
MCH: 27.9 pg (ref 27.0–33.0)
MCHC: 32.8 g/dL (ref 32.0–36.0)
MCV: 85 fL (ref 80.0–100.0)
MPV: 11.3 fL (ref 7.5–12.5)
Monocytes Relative: 10.2 %
Neutro Abs: 5330 cells/uL (ref 1500–7800)
Neutrophils Relative %: 65.8 %
Platelets: 230 10*3/uL (ref 140–400)
RBC: 4.8 10*6/uL (ref 3.80–5.10)
RDW: 13.4 % (ref 11.0–15.0)
Total Lymphocyte: 22 %
WBC: 8.1 10*3/uL (ref 3.8–10.8)

## 2020-04-18 LAB — LIPID PANEL
Cholesterol: 190 mg/dL (ref <200)
HDL: 64 mg/dL (ref >=50)
LDL Cholesterol (Calc): 107 mg/dL (calc) — ABNORMAL HIGH
Non-HDL Cholesterol (Calc): 126 mg/dL (calc) (ref <130)
Total CHOL/HDL Ratio: 3 (calc) (ref <5.0)
Triglycerides: 93 mg/dL (ref <150)

## 2020-04-18 LAB — COMPREHENSIVE METABOLIC PANEL
AG Ratio: 1.2 (calc) (ref 1.0–2.5)
ALT: 20 U/L (ref 6–29)
AST: 27 U/L (ref 10–35)
Albumin: 4.2 g/dL (ref 3.6–5.1)
Alkaline phosphatase (APISO): 64 U/L (ref 37–153)
BUN/Creatinine Ratio: 25 (calc) — ABNORMAL HIGH (ref 6–22)
BUN: 26 mg/dL — ABNORMAL HIGH (ref 7–25)
CO2: 27 mmol/L (ref 20–32)
Calcium: 9.8 mg/dL (ref 8.6–10.4)
Chloride: 103 mmol/L (ref 98–110)
Creat: 1.02 mg/dL — ABNORMAL HIGH (ref 0.60–0.88)
Globulin: 3.4 g/dL (calc) (ref 1.9–3.7)
Glucose, Bld: 86 mg/dL (ref 65–99)
Potassium: 4.4 mmol/L (ref 3.5–5.3)
Sodium: 139 mmol/L (ref 135–146)
Total Bilirubin: 0.4 mg/dL (ref 0.2–1.2)
Total Protein: 7.6 g/dL (ref 6.1–8.1)

## 2020-04-18 LAB — SARS-COV-2 ANTIBODY(IGG)SPIKE,SEMI-QUANTITATIVE: SARS COV1 AB(IGG)SPIKE,SEMI QN: <1 index (ref <1.00)

## 2020-04-18 LAB — T3, FREE: T3, Free: 3.1 pg/mL (ref 2.3–4.2)

## 2020-04-18 LAB — T4, FREE: Free T4: 1.4 ng/dL (ref 0.8–1.8)

## 2020-04-18 LAB — TSH: TSH: 1.69 mIU/L (ref 0.40–4.50)

## 2020-06-20 ENCOUNTER — Encounter: Payer: Self-pay | Admitting: Family Medicine

## 2020-06-20 MED ORDER — AMLODIPINE BESYLATE 5 MG PO TABS
5.0000 mg | ORAL_TABLET | Freq: Every day | ORAL | 3 refills | Status: DC
Start: 2020-06-20 — End: 2020-09-25

## 2020-06-26 ENCOUNTER — Encounter: Payer: Self-pay | Admitting: Family Medicine

## 2020-06-27 ENCOUNTER — Other Ambulatory Visit: Payer: Self-pay

## 2020-06-27 MED ORDER — LEVOTHYROXINE SODIUM 75 MCG PO TABS: 75.0000 ug | ORAL_TABLET | Freq: Every day | ORAL | 1 refills | Status: DC

## 2020-07-17 ENCOUNTER — Encounter: Payer: Self-pay | Admitting: Family Medicine

## 2020-07-17 MED ORDER — DONEPEZIL HCL 5 MG PO TABS: 5.0000 mg | ORAL_TABLET | Freq: Every day | ORAL | 3 refills | Status: DC

## 2020-08-22 NOTE — Progress Notes (Signed)
Referring Provider: Salley Scarlet, MD Primary Care Physician:  Donita Brooks, MD Primary GI Physician: Dr. Marletta Lor  Chief Complaint  Patient presents with  . Diarrhea    Has more of an urge to have bm than diarrhea per daughter    HPI:   Dawn Conner is a 87 y.o. female presenting today for follow-up of chronic diarrhea suspected to be secondary to lactose intolerance and bile salt diarrhea s/p cholecystectomy.  Previously, TSH within normal limits.  Celiac screen negative.  Last seen in our office 02/23/2020.  Diarrhea was much improved with following a lactose-free diet and taking Tums before meals.  Occasional flares of diarrhea caused by greasy or spicy foods.  No alarm symptoms. She did not feel Questran was needed.  Plan to continue lactose-free diet, Tums before meals, low-fat diet, add protein shakes 1-2 times a day, follow-up in 6 months.  Today:  Only 2 occurences of diarrhea over the last 6 months which occurred after eating out. In general, she has 2 BMs per day. Taking tums before meals, primarily before dinner, and following a lactose free diet. Occasional urgency, but nothing significant.  No blood in the stools or black stools.   Denies abdominal pain, nausea, vomiting, GERD, or dysphagia.  Weight is stable.  She has actually gained 2 pounds over the last 6 months.  No NSAIDs.   Past Medical History:  Diagnosis Date  . Dementia (HCC)   . Diverticulosis   . Hyperlipidemia   . Hypertension   . Osteoporosis   . Oth fracture of shaft of right humerus, init for opn fx 1990's  . Renal insufficiency   . Thyroid disease     Past Surgical History:  Procedure Laterality Date  . ABDOMINAL HYSTERECTOMY  1972  . APPENDECTOMY    . cataract surgery     Early 2000's both eyes   . CHOLECYSTECTOMY  1980's  . COLONOSCOPY  03/10/2006   Severe diverticulosis especially involving the sigmoid and to a lesser extent all other segments as well.  There was no evidence of  neoplastic pathology. Recommended continuing Metamucil as well as Questran which appeared to keep her fairly regular. Consider repeat in 5 years.   . OVARIAN CYST REMOVAL  09/2012  . TONSILLECTOMY  age 72    Current Outpatient Medications  Medication Sig Dispense Refill  . amLODipine (NORVASC) 5 MG tablet Take 1 tablet (5 mg total) by mouth daily. 90 tablet 3  . aspirin EC 81 MG tablet Take 81 mg by mouth daily.    . calcium carbonate (TUMS - DOSED IN MG ELEMENTAL CALCIUM) 500 MG chewable tablet Chew 1 tablet by mouth. 1-2 meals per day    . donepezil (ARICEPT) 5 MG tablet Take 1 tablet (5 mg total) by mouth at bedtime. 90 tablet 3  . levothyroxine (EUTHYROX) 75 MCG tablet Take 1 tablet (75 mcg total) by mouth daily before breakfast. 90 tablet 1  . loperamide (IMODIUM) 2 MG capsule Take by mouth as needed for diarrhea or loose stools.    . Multiple Minerals-Vitamins (CALCIUM-MAGNESIUM-ZINC-D3 PO) Take 1-2 tablets by mouth daily.    . simvastatin (ZOCOR) 40 MG tablet Take 1 tablet by mouth once daily 90 tablet 3   Current Facility-Administered Medications  Medication Dose Route Frequency Provider Last Rate Last Admin  . denosumab (PROLIA) injection 60 mg  60 mg Subcutaneous Q6 months Salley Scarlet, MD   60 mg at 04/17/20 1517    Allergies as  of 08/23/2020 - Review Complete 08/23/2020  Allergen Reaction Noted  . Sulfa antibiotics  11/09/2018    Family History  Problem Relation Age of Onset  . Colon cancer Neg Hx     Social History   Socioeconomic History  . Marital status: Widowed    Spouse name: Not on file  . Number of children: Not on file  . Years of education: Not on file  . Highest education level: Not on file  Occupational History  . Not on file  Tobacco Use  . Smoking status: Never Smoker  . Smokeless tobacco: Never Used  Vaping Use  . Vaping Use: Never used  Substance and Sexual Activity  . Alcohol use: Never  . Drug use: Never  . Sexual activity: Not  Currently  Other Topics Concern  . Not on file  Social History Narrative  . Not on file   Social Determinants of Health   Financial Resource Strain: Not on file  Food Insecurity: Not on file  Transportation Needs: Not on file  Physical Activity: Not on file  Stress: Not on file  Social Connections: Not on file    Review of Systems: Gen: Denies fever, chills, cold or flulike symptoms, lightheadedness, dizziness, presyncope, syncope. CV: Denies chest pain or palpitations. Resp: Denies dyspnea GI: See HPI  Heme: See HPI  Physical Exam: BP 131/82   Pulse 80   Temp (!) 96.9 F (36.1 C) (Temporal)   Ht 5\' 1"  (1.549 m)   Wt 104 lb 12.8 oz (47.5 kg)   BMI 19.80 kg/m  General:   Alert and oriented. No distress noted. Pleasant and cooperative. Thin.  Head:  Normocephalic and atraumatic. Eyes:  Conjuctiva clear without scleral icterus. Heart:  S1, S2 present. Systolic murmur appreciated.  Lungs:  Clear to auscultation bilaterally. No wheezes, rales, or rhonchi. No distress.  Abdomen:  +BS, soft, non-tender and non-distended. No rebound or guarding. No HSM or masses noted. Msk:  Symmetrical without gross deformities. Normal posture. Extremities:  With trace bilateral LE edema. Neurologic:  Alert and  oriented x4 Psych:  Normal mood and affect.   Assessment: 87 year old female with history of chronic diarrhea likely secondary to lactose intolerance and bile salt diarrhea s/p cholecystectomy.  TSH within normal limits and celiac screen negative.  Symptoms significantly improved/resolved with following a lactose-free diet and taking Tums before meals.  Occasional flare of diarrhea with eating out.  No alarm symptoms. No other significant GI symptoms.   Plan: 1.  Continue lactose-free diet. 2.  Continue Tums before meals. 3.  Follow-up as needed.     95, PA-C [facility] 08/23/2020

## 2020-08-23 ENCOUNTER — Ambulatory Visit (INDEPENDENT_AMBULATORY_CARE_PROVIDER_SITE_OTHER): Payer: Medicare Other | Admitting: Gastroenterology

## 2020-08-23 ENCOUNTER — Encounter: Payer: Self-pay | Admitting: Gastroenterology

## 2020-08-23 ENCOUNTER — Other Ambulatory Visit: Payer: Self-pay

## 2020-08-23 VITALS — BP 131/82 | HR 80 | Temp 96.9°F | Ht 61.0 in | Wt 104.8 lb

## 2020-08-23 DIAGNOSIS — K529 Noninfective gastroenteritis and colitis, unspecified: Secondary | ICD-10-CM | POA: Diagnosis not present

## 2020-08-23 NOTE — Progress Notes (Signed)
Cc'ed to pcp °

## 2020-08-23 NOTE — Patient Instructions (Signed)
Continue following a lactose-free diet and taking Tums prior to meals.  As you are doing very well without any significant diarrhea or any other GI symptoms, we will plan to follow-up with you as needed.  Do not hesitate to call if you have any new GI concerns!   It was great to see you today!   Ermalinda Memos, PA-C  [facility]

## 2020-09-25 ENCOUNTER — Encounter: Payer: Self-pay | Admitting: Family Medicine

## 2020-09-25 MED ORDER — LEVOTHYROXINE SODIUM 75 MCG PO TABS: 75.0000 ug | ORAL_TABLET | Freq: Every day | ORAL | 1 refills | Status: DC

## 2020-09-25 MED ORDER — AMLODIPINE BESYLATE 5 MG PO TABS: 5.0000 mg | ORAL_TABLET | Freq: Every day | ORAL | 3 refills | Status: DC

## 2020-10-13 ENCOUNTER — Encounter: Payer: Self-pay | Admitting: Family Medicine

## 2020-10-13 ENCOUNTER — Ambulatory Visit: Payer: Medicare Other | Admitting: Family Medicine

## 2020-10-13 ENCOUNTER — Ambulatory Visit (INDEPENDENT_AMBULATORY_CARE_PROVIDER_SITE_OTHER): Payer: Medicare Other | Admitting: Family Medicine

## 2020-10-13 ENCOUNTER — Other Ambulatory Visit: Payer: Self-pay

## 2020-10-13 VITALS — BP 128/64 | HR 82 | Temp 98.2°F | Resp 14 | Ht 61.0 in | Wt 106.0 lb

## 2020-10-13 DIAGNOSIS — F0150 Vascular dementia without behavioral disturbance: Secondary | ICD-10-CM | POA: Diagnosis not present

## 2020-10-13 DIAGNOSIS — I10 Essential (primary) hypertension: Secondary | ICD-10-CM | POA: Diagnosis not present

## 2020-10-13 DIAGNOSIS — E039 Hypothyroidism, unspecified: Secondary | ICD-10-CM

## 2020-10-13 LAB — CBC WITH DIFFERENTIAL/PLATELET
Absolute Monocytes: 728 cells/uL (ref 200–950)
Basophils Absolute: 33 cells/uL (ref 0–200)
Basophils Relative: 0.5 %
Eosinophils Absolute: 169 cells/uL (ref 15–500)
Eosinophils Relative: 2.6 %
HCT: 40.4 % (ref 35.0–45.0)
Hemoglobin: 12.8 g/dL (ref 11.7–15.5)
Lymphs Abs: 1677 cells/uL (ref 850–3900)
MCH: 27.3 pg (ref 27.0–33.0)
MCHC: 31.7 g/dL — ABNORMAL LOW (ref 32.0–36.0)
MCV: 86.1 fL (ref 80.0–100.0)
MPV: 10.8 fL (ref 7.5–12.5)
Monocytes Relative: 11.2 %
Neutro Abs: 3894 cells/uL (ref 1500–7800)
Neutrophils Relative %: 59.9 %
Platelets: 188 10*3/uL (ref 140–400)
RBC: 4.69 10*6/uL (ref 3.80–5.10)
RDW: 13.9 % (ref 11.0–15.0)
Total Lymphocyte: 25.8 %
WBC: 6.5 10*3/uL (ref 3.8–10.8)

## 2020-10-13 LAB — COMPLETE METABOLIC PANEL WITH GFR
AG Ratio: 1.2 (calc) (ref 1.0–2.5)
ALT: 14 U/L (ref 6–29)
AST: 25 U/L (ref 10–35)
Albumin: 4 g/dL (ref 3.6–5.1)
Alkaline phosphatase (APISO): 49 U/L (ref 37–153)
BUN/Creatinine Ratio: 17 (calc) (ref 6–22)
BUN: 22 mg/dL (ref 7–25)
CO2: 29 mmol/L (ref 20–32)
Calcium: 10 mg/dL (ref 8.6–10.4)
Chloride: 104 mmol/L (ref 98–110)
Creat: 1.3 mg/dL — ABNORMAL HIGH (ref 0.60–0.88)
GFR, Est African American: 42 mL/min/{1.73_m2} — ABNORMAL LOW (ref >=60)
GFR, Est Non African American: 36 mL/min/{1.73_m2} — ABNORMAL LOW (ref >=60)
Globulin: 3.3 g/dL (calc) (ref 1.9–3.7)
Glucose, Bld: 94 mg/dL (ref 65–99)
Potassium: 4.4 mmol/L (ref 3.5–5.3)
Sodium: 141 mmol/L (ref 135–146)
Total Bilirubin: 0.6 mg/dL (ref 0.2–1.2)
Total Protein: 7.3 g/dL (ref 6.1–8.1)

## 2020-10-13 LAB — LIPID PANEL
Cholesterol: 182 mg/dL (ref <200)
HDL: 53 mg/dL (ref >=50)
LDL Cholesterol (Calc): 108 mg/dL (calc) — ABNORMAL HIGH
Non-HDL Cholesterol (Calc): 129 mg/dL (calc) (ref <130)
Total CHOL/HDL Ratio: 3.4 (calc) (ref <5.0)
Triglycerides: 114 mg/dL (ref <150)

## 2020-10-13 LAB — TSH: TSH: 1.57 mIU/L (ref 0.40–4.50)

## 2020-10-13 NOTE — Progress Notes (Signed)
Subjective:    Patient ID: Dawn Conner, female    DOB: October 13, 1927, 87 y.o.   MRN: [id number]  HPI  Patient is a very sweet 87 year old Caucasian female who presents today for 41-month checkup.  Past medical history significant for hypothyroidism, hypertension, hyperlipidemia, and osteoporosis.  She is currently on Prolia for osteoporosis although we are still battling her insurance company for coverage.  She has a history of GI upset so we have try to avoid Fosamax for that reason.  She has hypertension but her blood pressure is well controlled today at 128/64.  She also has vascular dementia however her daughter states that this is relatively stable.  Her daughter denies any wandering behavior, violent behavior, sundowning, etc.  The patient is essentially noncommunicative and is unable to communicate with me although she does still follow commands.  She appears well-groomed and well cared for. Past Medical History:  Diagnosis Date   Dementia (HCC)    Diverticulosis    Hyperlipidemia    Hypertension    Osteoporosis    Oth fracture of shaft of right humerus, init for opn fx 1990's   Renal insufficiency    Thyroid disease    Past Surgical History:  Procedure Laterality Date   ABDOMINAL HYSTERECTOMY  1972   APPENDECTOMY     cataract surgery     Early 2000's both eyes    CHOLECYSTECTOMY  1980's   COLONOSCOPY  03/10/2006   Severe diverticulosis especially involving the sigmoid and to a lesser extent all other segments as well.  There was no evidence of neoplastic pathology. Recommended continuing Metamucil as well as Questran which appeared to keep her fairly regular. Consider repeat in 5 years.    OVARIAN CYST REMOVAL  09/2012   TONSILLECTOMY  age 83   Current Outpatient Medications on File Prior to Visit  Medication Sig Dispense Refill   amLODipine (NORVASC) 5 MG tablet Take 1 tablet (5 mg total) by mouth daily. 90 tablet 3   aspirin EC 81 MG tablet Take 81 mg by mouth daily.      calcium carbonate (TUMS - DOSED IN MG ELEMENTAL CALCIUM) 500 MG chewable tablet Chew 1 tablet by mouth. 1-2 meals per day     donepezil (ARICEPT) 5 MG tablet Take 1 tablet (5 mg total) by mouth at bedtime. 90 tablet 3   levothyroxine (EUTHYROX) 75 MCG tablet Take 1 tablet (75 mcg total) by mouth daily before breakfast. 90 tablet 1   loperamide (IMODIUM) 2 MG capsule Take by mouth as needed for diarrhea or loose stools.     Multiple Minerals-Vitamins (CALCIUM-MAGNESIUM-ZINC-D3 PO) Take 1-2 tablets by mouth daily.     simvastatin (ZOCOR) 40 MG tablet Take 1 tablet by mouth once daily 90 tablet 3   Current Facility-Administered Medications on File Prior to Visit  Medication Dose Route Frequency Provider Last Rate Last Admin   denosumab (PROLIA) injection 60 mg  60 mg Subcutaneous Q6 months Jeanice Lim, Kawanta F, MD   60 mg at 04/17/20 1517   Allergies  Allergen Reactions   Sulfa Antibiotics    Social History   Socioeconomic History   Marital status: Widowed    Spouse name: Not on file   Number of children: Not on file   Years of education: Not on file   Highest education level: Not on file  Occupational History   Not on file  Tobacco Use   Smoking status: Never   Smokeless tobacco: Never  Vaping Use   Vaping Use:  Never used  Substance and Sexual Activity   Alcohol use: Never   Drug use: Never   Sexual activity: Not Currently  Other Topics Concern   Not on file  Social History Narrative   Not on file   Social Determinants of Health   Financial Resource Strain: Not on file  Food Insecurity: Not on file  Transportation Needs: Not on file  Physical Activity: Not on file  Stress: Not on file  Social Connections: Not on file  Intimate Partner Violence: Not on file     Review of Systems  All other systems reviewed and are negative.     Objective:   Physical Exam Vitals reviewed.  Constitutional:      General: She is not in acute distress.    Appearance: Normal  appearance. She is normal weight. She is not toxic-appearing.  Neck:     Vascular: No carotid bruit.  Cardiovascular:     Rate and Rhythm: Normal rate and regular rhythm.     Heart sounds: Normal heart sounds. No murmur heard.   No friction rub. No gallop.  Pulmonary:     Effort: Pulmonary effort is normal. No respiratory distress.     Breath sounds: Normal breath sounds. No stridor. No wheezing or rales.  Abdominal:     General: Abdomen is flat. Bowel sounds are normal. There is no distension.     Palpations: Abdomen is soft.     Tenderness: There is no abdominal tenderness. There is no guarding or rebound.  Musculoskeletal:     Right lower leg: No edema.     Left lower leg: No edema.  Lymphadenopathy:     Cervical: No cervical adenopathy.  Neurological:     General: No focal deficit present.     Mental Status: She is alert.     Cranial Nerves: No cranial nerve deficit.     Motor: No weakness.  Psychiatric:        Speech: She is noncommunicative.         Assessment & Plan:   Primary hypertension - Plan: CBC with Differential/Platelet, COMPLETE METABOLIC PANEL WITH GFR, Lipid panel, TSH  Acquired hypothyroidism - Plan: CBC with Differential/Platelet, COMPLETE METABOLIC PANEL WITH GFR, Lipid panel, TSH  Vascular dementia without behavioral disturbance (HCC) - Plan: CBC with Differential/Platelet, COMPLETE METABOLIC PANEL WITH GFR, Lipid panel, TSH Blood pressure today is well controlled.  I will check a CBC, CMP, lipid panel, and TSH.  I will not make any changes in her medication.  We did discuss switching Fosamax to Prolia but the daughter would like [place] to try to get the Prolia approved due to the potential risk of GI upset on Fosamax and her mother seems to be tolerating the Prolia well.  I did recommend a booster for COVID vaccination.  Otherwise, she seems to be doing well with no concerns.  Daughter states that the dementia is relatively stable at the present time so she  prefers not to increase Aricept

## 2020-10-16 MED ORDER — VITAMIN D3 25 MCG (1000 UT) PO CAPS: ORAL_CAPSULE | ORAL | Status: DC

## 2020-10-17 ENCOUNTER — Encounter: Payer: Self-pay | Admitting: Family Medicine

## 2020-10-18 MED ORDER — DONEPEZIL HCL 5 MG PO TABS: 5.0000 mg | ORAL_TABLET | Freq: Every day | ORAL | 3 refills | Status: DC

## 2020-10-26 ENCOUNTER — Encounter: Payer: Self-pay | Admitting: Family Medicine

## 2020-10-26 MED ORDER — AMLODIPINE BESYLATE 5 MG PO TABS: 5.0000 mg | ORAL_TABLET | Freq: Every day | ORAL | 3 refills | Status: DC

## 2020-12-13 ENCOUNTER — Encounter: Payer: Self-pay | Admitting: Family Medicine

## 2020-12-14 MED ORDER — SIMVASTATIN 40 MG PO TABS: 40.0000 mg | ORAL_TABLET | Freq: Every day | ORAL | 3 refills | Status: DC

## 2020-12-26 ENCOUNTER — Encounter: Payer: Self-pay | Admitting: Family Medicine

## 2020-12-26 MED ORDER — AMLODIPINE BESYLATE 5 MG PO TABS
5.0000 mg | ORAL_TABLET | Freq: Every day | ORAL | 3 refills | Status: DC
Start: 2020-12-26 — End: 2021-05-23

## 2020-12-26 MED ORDER — LEVOTHYROXINE SODIUM 75 MCG PO TABS: 75.0000 ug | ORAL_TABLET | Freq: Every day | ORAL | 1 refills | Status: DC

## 2021-01-17 ENCOUNTER — Encounter: Payer: Self-pay | Admitting: Family Medicine

## 2021-01-17 ENCOUNTER — Telehealth: Payer: Self-pay

## 2021-01-17 NOTE — Telephone Encounter (Signed)
Pt's daughter phoned and advised that for the past 2 weeks the pt has had more urgently diarrhea that is associated with left side pain during the episodes. The pt's daughter said her mother points to the higher area on left side and underneath her breast. There is also gas with this. No nausea or vomiting. The pt's daughter states that she wants to keep her February appt with you but she would also like to get her mother in before then as well. States she thinks her mother needs to be evaluated. I asked about the severity of her pain and she states her mother is 39 so she just experiences pain during the episodes. (At times she doesn't know the pt has had a bowel movement until she comes up to her). Please advise. (The pt's daughter cell number [phone]).

## 2021-01-17 NOTE — Telephone Encounter (Signed)
Spoke with patient's daughter.  Reports about 1 week ago, patient developed fairly a significant episode of diarrhea along with soft/mushy stools after eating out.  This resolved with Imodium.  2 weeks later, she had a similar, but less severe episode after eating a hamburger.  Yesterday, she also had a minor episode with no identified triggers.  She has still been following a lactose-free diet and tries to stick to a low-fat diet.  Reports her mom was rubbing her left side during these episodes of diarrhea, but left-sided pain resolved after bowel movements.  She has not had any recurrent pain.  Patient's daughter thinks her mom is skipping days between bowel movements and may not be having good bowel movements.    Query whether symptoms are secondary to underlying constipation with overflow diarrhea.   I have recommended adding Benefiber daily and increasing water intake. We will plan to see her in the office at 8am on Monday for further evaluation. She will call if any worsening symptoms before then.   Stacey: Please schedule patient to see me on 10/3 at 8am.

## 2021-01-17 NOTE — Telephone Encounter (Signed)
Noted  

## 2021-01-20 NOTE — Progress Notes (Signed)
Referring Provider: Donita Brooks, MD Primary Care Physician:  Donita Brooks, MD Primary GI Physician: Dr. Marletta Lor  Chief Complaint  Patient presents with   Constipation   Diarrhea    HPI:   Dawn Conner is a 87 y.o. female with history of dementia, hypothyroidism, HTN, HLD, osteoporosis, and chronic history of intermittent diarrhea likely secondary to lactose intolerance and bile salt diarrhea s/p cholecystectomy.  Previously, TSH wnl on medications and celiac screen negative.  Symptoms had improved with lactose-free diet and Tums before meals and planned to follow-up as needed after her last office visit on 08/23/2020.  Patient's daughter called 01/17/2021 reporting a couple of episodes of diarrhea that seemed different than her prior symptoms as she had some associated left-sided abdominal pain that resolved after bowel movements.  First episode occurred after eating out with several episodes of diarrhea the same day, but resolved with Imodium.  2 weeks later, similar, but less severe episode after eating a hamburger.  Then on 9/27, minor episode with no identified triggers.  Has still been following a lactose-free diet and tries to stick to a low-fat diet.  Daughter stated patient was skipping days between bowel movements and may not be having good bowel movements.  Queried whether symptoms may be secondary to underlying constipation with overflow diarrhea.  I recommended starting Benefiber daily and increasing water intake.  Today:  Hasn't had a recurrent episode of urgent mushy stool since last week. Mild upper abdominal discomfort prior to Bms when having looser stools that resolves with a BM. Patient states it isn't much pain. No post prandial abdominal pain, nausea, or vomiting.  No NSAIDs. Only tylenol.  No heartburn symptoms.  No completely watery stools. Currently stools are soft or mushy, typically 1/day.  Feels she has good productive bowel movements and empties well.  May  skip 1 or 2 days/week without a bowel movement.  Has not started Benefiber yet.  No blood in the stools or black stools.  Still following a lactose free diet and tums before meals which she feels really helps. Noticed she didn't take tums prior to a meal with the first episode of diarrhea that was the most severe.  This is when they went out to eat and she had Spaghetti and meatballs, salad, and breadstick.   Weight is stable.   Past Medical History:  Diagnosis Date   Dementia (HCC)    Diverticulosis    Hyperlipidemia    Hypertension    Osteoporosis    Oth fracture of shaft of right humerus, init for opn fx 1990's   Renal insufficiency    Thyroid disease     Past Surgical History:  Procedure Laterality Date   ABDOMINAL HYSTERECTOMY  1972   APPENDECTOMY     cataract surgery     Early 2000's both eyes    CHOLECYSTECTOMY  1980's   COLONOSCOPY  03/10/2006   Severe diverticulosis especially involving the sigmoid and to a lesser extent all other segments as well.  There was no evidence of neoplastic pathology. Recommended continuing Metamucil as well as Questran which appeared to keep her fairly regular. Consider repeat in 5 years.    OVARIAN CYST REMOVAL  09/2012   TONSILLECTOMY  age 25    Current Outpatient Medications  Medication Sig Dispense Refill   amLODipine (NORVASC) 5 MG tablet Take 1 tablet (5 mg total) by mouth daily. 90 tablet 3   aspirin EC 81 MG tablet Take 81 mg by mouth daily.  calcium carbonate (TUMS - DOSED IN MG ELEMENTAL CALCIUM) 500 MG chewable tablet Chew 1 tablet by mouth. 1-2 meals per day     Cholecalciferol (VITAMIN D3) 25 MCG (1000 UT) CAPS 1 Q AM, 2 Q PM     levothyroxine (EUTHYROX) 75 MCG tablet Take 1 tablet (75 mcg total) by mouth daily before breakfast. 90 tablet 1   loperamide (IMODIUM) 2 MG capsule Take by mouth as needed for diarrhea or loose stools.     Multiple Minerals-Vitamins (CALCIUM-MAGNESIUM-ZINC-D3 PO) Take 1-2 tablets by mouth daily.      simvastatin (ZOCOR) 40 MG tablet Take 1 tablet (40 mg total) by mouth daily. 90 tablet 3   donepezil (ARICEPT) 5 MG tablet Take 1 tablet (5 mg total) by mouth at bedtime. 90 tablet 3   Current Facility-Administered Medications  Medication Dose Route Frequency Provider Last Rate Last Admin   denosumab (PROLIA) injection 60 mg  60 mg Subcutaneous Q6 months Milinda Antis F, MD   60 mg at 04/17/20 1517    Allergies as of 01/22/2021 - Review Complete 01/22/2021  Allergen Reaction Noted   Sulfa antibiotics  11/09/2018    Family History  Problem Relation Age of Onset   Colon cancer Neg Hx     Social History   Socioeconomic History   Marital status: Widowed    Spouse name: Not on file   Number of children: Not on file   Years of education: Not on file   Highest education level: Not on file  Occupational History   Not on file  Tobacco Use   Smoking status: Never   Smokeless tobacco: Never  Vaping Use   Vaping Use: Never used  Substance and Sexual Activity   Alcohol use: Never   Drug use: Never   Sexual activity: Not Currently  Other Topics Concern   Not on file  Social History Narrative   Not on file   Social Determinants of Health   Financial Resource Strain: Not on file  Food Insecurity: Not on file  Transportation Needs: Not on file  Physical Activity: Not on file  Stress: Not on file  Social Connections: Not on file    Review of Systems: Gen: Denies fever, chills, cold or flulike symptoms, presyncope, syncope. CV: Denies chest pain, palpitations. Resp: Denies dyspnea or cough. GI: See HPI Heme: See HPI  Physical Exam: BP 140/81   Pulse 83   Temp (!) 96.9 F (36.1 C) (Temporal)   Ht 5' (1.524 m)   Wt 104 lb 3.2 oz (47.3 kg)   BMI 20.35 kg/m  General:   Alert and oriented. No distress noted. Pleasant and cooperative.  Head:  Normocephalic and atraumatic. Eyes:  Conjuctiva clear without scleral icterus. Heart:  S1, S2 present.  Systolic murmur  appreciated. Lungs:  Clear to auscultation bilaterally. No wheezes, rales, or rhonchi. No distress.  Abdomen:  +BS, soft, and non-distended.  Minimal TTP in RUQ and epigastric region. No rebound or guarding. No HSM or masses noted. Msk:  Symmetrical without gross deformities. Normal posture. Extremities:  Without edema. Neurologic:  Alert and  oriented x2. Psych:  Normal mood and affect.    Assessment:  87 year old female with history of dementia, hypothyroidism, HTN, HLD, osteoporosis, presenting today for follow-up of chronic history of intermittent diarrhea likely secondary to lactose intolerance and bile salt diarrhea s/p cholecystectomy.  Previously, celiac screen negative.  TSH in June 2022 within normal limits. Symptoms had been well managed with lactose-free diet and Tums before  meals, but patient has had some breakthrough symptoms over the last month with associated upper abdominal discomfort that improves with a bowel movement. Notably, on 2 of these occurrence, patient had consumed meals with fattier content and forgot to take tums prior to going out to eat. No alarm symptoms. She does occasionally skip days between bowel movements which could contribute to increased stool passage when bowels start to move, but it doesn't sound like she is having any significant constipation at this point.  I will have her add Benefiber daily to help with bowel regularity.  As she doesn't have any significant watery diarrhea and bowels are really essentially at baseline, I suspect breakthrough symptoms were secondary to dietary intolerances.  Could have a component of IBS as she notes mild upper abdominal discomfort in the setting of breakthrough diarrhea that resolved with a bowel movement, but as symptoms are minimal, there is no need for additional medications at this time.  Patient and her daughter will continue to monitor her symptoms and let us know of any worsening of abdominal pain or diarrhea.   Otherwise, she will continue her current regimen of Tums before meals, lactose-free diet, Imodium as needed.  Plan:  Start Benefiber 2 teaspoons daily x2 weeks then increase to twice daily. Continue following a low-fat diet and taking Tums prior to meals. Continue lactose-free diet or take Lactaid tablets prior to dairy consumption. Monitor for any worsening abdominal pain or diarrhea and let us know if this occurs. Follow-up as needed.   Ermalinda Memos, PA-C [facility] 01/22/2021

## 2021-01-22 ENCOUNTER — Encounter: Payer: Self-pay | Admitting: Gastroenterology

## 2021-01-22 ENCOUNTER — Other Ambulatory Visit: Payer: Self-pay

## 2021-01-22 ENCOUNTER — Ambulatory Visit (INDEPENDENT_AMBULATORY_CARE_PROVIDER_SITE_OTHER): Payer: Medicare Other | Admitting: Gastroenterology

## 2021-01-22 VITALS — BP 140/81 | HR 83 | Temp 96.9°F | Ht 60.0 in | Wt 104.2 lb

## 2021-01-22 DIAGNOSIS — R198 Other specified symptoms and signs involving the digestive system and abdomen: Secondary | ICD-10-CM | POA: Diagnosis not present

## 2021-01-22 MED ORDER — DONEPEZIL HCL 5 MG PO TABS
5.0000 mg | ORAL_TABLET | Freq: Every day | ORAL | 3 refills | Status: DC
Start: 2021-01-22 — End: 2022-02-04

## 2021-01-22 NOTE — Patient Instructions (Signed)
I suspect intermittent diarrhea is likely secondary to lactose intolerance and history of gallbladder surgery.  You may also have some mild constipation as you skip days between bowel movements.   Continue following a lactose-free diet or take Lactaid tablets prior to dairy consumption.  Continue Tums before meals.  Start Benefiber 2 teaspoons daily x2 weeks then increase to twice daily to help with bowel regularity.   We will follow up with you as needed.  Do not hesitate to call with questions or concerns.  Ermalinda Memos, PA-C [facility]

## 2021-03-12 ENCOUNTER — Encounter: Payer: Self-pay | Admitting: Family Medicine

## 2021-03-26 ENCOUNTER — Telehealth: Payer: Self-pay | Admitting: Gastroenterology

## 2021-03-26 NOTE — Telephone Encounter (Signed)
Is the pain in her right or left side?  With pain being worse with bending over, it sounds like it could be musculoskeletal.  Does she have any pain after eating or if she having pain related to bowel movements?  Regarding report of small bowel movements, if she having small-volume/incomplete bowel movements/constipation?

## 2021-03-26 NOTE — Telephone Encounter (Signed)
Spoke to pt's [name]). She states pt is still have pain in her side. It's worse when she bends over. No fever, nausea, or vomiting. Has been having small bowel movements. Pt's daughter would like to know if she needs some test or labs done before up coming OV.

## 2021-03-26 NOTE — Telephone Encounter (Signed)
PATIENT DAUGHTER CALLED STATING THAT HER MOTHERS PAIN IN HER SIDE IS STILL ONGOING,  HAD ISSUES THIS WEEKEND.  IS THERE ANYTHING SHE NEEDS TO HAVE DONE BEFORE HER UPCOMING APPOINTMENT.  TESTS, LABS ETC.

## 2021-03-27 NOTE — Telephone Encounter (Signed)
Spoke to pt's [name] [facility]). She states her mother is feeling a little better after having another bowel movement. She states pt is eating fine and no pain after eating. No pain when having a bowel movement. Daughter states she would watch pt for a couple of days and see if she gets any worse. Pt has upcoming OV with PCP daughter will discuss more with him.

## 2021-03-28 ENCOUNTER — Other Ambulatory Visit: Payer: Self-pay | Admitting: Gastroenterology

## 2021-03-28 DIAGNOSIS — R109 Unspecified abdominal pain: Secondary | ICD-10-CM

## 2021-03-28 NOTE — Telephone Encounter (Signed)
Spoke to pt's [name] [facility]) she stated that her mother is having small bowel movements. No constipation.

## 2021-03-28 NOTE — Telephone Encounter (Signed)
As she notices some improvement with a BM, I wonder if she is not completely emptying when she has a BM. We can arrange for her to have an abdominal x-ray to evaluate for a large stool burden.

## 2021-03-28 NOTE — Telephone Encounter (Signed)
Noted. Orders have been placed for abdominal x-ray at [facility]. She can go at her convenience in the next couple of days. She doesn't have to have an appointment. She can walk in to have this done.

## 2021-03-28 NOTE — Telephone Encounter (Signed)
Noted. In regards to reports of small bowel movements, please find out if she is having small-volume/incomplete bowel movements/constipation? If so, this could contribute to abdominal pain.

## 2021-03-28 NOTE — Telephone Encounter (Signed)
Spoke to pt's [name] [facility]) informed her of recommendations. She agreed to X-ray. Also stated that stool has changed colors, bowel movements were a yellowish color and the last couple have been a darker color. Not black or any blood

## 2021-03-29 NOTE — Telephone Encounter (Signed)
Spoke to pt's [name] [facility]) informed her that the orders have been placed for abdominal X-ray and she could take her mother to [facility]. She voiced understanding.

## 2021-03-29 NOTE — Telephone Encounter (Signed)
Noted  

## 2021-03-30 ENCOUNTER — Other Ambulatory Visit: Payer: Self-pay

## 2021-03-30 ENCOUNTER — Ambulatory Visit (INDEPENDENT_AMBULATORY_CARE_PROVIDER_SITE_OTHER): Payer: Medicare Other

## 2021-03-30 ENCOUNTER — Telehealth: Payer: Self-pay

## 2021-03-30 ENCOUNTER — Ambulatory Visit (HOSPITAL_COMMUNITY)
Admission: RE | Admit: 2021-03-30 | Discharge: 2021-03-30 | Disposition: A | Discharge status: Home / Self Care | Payer: Medicare Other | Source: Ambulatory Visit | Attending: Gastroenterology | Admitting: Gastroenterology

## 2021-03-30 DIAGNOSIS — Z23 Encounter for immunization: Secondary | ICD-10-CM | POA: Diagnosis not present

## 2021-03-30 DIAGNOSIS — R109 Unspecified abdominal pain: Secondary | ICD-10-CM | POA: Diagnosis present

## 2021-03-30 NOTE — Telephone Encounter (Signed)
Pt's daughter came into office asking to get forms filled out to get assistance with pt getting home health. Pt's daughter filled out an intake form giving brief explanation of what is needed to be filled out on forms. Intake form and forms placed in nurse's folder. Please call pt's daughter when forms are available to be picked up.  Cb#: [phone]

## 2021-04-02 NOTE — Telephone Encounter (Signed)
Spoke with daughter and scheduled pt for OV 04/09/21. We will complete forms at that time.

## 2021-04-02 NOTE — Telephone Encounter (Signed)
Home Health requires a face-to-face visit in order to initiate HH orders.   [facility] to schedule pt for OV

## 2021-04-09 ENCOUNTER — Ambulatory Visit (INDEPENDENT_AMBULATORY_CARE_PROVIDER_SITE_OTHER): Payer: Medicare Other | Admitting: Family Medicine

## 2021-04-09 ENCOUNTER — Encounter: Payer: Self-pay | Admitting: Family Medicine

## 2021-04-09 ENCOUNTER — Other Ambulatory Visit: Payer: Self-pay

## 2021-04-09 VITALS — BP 122/78 | HR 76 | Resp 18 | Ht 60.0 in | Wt 101.0 lb

## 2021-04-09 DIAGNOSIS — I10 Essential (primary) hypertension: Secondary | ICD-10-CM

## 2021-04-09 DIAGNOSIS — E782 Mixed hyperlipidemia: Secondary | ICD-10-CM

## 2021-04-09 DIAGNOSIS — E039 Hypothyroidism, unspecified: Secondary | ICD-10-CM | POA: Diagnosis not present

## 2021-04-09 DIAGNOSIS — F01B0 Vascular dementia, moderate, without behavioral disturbance, psychotic disturbance, mood disturbance, and anxiety: Secondary | ICD-10-CM

## 2021-04-09 NOTE — Progress Notes (Signed)
Subjective:    Patient ID: Dawn Conner, female    DOB: 29-Apr-1927, 87 y.o.   MRN: [id number]  HPI  Patient is here today for follow-up.  She is accompanied by her daughter.  She lives at home with her daughter and her son-in-law.  She has moderate vascular dementia.  She is able to answer questions today however her daughter quite frequently has to interject to correct misstatements.  For the most part she is quiet.  She denies any chest pain.  She denies any shortness of breath.  She denies any syncope or near syncope.  She does have a 2/6 systolic ejection murmur heard best over the aortic valve but this is asymptomatic.  She denies any recent falls although she is becoming progressively more deconditioned and weak.  The daughter does not feel that she needs physical therapy at the present time however this could be an issue in the future.  She has lost 5 pounds since her last visit.  She is not drinking as much water.  She denies dizziness.  She recently saw gastroenterology due to constipation and abdominal pain which is likely related to her dehydration as well as decreased fiber intake.  Daughter denies any hallucinations or delusions or wandering behavior.  Daughter is having to give her a bath at home.  At the present time the patient is able to help enough to accomplish this task but they may need help with ADLs in the future.  Past Medical History:  Diagnosis Date   Dementia (HCC)    Diverticulosis    Hyperlipidemia    Hypertension    Osteoporosis    Oth fracture of shaft of right humerus, init for opn fx 1990's   Renal insufficiency    Thyroid disease    Past Surgical History:  Procedure Laterality Date   ABDOMINAL HYSTERECTOMY  1972   APPENDECTOMY     cataract surgery     Early 2000's both eyes    CHOLECYSTECTOMY  1980's   COLONOSCOPY  03/10/2006   Severe diverticulosis especially involving the sigmoid and to a lesser extent all other segments as well.  There was no evidence  of neoplastic pathology. Recommended continuing Metamucil as well as Questran which appeared to keep her fairly regular. Consider repeat in 5 years.    OVARIAN CYST REMOVAL  09/2012   TONSILLECTOMY  age 68   Current Outpatient Medications on File Prior to Visit  Medication Sig Dispense Refill   amLODipine (NORVASC) 5 MG tablet Take 1 tablet (5 mg total) by mouth daily. 90 tablet 3   aspirin EC 81 MG tablet Take 81 mg by mouth daily.     calcium carbonate (TUMS - DOSED IN MG ELEMENTAL CALCIUM) 500 MG chewable tablet Chew 1 tablet by mouth. 1-2 meals per day     Cholecalciferol (VITAMIN D3) 25 MCG (1000 UT) CAPS 1 Q AM, 2 Q PM     donepezil (ARICEPT) 5 MG tablet Take 1 tablet (5 mg total) by mouth at bedtime. 90 tablet 3   levothyroxine (EUTHYROX) 75 MCG tablet Take 1 tablet (75 mcg total) by mouth daily before breakfast. 90 tablet 1   loperamide (IMODIUM) 2 MG capsule Take by mouth as needed for diarrhea or loose stools.     Multiple Minerals-Vitamins (CALCIUM-MAGNESIUM-ZINC-D3 PO) Take 1-2 tablets by mouth daily.     simvastatin (ZOCOR) 40 MG tablet Take 1 tablet (40 mg total) by mouth daily. 90 tablet 3   Current Facility-Administered Medications on  File Prior to Visit  Medication Dose Route Frequency Provider Last Rate Last Admin   denosumab (PROLIA) injection 60 mg  60 mg Subcutaneous Q6 months Salley Scarlet, MD   60 mg at 04/17/20 1517   Allergies  Allergen Reactions   Sulfa Antibiotics    Social History   Socioeconomic History   Marital status: Widowed    Spouse name: Not on file   Number of children: Not on file   Years of education: Not on file   Highest education level: Not on file  Occupational History   Not on file  Tobacco Use   Smoking status: Never   Smokeless tobacco: Never  Vaping Use   Vaping Use: Never used  Substance and Sexual Activity   Alcohol use: Never   Drug use: Never   Sexual activity: Not Currently  Other Topics Concern   Not on file  Social  History Narrative   Not on file   Social Determinants of Health   Financial Resource Strain: Not on file  Food Insecurity: Not on file  Transportation Needs: Not on file  Physical Activity: Not on file  Stress: Not on file  Social Connections: Not on file  Intimate Partner Violence: Not on file     Review of Systems  All other systems reviewed and are negative.     Objective:   Physical Exam Vitals reviewed.  Constitutional:      General: She is not in acute distress.    Appearance: Normal appearance. She is normal weight. She is not toxic-appearing.  Neck:     Vascular: No carotid bruit.  Cardiovascular:     Rate and Rhythm: Normal rate and regular rhythm.     Heart sounds: Murmur heard.    No friction rub. No gallop.  Pulmonary:     Effort: Pulmonary effort is normal. No respiratory distress.     Breath sounds: Normal breath sounds. No stridor. No wheezing or rales.  Abdominal:     General: Abdomen is flat. Bowel sounds are normal. There is no distension.     Palpations: Abdomen is soft.     Tenderness: There is no abdominal tenderness. There is no guarding or rebound.  Musculoskeletal:     Right lower leg: No edema.     Left lower leg: No edema.  Lymphadenopathy:     Cervical: No cervical adenopathy.  Neurological:     General: No focal deficit present.     Mental Status: She is alert.     Cranial Nerves: No cranial nerve deficit.     Motor: No weakness.  Psychiatric:        Mood and Affect: Affect is flat.        Cognition and Memory: Cognition is impaired. Memory is impaired. She exhibits impaired recent memory.         Assessment & Plan:   Primary hypertension - Plan: CBC with Differential/Platelet, COMPLETE METABOLIC PANEL WITH GFR, TSH, Lipid panel  Acquired hypothyroidism - Plan: CBC with Differential/Platelet, COMPLETE METABOLIC PANEL WITH GFR, TSH, Lipid panel  Mixed hyperlipidemia - Plan: CBC with Differential/Platelet, COMPLETE METABOLIC  PANEL WITH GFR, TSH, Lipid panel  Moderate vascular dementia without behavioral disturbance, psychotic disturbance, mood disturbance, or anxiety Due today to decrease fluid intake, weight loss, and low blood pressure, I have recommended stopping amlodipine.  Due to her advanced age, and her previously well-controlled cholesterol and decreasing oral intake, I recommended reducing the dose of simvastatin to 20 mg a day.  We can recheck her lab work in 6 months but I want to avoid any medication side effects of possible.  We also discussed a referral to home health for possible physical therapy and assistance with ADLs however the patient's daughter does not feel that that is required yet.  She will email me in the future if we need to take the next step as her condition deteriorates.  I believe the patient is slightly dehydrated today.  I encouraged her to try to drink more fluid.

## 2021-04-10 ENCOUNTER — Telehealth: Payer: Self-pay

## 2021-04-10 LAB — COMPLETE METABOLIC PANEL WITH GFR
AG Ratio: 1.1 (calc) (ref 1.0–2.5)
ALT: 14 U/L (ref 6–29)
AST: 24 U/L (ref 10–35)
Albumin: 3.9 g/dL (ref 3.6–5.1)
Alkaline phosphatase (APISO): 58 U/L (ref 37–153)
BUN/Creatinine Ratio: 20 (calc) (ref 6–22)
BUN: 26 mg/dL — ABNORMAL HIGH (ref 7–25)
CO2: 25 mmol/L (ref 20–32)
Calcium: 10.3 mg/dL (ref 8.6–10.4)
Chloride: 104 mmol/L (ref 98–110)
Creat: 1.32 mg/dL — ABNORMAL HIGH (ref 0.60–0.95)
Globulin: 3.4 g/dL (calc) (ref 1.9–3.7)
Glucose, Bld: 93 mg/dL (ref 65–99)
Potassium: 4.1 mmol/L (ref 3.5–5.3)
Sodium: 140 mmol/L (ref 135–146)
Total Bilirubin: 0.5 mg/dL (ref 0.2–1.2)
Total Protein: 7.3 g/dL (ref 6.1–8.1)
eGFR: 38 mL/min/{1.73_m2} — ABNORMAL LOW (ref >=60)

## 2021-04-10 LAB — CBC WITH DIFFERENTIAL/PLATELET
Absolute Monocytes: 782 cells/uL (ref 200–950)
Basophils Absolute: 27 cells/uL (ref 0–200)
Basophils Relative: 0.4 %
Eosinophils Absolute: 88 cells/uL (ref 15–500)
Eosinophils Relative: 1.3 %
HCT: 39.2 % (ref 35.0–45.0)
Hemoglobin: 12.7 g/dL (ref 11.7–15.5)
Lymphs Abs: 1673 cells/uL (ref 850–3900)
MCH: 27.9 pg (ref 27.0–33.0)
MCHC: 32.4 g/dL (ref 32.0–36.0)
MCV: 86.2 fL (ref 80.0–100.0)
MPV: 11.7 fL (ref 7.5–12.5)
Monocytes Relative: 11.5 %
Neutro Abs: 4230 cells/uL (ref 1500–7800)
Neutrophils Relative %: 62.2 %
Platelets: 195 10*3/uL (ref 140–400)
RBC: 4.55 10*6/uL (ref 3.80–5.10)
RDW: 13.2 % (ref 11.0–15.0)
Total Lymphocyte: 24.6 %
WBC: 6.8 10*3/uL (ref 3.8–10.8)

## 2021-04-10 LAB — LIPID PANEL
Cholesterol: 168 mg/dL (ref <200)
HDL: 56 mg/dL (ref >=50)
LDL Cholesterol (Calc): 92 mg/dL (calc)
Non-HDL Cholesterol (Calc): 112 mg/dL (calc) (ref <130)
Total CHOL/HDL Ratio: 3 (calc) (ref <5.0)
Triglycerides: 108 mg/dL (ref <150)

## 2021-04-10 LAB — TSH: TSH: 1.01 mIU/L (ref 0.40–4.50)

## 2021-04-10 NOTE — Telephone Encounter (Signed)
Left message for patient to review results and contact the office if any questions.

## 2021-04-10 NOTE — Telephone Encounter (Signed)
-----   Message from Donita Brooks, MD sent at 04/10/2021  6:51 AM EST ----- Aside from mild kidney damage related to dehydration and age, labs are outstanding.  Cholesterol is so low I definitely think she can reduce her simvastatin to 20 mg.  We will recheck it in 6 months and likely discontinue the medication if it stays this low.

## 2021-04-20 ENCOUNTER — Ambulatory Visit (INDEPENDENT_AMBULATORY_CARE_PROVIDER_SITE_OTHER): Payer: Medicare Other

## 2021-04-20 ENCOUNTER — Telehealth: Payer: Self-pay

## 2021-04-20 ENCOUNTER — Other Ambulatory Visit: Payer: Self-pay

## 2021-04-20 ENCOUNTER — Ambulatory Visit: Payer: Medicare Other | Admitting: Family Medicine

## 2021-04-20 VITALS — Ht 60.0 in | Wt 101.0 lb

## 2021-04-20 DIAGNOSIS — Z0000 Encounter for general adult medical examination without abnormal findings: Secondary | ICD-10-CM

## 2021-04-20 NOTE — Patient Instructions (Signed)
Dawn Conner , Thank you for taking time to come for your Medicare Wellness Visit. I appreciate your ongoing commitment to your health goals. Please review the following plan we discussed and let me know if I can assist you in the future.   Screening recommendations/referrals: Colonoscopy: No longer required due to age. Mammogram: No longer required due to age. Bone Density: No longer required due to age.  Recommended yearly ophthalmology/optometry visit for glaucoma screening and checkup Recommended yearly dental visit for hygiene and checkup  Vaccinations: Influenza vaccine: Done 03/30/2021 Repeat annually  Pneumococcal vaccine: Done 12/23/2016 and 04/15/2020. Tdap vaccine: Due. Repeat in 10 years  Shingles vaccine: Shingrix discussed. Please contact your pharmacy for coverage information.    Covid-19:Declined.  Advanced directives: Copy of forms in pt's chart.  Conditions/risks identified: Keep up the good work!  Next appointment: Follow up in one year for your annual wellness visit 04/26/2022 @ 12:00 pm.   Preventive Care 85 Years and Older, Female Preventive care refers to lifestyle choices and visits with your health care provider that can promote health and wellness. What does preventive care include? A yearly physical exam. This is also called an annual well check. Dental exams once or twice a year. Routine eye exams. Ask your health care provider how often you should have your eyes checked. Personal lifestyle choices, including: Daily care of your teeth and gums. Regular physical activity. Eating a healthy diet. Avoiding tobacco and drug use. Limiting alcohol use. Practicing safe sex. Taking low-dose aspirin every day. Taking vitamin and mineral supplements as recommended by your health care provider. What happens during an annual well check? The services and screenings done by your health care provider during your annual well check will depend on your age, overall  health, lifestyle risk factors, and family history of disease. Counseling  Your health care provider may ask you questions about your: Alcohol use. Tobacco use. Drug use. Emotional well-being. Home and relationship well-being. Sexual activity. Eating habits. History of falls. Memory and ability to understand (cognition). Work and work Astronomer. Reproductive health. Screening  You may have the following tests or measurements: Height, weight, and BMI. Blood pressure. Lipid and cholesterol levels. These may be checked every 5 years, or more frequently if you are over 45 years old. Skin check. Lung cancer screening. You may have this screening every year starting at age 15 if you have a 30-pack-year history of smoking and currently smoke or have quit within the past 15 years. Fecal occult blood test (FOBT) of the stool. You may have this test every year starting at age 41. Flexible sigmoidoscopy or colonoscopy. You may have a sigmoidoscopy every 5 years or a colonoscopy every 10 years starting at age 48. Hepatitis C blood test. Hepatitis B blood test. Sexually transmitted disease (STD) testing. Diabetes screening. This is done by checking your blood sugar (glucose) after you have not eaten for a while (fasting). You may have this done every 1-3 years. Bone density scan. This is done to screen for osteoporosis. You may have this done starting at age 47. Mammogram. This may be done every 1-2 years. Talk to your health care provider about how often you should have regular mammograms. Talk with your health care provider about your test results, treatment options, and if necessary, the need for more tests. Vaccines  Your health care provider may recommend certain vaccines, such as: Influenza vaccine. This is recommended every year. Tetanus, diphtheria, and acellular pertussis (Tdap, Td) vaccine. You may need a  Td booster every 10 years. Zoster vaccine. You may need this after age  85. Pneumococcal 13-valent conjugate (PCV13) vaccine. One dose is recommended after age 85. Pneumococcal polysaccharide (PPSV23) vaccine. One dose is recommended after age 65. Talk to your health care provider about which screenings and vaccines you need and how often you need them. This information is not intended to replace advice given to you by your health care provider. Make sure you discuss any questions you have with your health care provider. Document Released: 05/05/2015 Document Revised: 12/27/2015 Document Reviewed: 02/07/2015 Elsevier Interactive Patient Education  2017 [employer] Prevention in the Home Falls can cause injuries. They can happen to people of all ages. There are many things you can do to make your home safe and to help prevent falls. What can I do on the outside of my home? Regularly fix the edges of walkways and driveways and fix any cracks. Remove anything that might make you trip as you walk through a door, such as a raised step or threshold. Trim any bushes or trees on the path to your home. Use bright outdoor lighting. Clear any walking paths of anything that might make someone trip, such as rocks or tools. Regularly check to see if handrails are loose or broken. Make sure that both sides of any steps have handrails. Any raised decks and porches should have guardrails on the edges. Have any leaves, snow, or ice cleared regularly. Use sand or salt on walking paths during winter. Clean up any spills in your garage right away. This includes oil or grease spills. What can I do in the bathroom? Use night lights. Install grab bars by the toilet and in the tub and shower. Do not use towel bars as grab bars. Use non-skid mats or decals in the tub or shower. If you need to sit down in the shower, use a plastic, non-slip stool. Keep the floor dry. Clean up any water that spills on the floor as soon as it happens. Remove soap buildup in the tub or shower  regularly. Attach bath mats securely with double-sided non-slip rug tape. Do not have throw rugs and other things on the floor that can make you trip. What can I do in the bedroom? Use night lights. Make sure that you have a light by your bed that is easy to reach. Do not use any sheets or blankets that are too big for your bed. They should not hang down onto the floor. Have a firm chair that has side arms. You can use this for support while you get dressed. Do not have throw rugs and other things on the floor that can make you trip. What can I do in the kitchen? Clean up any spills right away. Avoid walking on wet floors. Keep items that you use a lot in easy-to-reach places. If you need to reach something above you, use a strong step stool that has a grab bar. Keep electrical cords out of the way. Do not use floor polish or wax that makes floors slippery. If you must use wax, use non-skid floor wax. Do not have throw rugs and other things on the floor that can make you trip. What can I do with my stairs? Do not leave any items on the stairs. Make sure that there are handrails on both sides of the stairs and use them. Fix handrails that are broken or loose. Make sure that handrails are as long as the stairways. Check any  carpeting to make sure that it is firmly attached to the stairs. Fix any carpet that is loose or worn. Avoid having throw rugs at the top or bottom of the stairs. If you do have throw rugs, attach them to the floor with carpet tape. Make sure that you have a light switch at the top of the stairs and the bottom of the stairs. If you do not have them, ask someone to add them for you. What else can I do to help prevent falls? Wear shoes that: Do not have high heels. Have rubber bottoms. Are comfortable and fit you well. Are closed at the toe. Do not wear sandals. If you use a stepladder: Make sure that it is fully opened. Do not climb a closed stepladder. Make sure that  both sides of the stepladder are locked into place. Ask someone to hold it for you, if possible. Clearly mark and make sure that you can see: Any grab bars or handrails. First and last steps. Where the edge of each step is. Use tools that help you move around (mobility aids) if they are needed. These include: Canes. Walkers. Scooters. Crutches. Turn on the lights when you go into a dark area. Replace any light bulbs as soon as they burn out. Set up your furniture so you have a clear path. Avoid moving your furniture around. If any of your floors are uneven, fix them. If there are any pets around you, be aware of where they are. Review your medicines with your doctor. Some medicines can make you feel dizzy. This can increase your chance of falling. Ask your doctor what other things that you can do to help prevent falls. This information is not intended to replace advice given to you by your health care provider. Make sure you discuss any questions you have with your health care provider. Document Released: 02/02/2009 Document Revised: 09/14/2015 Document Reviewed: 05/13/2014 Elsevier Interactive Patient Education  2017 [employer].

## 2021-04-20 NOTE — Telephone Encounter (Signed)
In completing pt's AWV with pt's [name], we were discussing the Shingrix vaccine. [name] questions if it is safe for her mom to get due to her age and weight of 101# Do you feel like the Shingrix would benefit Dawn Conner?

## 2021-04-20 NOTE — Progress Notes (Signed)
Subjective:   Dawn Conner is a 87 y.o. female who presents for Medicare Annual (Subsequent) preventive examination. Virtual Visit via Telephone Note  I connected with  Dawn Conner on 04/20/21 at 12:00 PM EST by telephone and verified that I am speaking with the correct person using two identifiers.  Location: Patient: Home Provider: [facility] Persons participating in the virtual visit: patient/Nurse Health Advisor   I discussed the limitations, risks, security and privacy concerns of performing an evaluation and management service by telephone and the availability of in person appointments. The patient expressed understanding and agreed to proceed.  Interactive audio and video telecommunications were attempted between this nurse and patient, however failed, due to patient having technical difficulties OR patient did not have access to video capability.  We continued and completed visit with audio only.  Some vital signs may be absent or patient reported.   Dawn Dash, LPN  Review of Systems     Cardiac Risk Factors include: advanced age (>23men, >4 women);hypertension;dyslipidemia;sedentary lifestyle;Other (see comment), Risk factor comments: Vascular Dementia   PHONE VISIT. PT AT HOME. NURSE AT [facility] Objective:    Today's Vitals   04/20/21 1205  Weight: 101 lb (45.8 kg)  Height: 5' (1.524 m)   Body mass index is 19.73 kg/m.  Advanced Directives 04/20/2021  Does Patient Have a Medical Advance Directive? Yes  Type of Estate agent of [place];Living will  Copy of Healthcare Power of Attorney in Chart? Yes - validated most recent copy scanned in chart (See row information)    Current Medications (verified) Outpatient Encounter Medications as of 04/20/2021  Medication Sig   aspirin EC 81 MG tablet Take 81 mg by mouth daily.   calcium carbonate (TUMS - DOSED IN MG ELEMENTAL CALCIUM) 500 MG chewable tablet Chew 1 tablet by mouth. 1-2 meals per day    Cholecalciferol (VITAMIN D3) 25 MCG (1000 UT) CAPS 1 Q AM, 2 Q PM   donepezil (ARICEPT) 5 MG tablet Take 1 tablet (5 mg total) by mouth at bedtime.   levothyroxine (EUTHYROX) 75 MCG tablet Take 1 tablet (75 mcg total) by mouth daily before breakfast.   loperamide (IMODIUM) 2 MG capsule Take by mouth as needed for diarrhea or loose stools.   Multiple Minerals-Vitamins (CALCIUM-MAGNESIUM-ZINC-D3 PO) Take 1-2 tablets by mouth daily.   simvastatin (ZOCOR) 40 MG tablet Take 1 tablet (40 mg total) by mouth daily.   amLODipine (NORVASC) 5 MG tablet Take 1 tablet (5 mg total) by mouth daily. (Patient not taking: Reported on 04/20/2021)   Facility-Administered Encounter Medications as of 04/20/2021  Medication   denosumab (PROLIA) injection 60 mg    Allergies (verified) Sulfa antibiotics   History: Past Medical History:  Diagnosis Date   Dementia (HCC)    Diverticulosis    Hyperlipidemia    Hypertension    Osteoporosis    Oth fracture of shaft of right humerus, init for opn fx 1990's   Renal insufficiency    Thyroid disease    Past Surgical History:  Procedure Laterality Date   ABDOMINAL HYSTERECTOMY  1972   APPENDECTOMY     cataract surgery     Early 2000's both eyes    CHOLECYSTECTOMY  1980's   COLONOSCOPY  03/10/2006   Severe diverticulosis especially involving the sigmoid and to a lesser extent all other segments as well.  There was no evidence of neoplastic pathology. Recommended continuing Metamucil as well as Questran which appeared to keep her fairly regular. Consider repeat  in 5 years.    OVARIAN CYST REMOVAL  09/2012   TONSILLECTOMY  age 33   Family History  Problem Relation Age of Onset   Colon cancer Neg Hx    Social History   Socioeconomic History   Marital status: Widowed    Spouse name: Not on file   Number of children: Not on file   Years of education: Not on file   Highest education level: Not on file  Occupational History   Not on file  Tobacco Use    Smoking status: Never   Smokeless tobacco: Never  Vaping Use   Vaping Use: Never used  Substance and Sexual Activity   Alcohol use: Never   Drug use: Never   Sexual activity: Not Currently  Other Topics Concern   Not on file  Social History Narrative   Lives with daughter Dawn Conner and son in Social worker.    Social Determinants of Health   Financial Resource Strain: Low Risk    Difficulty of Paying Living Expenses: Not hard at all  Food Insecurity: No Food Insecurity   Worried About Programme researcher, broadcasting/film/video in the Last Year: Never true   Ran Out of Food in the Last Year: Never true  Transportation Needs: No Transportation Needs   Lack of Transportation (Medical): No   Lack of Transportation (Non-Medical): No  Physical Activity: Insufficiently Active   Days of Exercise per Week: 4 days   Minutes of Exercise per Session: 10 min  Stress: No Stress Concern Present   Feeling of Stress : Not at all  Social Connections: Moderately Integrated   Frequency of Communication with Friends and Family: Twice a week   Frequency of Social Gatherings with Friends and Family: More than three times a week   Attends Religious Services: 1 to 4 times per year   Active Member of [employer] or Organizations: Yes   Attends Banker Meetings: 1 to 4 times per year   Marital Status: Widowed    Tobacco Counseling Counseling given: Not Answered   Clinical Intake:  Pre-visit preparation completed: Yes  Pain : No/denies pain     BMI - recorded: 19.73 Nutritional Status: BMI of 19-24  Normal Nutritional Risks: None Diabetes: No  How often do you need to have someone help you when you read instructions, pamphlets, or other written materials from your doctor or pharmacy?: 1 - Never  Diabetic?NO  Interpreter Needed?: No  Information entered by :: MJ Aysa Larivee, LPN   Activities of Daily Living In your present state of health, do you have any difficulty performing the following activities: 04/20/2021   Hearing? Y  Vision? N  Difficulty concentrating or making decisions? Y  Comment Pt has vascular dementia.  Walking or climbing stairs? N  Dressing or bathing? Y  Doing errands, shopping? Y  Preparing Food and eating ? N  Using the Toilet? N  In the past six months, have you accidently leaked urine? Y  Do you have problems with loss of bowel control? N  Managing your Medications? Y  Managing your Finances? Y  Housekeeping or managing your Housekeeping? Y  Some recent data might be hidden    Patient Care Team: Donita Brooks, MD as PCP - General (Family Medicine) Lanelle Bal, DO as Consulting Physician (Gastroenterology)  Indicate any recent Medical Services you may have received from other than [facility] providers in the past year (date may be approximate).     Assessment:   This is a  routine wellness examination for Aline.  Hearing/Vision screen Hearing Screening - Comments:: Pt is hard of hearing.  Vision Screening - Comments:: Readers. Dr. Nile Riggs. 2022  Dietary issues and exercise activities discussed: Current Exercise Habits: Home exercise routine, Type of exercise: walking;stretching;Other - see comments (Pt's daugther does help her mom do stretching, chair yoga and some walking every other day.), Time (Minutes): 20, Frequency (Times/Week): 4, Weekly Exercise (Minutes/Week): 80, Intensity: Mild, Exercise limited by: cardiac condition(s);neurologic condition(s)   Goals Addressed             This Visit's Progress    Exercise 3x per week (30 min per time)       Continue to maintain good general wellness.       Depression Screen PHQ 2/9 Scores 04/20/2021 10/13/2020 04/17/2020 01/05/2020 11/09/2018  PHQ - 2 Score 0 0 0 - 0  Exception Documentation - - - Medical reason -    Fall Risk Fall Risk  04/20/2021 10/13/2020 04/17/2020 01/05/2020 11/09/2018  Falls in the past year? 0 0 1 1 0  Number falls in past yr: 0 0 0 1 -  Injury with Fall? 0 0 1 1 -  Comment - -  head injury - -  Risk for fall due to : Impaired balance/gait;Mental status change;History of fall(s) Impaired balance/gait;Impaired mobility - Impaired balance/gait;History of fall(s) -  Follow up Falls prevention discussed Falls evaluation completed - Falls evaluation completed Falls evaluation completed    FALL RISK PREVENTION PERTAINING TO THE HOME:  Any stairs in or around the home? No  If so, are there any without handrails? No  Home free of loose throw rugs in walkways, pet beds, electrical cords, etc? Yes  Adequate lighting in your home to reduce risk of falls? Yes   ASSISTIVE DEVICES UTILIZED TO PREVENT FALLS:  Life alert? No  Use of a cane, walker or w/c? No  Grab bars in the bathroom? Yes  Shower chair or bench in shower? No  Elevated toilet seat or a handicapped toilet? No   TIMED UP AND GO:  Was the test performed? No .  Phone visit.   Cognitive Function:Patient has current diagnosis of cognitive impairment. 04/20/2021. Patient is unable to complete screening 6CIT or MMSE. Pt has current dx of vascular dementia.         Immunizations Immunization History  Administered Date(s) Administered   Fluad Quad(high Dose 65+) 01/05/2020, 03/30/2021   Influenza,inj,Quad PF,6+ Mos 02/08/2019   Influenza-Unspecified 12/23/2016   Pneumococcal Conjugate-13 12/23/2016   Pneumococcal Polysaccharide-23 04/15/2020   Zoster, Live 01/20/2017    TDAP status: Due, Education has been provided regarding the importance of this vaccine. Advised may receive this vaccine at local pharmacy or Health Dept. Aware to provide a copy of the vaccination record if obtained from local pharmacy or Health Dept. Verbalized acceptance and understanding.  Flu Vaccine status: Up to date  Pneumococcal vaccine status: Up to date  Covid-19 vaccine status: Declined, Education has been provided regarding the importance of this vaccine but patient still declined. Advised may receive this vaccine at local  pharmacy or Health Dept.or vaccine clinic. Aware to provide a copy of the vaccination record if obtained from local pharmacy or Health Dept. Verbalized acceptance and understanding.  Qualifies for Shingles Vaccine? Yes   Zostavax completed No   Shingrix Completed?: No.    Education has been provided regarding the importance of this vaccine. Patient has been advised to call insurance company to determine out of pocket expense if they  have not yet received this vaccine. Advised may also receive vaccine at local pharmacy or Health Dept. Verbalized acceptance and understanding.  Screening Tests Health Maintenance  Topic Date Due   COVID-19 Vaccine (1) Never done   TETANUS/TDAP  Never done   Zoster Vaccines- Shingrix (1 of 2) Never done   Pneumonia Vaccine 39+ Years old  Completed   INFLUENZA VACCINE  Completed   DEXA SCAN  Completed   HPV VACCINES  Aged Out    Health Maintenance  Health Maintenance Due  Topic Date Due   COVID-19 Vaccine (1) Never done   TETANUS/TDAP  Never done   Zoster Vaccines- Shingrix (1 of 2) Never done    Colorectal cancer screening: No longer required.   Mammogram status: No longer required due to AGE.  Bone Density status: Completed 02/17/2017. Results reflect: Bone density results: OSTEOPOROSIS. Repeat every NO LONGER REQUIRED DUE TO AGE years.  Lung Cancer Screening: (Low Dose CT Chest recommended if Age 19-80 years, 30 pack-year currently smoking OR have quit w/in 15years.) does not qualify.    Additional Screening:  Hepatitis C Screening: does not qualify.  Vision Screening: Recommended annual ophthalmology exams for early detection of glaucoma and other disorders of the eye. Is the patient up to date with their annual eye exam?  Yes  Who is the provider or what is the name of the office in which the patient attends Dr. Nile Riggs If pt is not established with a provider, would they like to be referred to a provider to establish care? No .   Dental  Screening: Recommended annual dental exams for proper oral hygiene  Community Resource Referral / Chronic Care Management: CRR required this visit?  No   CCM required this visit?  No      Plan:     I have personally reviewed and noted the following in the patients chart:   Medical and social history Use of alcohol, tobacco or illicit drugs  Current medications and supplements including opioid prescriptions.  Functional ability and status Nutritional status Physical activity Advanced directives List of other physicians Hospitalizations, surgeries, and ER visits in previous 12 months Vitals Screenings to include cognitive, depression, and falls Referrals and appointments  In addition, I have reviewed and discussed with patient certain preventive protocols, quality metrics, and best practice recommendations. A written personalized care plan for preventive services as well as general preventive health recommendations were provided to patient.     Dawn Dash, LPN   16/01/[id number]   Nurse Notes: Phone visit completed with pt and pt's daughter, Dawn Conner. Pt is up to date on age appropriate health maintenance and vaccines. 6CIT not done due to pt's dx of vascular dementia and pt would be unable to complete.

## 2021-05-21 NOTE — Progress Notes (Signed)
Primary Care Physician:  Donita Brooks, MD  Primary GI: Dr. Marletta Lor  Patient Location: Home   Provider Location: [facility] office   Reason for Visit: Follow-up of alternating constipation and diarrhea and left sided abdominal pain.    Persons present on the virtual encounter, with roles: Ermalinda Memos, PA-C (provider), Dawn Conner (patient), [name] (patient's daughter).   Total time (minutes) spent on medical discussion: 10 minutes  Virtual Visit via Video Note Due to COVID-19, visit is conducted virtually and was requested by patient.   I connected with Dawn Conner on 05/23/21 at  9:00 AM EST by video and verified that I am speaking with the correct person using two identifiers.   I discussed the limitations, risks, security and privacy concerns of performing an evaluation and management service by video and the availability of in person appointments. I also discussed with the patient that there may be a patient responsible charge related to this service. The patient expressed understanding and agreed to proceed.  Chief Complaint  Patient presents with   Diarrhea    Not really having right now. Just having some small BM's at times. No bleeding/no dark stools. No nausea, no vomiting, no abd pain     History of Present Illness: Dawn Conner is a 88 y.o. female with history of dementia, hypothyroidism, HTN, HLD, osteoporosis, and chronic history of intermittent diarrhea likely secondary to lactose intolerance and bile salt diarrhea s/p cholecystectomy.  Previously, TSH wnl on medications and celiac screen negative.  Symptoms had improved with lactose-free diet and Tums before meals.  She was last seen in our office in October 2022.  She had had a few episodes of diarrhea with associated left-sided abdominal discomfort that resolved after a bowel movement in September.  At the time of her office visit, she had not had a recurrent episode of urgent mushy stool since the week prior.   Noted mild upper abdominal discomfort prior to bowel movements when having looser stool that resolves with a bowel movement.  Typically, stools were soft or mushy with 1 BM per day which she felt was complete.  Skipping 1-2 days/week without a bowel movement.  No BRBPR or melena.  Weight was stable.  She had not started Benefiber as previously recommended.  Overall, suspected breakthrough symptoms were secondary to dietary intolerances, possible component of IBS.  Advised to start Benefiber daily, continue lactose-free diet and Tums before meals, Imodium as needed, and monitor for worsening of abdominal pain or diarrhea.  Follow-up as needed.  Patient's daughter called 03/26/2021 stating her mother was still having pain in her side which was worse when bending over.  When the nurse called back to discuss further, patient's daughter stated her mother was feeling a little better after having another bowel movement.  No associated abdominal pain with meals.  Recommended abdominal x-ray to evaluate for large stool burden.    Abdominal x-ray revealed mild fecal loading in the region of the rectum and sigmoid colon.  Patient's daughter stated she was taking Benefiber once daily if that as she was not always there to make sure her mother took the medication, but she was going to work harder on this.  Recommended adding MiraLAX daily if Benefiber alone did not help.  Today:  Daughter provides history today.  Left-sided abdominal pain has resolved.  They are trying to do better with taking Benefiber.  They have trouble with getting the recommended dose of powdered Benefiber and her and have purchased  Benefiber Gummies.  She is taking 1 teaspoon of powdered Benefiber in the morning and 1 Benefiber gummy daily.  With this, she does feel her bowels are moving better.  Occasionally, she does have small bowel movements, but she is no longer skipping days between bowel movements.  She is not having any abdominal pain,  BRBPR, melena, nausea, vomiting, unintentional weight loss.  No recent episodes of diarrhea avoiding dairy products and taking Tums before meals.  Past Medical History:  Diagnosis Date   Dementia ([place])    Diverticulosis    Hyperlipidemia    Hypertension    Osteoporosis    Oth fracture of shaft of right humerus, init for opn fx 1990's   Renal insufficiency    Thyroid disease      Past Surgical History:  Procedure Laterality Date   ABDOMINAL HYSTERECTOMY  1972   APPENDECTOMY     cataract surgery     Early 2000's both eyes    CHOLECYSTECTOMY  1980's   COLONOSCOPY  03/10/2006   [place]; severe diverticulosis especially involving the sigmoid and to a lesser extent all other segments as well.  There was no evidence of neoplastic pathology. Recommended continuing Metamucil as well as Questran which appeared to keep her fairly regular. Consider repeat in 5 years.   OVARIAN CYST REMOVAL  09/2012   TONSILLECTOMY  age 88     Current Meds  Medication Sig   aspirin EC 81 MG tablet Take 81 mg by mouth every other day.   calcium carbonate (TUMS - DOSED IN MG ELEMENTAL CALCIUM) 500 MG chewable tablet Chew 1 tablet by mouth. 1-2 meals per day   Cholecalciferol (VITAMIN D3) 25 MCG (1000 UT) CAPS 1 Q AM, 2 Q PM   donepezil (ARICEPT) 5 MG tablet Take 1 tablet (5 mg total) by mouth at bedtime.   levothyroxine (EUTHYROX) 75 MCG tablet Take 1 tablet (75 mcg total) by mouth daily before breakfast.   loperamide (IMODIUM) 2 MG capsule Take by mouth as needed for diarrhea or loose stools.   Multiple Minerals-Vitamins (CALCIUM-MAGNESIUM-ZINC-D3 PO) Take 1-2 tablets by mouth daily.   simvastatin (ZOCOR) 40 MG tablet Take 1 tablet (40 mg total) by mouth daily. (Patient taking differently: Take 10 mg by mouth daily.)   Current Facility-Administered Medications for the 05/23/21 encounter (Video Visit) with Erenest Rasher, PA-C  Medication   denosumab (PROLIA) injection 60 mg     Family History   Problem Relation Age of Onset   Colon cancer Neg Hx     Social History   Socioeconomic History   Marital status: Widowed    Spouse name: Not on file   Number of children: Not on file   Years of education: Not on file   Highest education level: Not on file  Occupational History   Not on file  Tobacco Use   Smoking status: Never   Smokeless tobacco: Never  Vaping Use   Vaping Use: Never used  Substance and Sexual Activity   Alcohol use: Never   Drug use: Never   Sexual activity: Not Currently  Other Topics Concern   Not on file  Social History Narrative   Lives with daughter Arbie Cookey and son in Sports coach.    Social Determinants of Health   Financial Resource Strain: Low Risk    Difficulty of Paying Living Expenses: Not hard at all  Food Insecurity: No Food Insecurity   Worried About Charity fundraiser in the Last Year: Never true  Ran Out of Food in the Last Year: Never true  Transportation Needs: No Transportation Needs   Lack of Transportation (Medical): No   Lack of Transportation (Non-Medical): No  Physical Activity: Insufficiently Active   Days of Exercise per Week: 4 days   Minutes of Exercise per Session: 10 min  Stress: No Stress Concern Present   Feeling of Stress : Not at all  Social Connections: Moderately Integrated   Frequency of Communication with Friends and Family: Twice a week   Frequency of Social Gatherings with Friends and Family: More than three times a week   Attends Religious Services: 1 to 4 times per year   Active Member of [employer] or Organizations: Yes   Attends Archivist Meetings: 1 to 4 times per year   Marital Status: Widowed       Review of Systems: Gen: Denies fever, chills, cold or flulike symptoms. GI: see HPI Heme: Denies BRBPR or melena.  Observations/Objective: No distress. Alert and oriented. Unable to perform complete physical exam due to video encounter.   Assessment and Plan: 88 y.o. female with history of  dementia, hypothyroidism, HTN, HLD, osteoporosis, and chronic history of intermittent diarrhea likely secondary to lactose intolerance and bile salt diarrhea s/p cholecystectomy, well controlled with tums before meals and a lactose free diet.  Previously, TSH within normal limits on medication and celiac screen negative.  She has been struggling with some intermittent constipation with associated left-sided abdominal discomfort previously.  Abdominal x-ray for the symptoms in December 2022 revealed mild fecal loading in the region of the rectum and sigmoid colon, no other abnormalities.  Her daughter has been more regimented with trying to get patient to take Benefiber on a regular basis.  They have trouble getting her to take powdered Benefiber.  She is currently taking 1 teaspoon of powdered Benefiber and also started taking 1 Benefiber gummy 2 weeks ago.  With this, her bowels are moving better, occasional small bowel movements, but no longer skipping days without a bowel movement.  Abdominal pain has resolved.  No alarm symptoms and no other significant GI symptoms.  At this time, I recommended she continue lactose-free diet, Tums before meals, and use Benefiber Gummies rather than Benefiber powder as the patient tolerates this better.  Recommended taking 2 Benefiber Gummies in the morning.  Can increase to a max of 4 Gummies per day if needed.  If Benefiber alone does not control constipation or she has breakthrough, advised that she can use MiraLAX 1 capful (17 g) in 8 ounces of water daily as needed.   Plan to follow-up in 6 months or sooner if needed.   Follow Up Instructions: Follow-up in 6 months or sooner if needed.   I discussed the assessment and treatment plan with the patient. The patient was provided an opportunity to ask questions and all were answered. The patient agreed with the plan and demonstrated an understanding of the instructions.   The patient was advised to call back or seek  an in-person evaluation if the symptoms worsen or if the condition fails to improve as anticipated.  I provided 10 minutes of video-face-to-face time during this encounter.  Aliene Altes, PA-C [facility]

## 2021-05-23 ENCOUNTER — Encounter: Payer: Self-pay | Admitting: Gastroenterology

## 2021-05-23 ENCOUNTER — Telehealth: Payer: Self-pay | Admitting: *Deleted

## 2021-05-23 ENCOUNTER — Telehealth (INDEPENDENT_AMBULATORY_CARE_PROVIDER_SITE_OTHER): Payer: Medicare Other | Admitting: Gastroenterology

## 2021-05-23 ENCOUNTER — Other Ambulatory Visit: Payer: Self-pay

## 2021-05-23 DIAGNOSIS — R198 Other specified symptoms and signs involving the digestive system and abdomen: Secondary | ICD-10-CM

## 2021-05-23 NOTE — Telephone Encounter (Signed)
Dawn Conner, you are scheduled for a virtual visit with your provider today.  Just as we do with appointments in the office, we must obtain your consent to participate.  Your consent will be active for this visit and any virtual visit you may have with one of our providers in the next 365 days.  If you have a MyChart account, I can also send a copy of this consent to you electronically.  All virtual visits are billed to your insurance company just like a traditional visit in the office.  As this is a virtual visit, video technology does not allow for your provider to perform a traditional examination.  This may limit your provider's ability to fully assess your condition.  If your provider identifies any concerns that need to be evaluated in person or the need to arrange testing such as labs, EKG, etc, we will make arrangements to do so.  Although advances in technology are sophisticated, we cannot ensure that it will always work on either your end or our end.  If the connection with a video visit is poor, we may have to switch to a telephone visit.  With either a video or telephone visit, we are not always able to ensure that we have a secure connection.   I need to obtain your verbal consent now.   Are you willing to proceed with your visit today?

## 2021-05-23 NOTE — Patient Instructions (Signed)
As you did better with Benefiber Gummies, you can take 2 Benefiber Gummies in the morning.  If you start to notice more frequent small bowel movements or skipping days between bowel movements, you can increase up to 4 Gummies per day.   If Benefiber alone is not working well enough, you can use MiraLAX 1 capful (17 g) in 8 ounces of water daily as needed.  We will follow-up with you in about 6 months.  We will receive a letter in the mail when it is time to schedule the appointment.  Do not hesitate to call if you have any questions or concerns prior to your next visit.  It was good talking with you today!    Ermalinda Memos, PA-C [facility]

## 2021-05-23 NOTE — Telephone Encounter (Signed)
Pt consented to a virtual visit. 

## 2021-06-30 ENCOUNTER — Other Ambulatory Visit: Payer: Self-pay | Admitting: Family Medicine

## 2021-07-26 ENCOUNTER — Encounter: Payer: Self-pay | Admitting: Family Medicine

## 2021-07-26 MED ORDER — TUBERCULIN PPD 5 UNIT/0.1ML ID SOLN: 5.0000 [IU] | Freq: Once | INTRADERMAL | 0 refills | Status: AC

## 2021-07-26 NOTE — Telephone Encounter (Signed)
Please advise on request for TB skin test order, thanks! ?

## 2021-07-26 NOTE — Telephone Encounter (Signed)
Please see previous My Chart message.

## 2021-07-28 ENCOUNTER — Other Ambulatory Visit: Payer: Self-pay

## 2021-07-28 ENCOUNTER — Encounter: Payer: Self-pay | Admitting: Emergency Medicine

## 2021-07-28 ENCOUNTER — Ambulatory Visit
Admission: EM | Admit: 2021-07-28 | Discharge: 2021-07-28 | Disposition: A | Discharge status: Home / Self Care | Payer: Medicare Other | Attending: Family Medicine | Admitting: Family Medicine

## 2021-07-28 DIAGNOSIS — H1032 Unspecified acute conjunctivitis, left eye: Secondary | ICD-10-CM | POA: Diagnosis not present

## 2021-07-28 DIAGNOSIS — R030 Elevated blood-pressure reading, without diagnosis of hypertension: Secondary | ICD-10-CM | POA: Diagnosis not present

## 2021-07-28 MED ORDER — ERYTHROMYCIN 5 MG/GM OP OINT: TOPICAL_OINTMENT | OPHTHALMIC | 0 refills | Status: DC

## 2021-07-28 NOTE — ED Triage Notes (Signed)
Pt family reports pt has been complaining of left eye pain/irritation for last several days.  ?

## 2021-07-28 NOTE — Discharge Instructions (Signed)
Follow-up with your primary care provider next week for a recheck on your symptoms.  Go to the emergency department if your symptoms significantly worsen, including loss of vision or severe headache ?

## 2021-07-28 NOTE — ED Provider Notes (Signed)
?[facility] ? ? ? ?CSN: [id number] ?Arrival date & time: 07/28/21  1011 ? ? ?  ? ?History   ?Chief Complaint ?Chief Complaint  ?Patient presents with  ? Eye Pain  ? ? ?HPI ?Dawn Conner is a 88 y.o. female.  ? ?Presenting today with daughter for evaluation of 1 day history of left eye irritation, drainage.  Denies injury to the eye, new medications or hygiene products, sick contacts.  Also denies history of fever, headache, visual change, upper respiratory symptoms.  Tried some Visine drops last night with no relief. ? ? ?Past Medical History:  ?Diagnosis Date  ? Dementia (HCC)   ? Diverticulosis   ? Hyperlipidemia   ? Hypertension   ? Osteoporosis   ? Oth fracture of shaft of right humerus, init for opn fx 1990's  ? Renal insufficiency   ? Thyroid disease   ? ? ?Patient Active Problem List  ? Diagnosis Date Noted  ? Alternating constipation and diarrhea 01/22/2021  ? Vascular dementia (HCC) 10/17/2019  ? Diarrhea 05/19/2019  ? Hypertension 11/09/2018  ? Osteoporosis 11/09/2018  ? Hypothyroidism 11/09/2018  ? Hyperlipidemia 11/09/2018  ? Chronic diarrhea 11/09/2018  ? ? ?Past Surgical History:  ?Procedure Laterality Date  ? ABDOMINAL HYSTERECTOMY  1972  ? APPENDECTOMY    ? cataract surgery    ? Early 2000's both eyes   ? CHOLECYSTECTOMY  1980's  ? COLON[place]  03/10/2006  [place] severe diverticulosis especially involving the sigmoid and to a lesser extent all other segments as well.  There was no evidence of neoplastic pathology. Recommended continuing Metamucil as well as Questran which appeared to keep her fairly regular. Consider repeat in 5 years.  ? OVARIAN CYST REMOVAL  09/2012  ? TONSILLECTOMY  age 88  ? ? ?OB History   ?No obstetric history on file. ?  ? ? ? ?Home Medications   ? ?Prior to Admission medications   ?Medication Sig Start Date End Date Taking? Authorizing Provider  ?erythromycin ophthalmic ointment Place a 1/2 inch ribbon of ointment into the left lower eyelid BID prn. 07/28/21   Yes Particia NearingLane, Jariel Drost Elizabeth, PA-C  ?aspirin EC 81 MG tablet Take 81 mg by mouth every other day.    [provider]  ?calcium carbonate (TUMS - DOSED IN MG ELEMENTAL CALCIUM) 500 MG chewable tablet Chew 1 tablet by mouth. 1-2 meals per day    [provider]  ?Cholecalciferol (VITAMIN D3) 25 MCG (1000 UT) CAPS 1 Q AM, 2 Q PM 10/16/20   Donita BrooksPickard, Warren T, MD  ?donepezil (ARICEPT) 5 MG tablet Take 1 tablet (5 mg total) by mouth at bedtime. 01/22/21   Donita BrooksPickard, Warren T, MD  ?levothyroxine (SYNTHROID) 75 MCG tablet TAKE 1 TABLET BY MOUTH BEFORE BREAKFAST 07/02/21   Donita BrooksPickard, Warren T, MD  ?loperamide (IMODIUM) 2 MG capsule Take by mouth as needed for diarrhea or loose stools.    [provider]  ?Multiple Minerals-Vitamins (CALCIUM-MAGNESIUM-ZINC-D3 PO) Take 1-2 tablets by mouth daily.    [provider]  ?simvastatin (ZOCOR) 40 MG tablet Take 1 tablet (40 mg total) by mouth daily. ?Patient taking differently: Take 10 mg by mouth daily. 12/14/20   Donita BrooksPickard, Warren T, MD  ?Wheat Dextrin (BENEFIBER PO) Take by mouth. Benefiber Gummy- 2 daily, max of 4 per day.    [provider]  ? ? ?Family History ?Family History  ?Problem Relation Age of Onset  ? Colon cancer Neg Hx   ? ? ?Social History ?Social  History  ? ?Tobacco Use  ? Smoking status: Never  ? Smokeless tobacco: Never  ?Vaping Use  ? Vaping Use: Never used  ?Substance Use Topics  ? Alcohol use: Never  ? Drug use: Never  ? ? ? ?Allergies   ?Sulfa antibiotics ? ? ?Review of Systems ?Review of Systems ?Per HPI ? ?Physical Exam ?Triage Vital Signs ?ED Triage Vitals [07/28/21 1135]  ?Enc Vitals Group  ?   BP (!) 188/90  ?   Pulse Rate 77  ?   Resp 18  ?   Temp 98.6 ?F (37 ?C)  ?   Temp Source Oral  ?   SpO2 98 %  ?   Weight 102 lb (46.3 kg)  ?   Height 5' (1.524 m)  ?   Head Circumference   ?   Peak Flow   ?   Pain Score 0  ?   Pain Loc   ?   Pain Edu?   ?   Excl. in GC?   ? ?No data found. ? ?Updated Vital Signs ?BP (!) 162/86 (BP  Location: Right Arm)   Pulse 77   Temp 98.6 ?F (37 ?C) (Oral)   Resp 18   Ht 5' (1.524 m)   Wt 102 lb (46.3 kg)   SpO2 98%   BMI 19.92 kg/m?  ? ?Visual Acuity ?Right Eye Distance:   ?Left Eye Distance:   ?Bilateral Distance:   ? ?Right Eye Near:   ?Left Eye Near:    ?Bilateral Near:    ? ?Physical Exam ?Vitals and nursing note reviewed.  ?Constitutional:   ?   Appearance: Normal appearance. She is not ill-appearing.  ?HENT:  ?   Head: Atraumatic.  ?Eyes:  ?   Extraocular Movements: Extraocular movements intact.  ?   Pupils: Pupils are equal, round, and reactive to light.  ?   Comments: Left conjunctiva injected, upper and lower lids mildly erythematous and edematous at lash lines  ?Cardiovascular:  ?   Rate and Rhythm: Normal rate and regular rhythm.  ?   Heart sounds: Normal heart sounds.  ?Pulmonary:  ?   Effort: Pulmonary effort is normal.  ?   Breath sounds: Normal breath sounds.  ?Musculoskeletal:  ?   Cervical back: Normal range of motion and neck supple.  ?   Comments: Range of motion at baseline  ?Skin: ?   General: Skin is warm and dry.  ?Neurological:  ?   Mental Status: She is alert. Mental status is at baseline.  ?Psychiatric:     ?   Behavior: Behavior normal.  ? ? ?UC Treatments / Results  ?Labs ?(all labs ordered are listed, but only abnormal results are displayed) ?Labs Reviewed - No data to display ? ?EKG ? ? ?Radiology ?No results found. ? ?Procedures ?Procedures (including critical care time) ? ?Medications Ordered in UC ?Medications - No data to display ? ?Initial Impression / Assessment and Plan / UC Course  ?I have reviewed the triage vital signs and the nursing notes. ? ?Pertinent labs & imaging results that were available during my care of the patient were reviewed by me and considered in my medical decision making (see chart for details). ? ?  ? ?Blood pressure elevated today in triage, slightly improved on recheck.  Patient currently asymptomatic with regard to chest pain, shortness  of breath, dizziness and states she is very nervous to be at the doctor and thinks that is what is going on.  Discussed close  follow-up with PCP if home readings stay elevated this way.  Regarding her eye pain, possibly early conjunctivitis, treat with erythromycin ointment for coverage of bacterial infection and comfort, warm compresses, Tylenol as needed.  Close PCP follow-up next week recommended. ? ?Final Clinical Impressions(s) / UC Diagnoses  ? ?Final diagnoses:  ?Acute conjunctivitis of left eye, unspecified acute conjunctivitis type  ?Elevated blood pressure reading  ? ? ? ?Discharge Instructions   ? ?  ?Follow-up with your primary care provider next week for a recheck on your symptoms.  Go to the emergency department if your symptoms significantly worsen, including loss of vision or severe headache ? ? ? ?ED Prescriptions   ? ? Medication Sig Dispense Auth. Provider  ? erythromycin ophthalmic ointment Place a 1/2 inch ribbon of ointment into the left lower eyelid BID prn. 3.5 g Particia Nearing, PA-C  ? ?  ? ?PDMP not reviewed this encounter. ?  ?Particia Nearing, PA-C ?07/28/21 1213 ? ?

## 2021-07-30 ENCOUNTER — Telehealth: Payer: Self-pay | Admitting: Family Medicine

## 2021-07-30 ENCOUNTER — Encounter: Payer: Self-pay | Admitting: Family Medicine

## 2021-07-30 NOTE — Telephone Encounter (Signed)
Received call from Cheney from [facility] to follow up on FL2 form; needs completed form by tomorrow. Please fax to [phone].  ? ?Please advise Crystal at [phone]. ?

## 2021-07-31 ENCOUNTER — Encounter: Payer: Self-pay | Admitting: Family Medicine

## 2021-08-02 ENCOUNTER — Telehealth: Payer: Self-pay

## 2021-08-02 MED ORDER — SIMVASTATIN 20 MG PO TABS: 20.0000 mg | ORAL_TABLET | Freq: Every day | ORAL | 3 refills | Status: DC

## 2021-08-02 NOTE — Telephone Encounter (Signed)
Per LOV 04/09/21: ?I have recommended stopping amlodipine.  Due to her advanced age, and her previously well-controlled cholesterol and decreasing oral intake, I recommended reducing the dose of simvastatin to 20 mg a day. ? ?At [facility] visit 07/28/21 patient's daughter advised staff that patient "is taking half her dose", so they notated patient is taking 10mg . This was an ERROR. ? ?New rx ordered for Simvastatin 20mg  per LOV note. Sent to pharmacy and faxed via Epic to [place]. ? ?Spoke with patient's daughter, , and [place] at [facility]. Both voiced understanding. ? ?Nothing further needed at this time.  ? ?

## 2021-08-02 NOTE — Telephone Encounter (Signed)
Received voicemail message from [facility] at [place] to follow up on simvastatin; patient has been with them since Monday (daughter having eye surgery). Marcelino Duster requesting call back for proper dosing.  ? ?Please advise at [phone] ?

## 2021-08-02 NOTE — Telephone Encounter (Signed)
Pt's daughter came in to office to inquire about this med simvastatin (ZOCOR) 40 MG tablet for pt. Pt is currently at a snf and the med listed at this does not match the 10 mg dosage that pt is currently taking. Pt's daughter asks if the dosage amount can be changed so that she can provide this med to the facility. Please advise. ? ?Cb#: [phone] ? ?

## 2021-08-14 NOTE — Telephone Encounter (Signed)
Per Crystal at [place], [place] already has been rec' ?

## 2021-09-17 ENCOUNTER — Observation Stay (HOSPITAL_COMMUNITY)
Admission: EM | Admit: 2021-09-17 | Discharge: 2021-09-18 | Disposition: A | Discharge status: Home Health | Payer: Medicare Other | Attending: Family Medicine | Admitting: Family Medicine

## 2021-09-17 ENCOUNTER — Encounter (HOSPITAL_COMMUNITY): Payer: Self-pay

## 2021-09-17 ENCOUNTER — Emergency Department (HOSPITAL_COMMUNITY): Payer: Medicare Other

## 2021-09-17 ENCOUNTER — Other Ambulatory Visit: Payer: Self-pay

## 2021-09-17 DIAGNOSIS — R739 Hyperglycemia, unspecified: Secondary | ICD-10-CM | POA: Diagnosis not present

## 2021-09-17 DIAGNOSIS — Z7982 Long term (current) use of aspirin: Secondary | ICD-10-CM | POA: Diagnosis not present

## 2021-09-17 DIAGNOSIS — I10 Essential (primary) hypertension: Secondary | ICD-10-CM | POA: Diagnosis not present

## 2021-09-17 DIAGNOSIS — D72829 Elevated white blood cell count, unspecified: Secondary | ICD-10-CM | POA: Diagnosis not present

## 2021-09-17 DIAGNOSIS — F0150 Vascular dementia without behavioral disturbance: Secondary | ICD-10-CM | POA: Diagnosis present

## 2021-09-17 DIAGNOSIS — R7303 Prediabetes: Secondary | ICD-10-CM | POA: Diagnosis not present

## 2021-09-17 DIAGNOSIS — M6281 Muscle weakness (generalized): Secondary | ICD-10-CM | POA: Diagnosis present

## 2021-09-17 DIAGNOSIS — R4182 Altered mental status, unspecified: Secondary | ICD-10-CM | POA: Diagnosis present

## 2021-09-17 DIAGNOSIS — R509 Fever, unspecified: Secondary | ICD-10-CM | POA: Diagnosis present

## 2021-09-17 DIAGNOSIS — R6510 Systemic inflammatory response syndrome (SIRS) of non-infectious origin without acute organ dysfunction: Secondary | ICD-10-CM | POA: Diagnosis not present

## 2021-09-17 DIAGNOSIS — F4489 Other dissociative and conversion disorders: Secondary | ICD-10-CM | POA: Diagnosis not present

## 2021-09-17 DIAGNOSIS — Z79899 Other long term (current) drug therapy: Secondary | ICD-10-CM | POA: Insufficient documentation

## 2021-09-17 DIAGNOSIS — R627 Adult failure to thrive: Secondary | ICD-10-CM | POA: Diagnosis not present

## 2021-09-17 DIAGNOSIS — R198 Other specified symptoms and signs involving the digestive system and abdomen: Secondary | ICD-10-CM | POA: Diagnosis not present

## 2021-09-17 DIAGNOSIS — E039 Hypothyroidism, unspecified: Secondary | ICD-10-CM | POA: Diagnosis present

## 2021-09-17 DIAGNOSIS — E785 Hyperlipidemia, unspecified: Secondary | ICD-10-CM | POA: Diagnosis present

## 2021-09-17 DIAGNOSIS — R531 Weakness: Secondary | ICD-10-CM

## 2021-09-17 DIAGNOSIS — Z20822 Contact with and (suspected) exposure to covid-19: Secondary | ICD-10-CM | POA: Insufficient documentation

## 2021-09-17 LAB — SARS CORONAVIRUS 2 BY RT PCR: SARS Coronavirus 2 by RT PCR: NEGATIVE

## 2021-09-17 LAB — URINALYSIS, ROUTINE W REFLEX MICROSCOPIC
Bacteria, UA: NONE SEEN
Bilirubin Urine: NEGATIVE
Glucose, UA: NEGATIVE mg/dL
Ketones, ur: 5 mg/dL — AB
Leukocytes,Ua: NEGATIVE
Nitrite: NEGATIVE
Protein, ur: 30 mg/dL — AB
Specific Gravity, Urine: 1.011 (ref 1.005–1.030)
pH: 7 (ref 5.0–8.0)

## 2021-09-17 LAB — PROCALCITONIN: Procalcitonin: 0.42 ng/mL

## 2021-09-17 LAB — HEMOGLOBIN A1C
Hgb A1c MFr Bld: 6.2 % — ABNORMAL HIGH (ref 4.8–5.6)
Mean Plasma Glucose: 131.24 mg/dL

## 2021-09-17 LAB — COMPREHENSIVE METABOLIC PANEL
ALT: 18 U/L (ref 0–44)
AST: 30 U/L (ref 15–41)
Albumin: 3.9 g/dL (ref 3.5–5.0)
Alkaline Phosphatase: 58 U/L (ref 38–126)
Anion gap: 8 (ref 5–15)
BUN: 22 mg/dL (ref 8–23)
CO2: 25 mmol/L (ref 22–32)
Calcium: 9.4 mg/dL (ref 8.9–10.3)
Chloride: 104 mmol/L (ref 98–111)
Creatinine, Ser: 1.14 mg/dL — ABNORMAL HIGH (ref 0.44–1.00)
GFR, Estimated: 45 mL/min — ABNORMAL LOW (ref >60)
Glucose, Bld: 177 mg/dL — ABNORMAL HIGH (ref 70–99)
Potassium: 3.8 mmol/L (ref 3.5–5.1)
Sodium: 137 mmol/L (ref 135–145)
Total Bilirubin: 0.9 mg/dL (ref 0.3–1.2)
Total Protein: 7.8 g/dL (ref 6.5–8.1)

## 2021-09-17 LAB — PROTIME-INR
INR: 1 (ref 0.8–1.2)
Prothrombin Time: 13.3 seconds (ref 11.4–15.2)

## 2021-09-17 LAB — CBC WITH DIFFERENTIAL/PLATELET
Abs Immature Granulocytes: 0.06 10*3/uL (ref 0.00–0.07)
Basophils Absolute: 0 10*3/uL (ref 0.0–0.1)
Basophils Relative: 0 %
Eosinophils Absolute: 0.1 10*3/uL (ref 0.0–0.5)
Eosinophils Relative: 1 %
HCT: 41.8 % (ref 36.0–46.0)
Hemoglobin: 13.2 g/dL (ref 12.0–15.0)
Immature Granulocytes: 0 %
Lymphocytes Relative: 3 %
Lymphs Abs: 0.5 10*3/uL — ABNORMAL LOW (ref 0.7–4.0)
MCH: 27.6 pg (ref 26.0–34.0)
MCHC: 31.6 g/dL (ref 30.0–36.0)
MCV: 87.4 fL (ref 80.0–100.0)
Monocytes Absolute: 0.8 10*3/uL (ref 0.1–1.0)
Monocytes Relative: 5 %
Neutro Abs: 13.6 10*3/uL — ABNORMAL HIGH (ref 1.7–7.7)
Neutrophils Relative %: 91 %
Platelets: 175 10*3/uL (ref 150–400)
RBC: 4.78 MIL/uL (ref 3.87–5.11)
RDW: 14.9 % (ref 11.5–15.5)
WBC: 15.1 10*3/uL — ABNORMAL HIGH (ref 4.0–10.5)
nRBC: 0 % (ref 0.0–0.2)

## 2021-09-17 LAB — CBG MONITORING, ED: Glucose-Capillary: 190 mg/dL — ABNORMAL HIGH (ref 70–99)

## 2021-09-17 LAB — LACTIC ACID, PLASMA
Lactic Acid, Venous: 1.5 mmol/L (ref 0.5–1.9)
Lactic Acid, Venous: 1.4 mmol/L (ref 0.5–1.9)

## 2021-09-17 LAB — GLUCOSE, CAPILLARY: Glucose-Capillary: 123 mg/dL — ABNORMAL HIGH (ref 70–99)

## 2021-09-17 LAB — APTT: aPTT: 25 seconds (ref 24–36)

## 2021-09-17 MED ORDER — HYDROCODONE-ACETAMINOPHEN 5-325 MG PO TABS: 1.0000 | ORAL_TABLET | Freq: Four times a day (QID) | ORAL | Status: DC | PRN

## 2021-09-17 MED ORDER — AMLODIPINE BESYLATE 5 MG PO TABS
5.0000 mg | ORAL_TABLET | Freq: Every day | ORAL | Status: DC
  Administered 2021-09-17 – 2021-09-18 (×2): 5 mg via ORAL
  Filled 2021-09-17 (×2): qty 1

## 2021-09-17 MED ORDER — SIMVASTATIN 20 MG PO TABS
20.0000 mg | ORAL_TABLET | Freq: Every day | ORAL | Status: DC
  Administered 2021-09-17: 20 mg via ORAL
  Filled 2021-09-17: qty 1

## 2021-09-17 MED ORDER — FENTANYL CITRATE PF 50 MCG/ML IJ SOSY: 12.5000 ug | PREFILLED_SYRINGE | INTRAMUSCULAR | Status: DC | PRN

## 2021-09-17 MED ORDER — BISACODYL 5 MG PO TBEC: 5.0000 mg | DELAYED_RELEASE_TABLET | Freq: Every day | ORAL | Status: DC | PRN

## 2021-09-17 MED ORDER — LEVOTHYROXINE SODIUM 75 MCG PO TABS
75.0000 ug | ORAL_TABLET | Freq: Every day | ORAL | Status: DC
  Administered 2021-09-17 – 2021-09-18 (×2): 75 ug via ORAL
  Filled 2021-09-17: qty 1
  Filled 2021-09-17: qty 2

## 2021-09-17 MED ORDER — INSULIN ASPART 100 UNIT/ML IJ SOLN: 0.0000 [IU] | Freq: Three times a day (TID) | INTRAMUSCULAR | Status: DC

## 2021-09-17 MED ORDER — ONDANSETRON HCL 4 MG/2ML IJ SOLN: 4.0000 mg | Freq: Four times a day (QID) | INTRAMUSCULAR | Status: DC | PRN

## 2021-09-17 MED ORDER — ONDANSETRON HCL 4 MG PO TABS: 4.0000 mg | ORAL_TABLET | Freq: Four times a day (QID) | ORAL | Status: DC | PRN

## 2021-09-17 MED ORDER — ACETAMINOPHEN 650 MG RE SUPP: 650.0000 mg | Freq: Four times a day (QID) | RECTAL | Status: DC | PRN

## 2021-09-17 MED ORDER — DOXYCYCLINE HYCLATE 100 MG PO TABS
100.0000 mg | ORAL_TABLET | Freq: Two times a day (BID) | ORAL | Status: DC
  Administered 2021-09-17 – 2021-09-18 (×3): 100 mg via ORAL
  Filled 2021-09-17 (×3): qty 1

## 2021-09-17 MED ORDER — ACETAMINOPHEN 325 MG PO TABS
650.0000 mg | ORAL_TABLET | Freq: Once | ORAL | Status: AC
  Administered 2021-09-17: 650 mg via ORAL
  Filled 2021-09-17: qty 2

## 2021-09-17 MED ORDER — ENSURE ENLIVE PO LIQD
237.0000 mL | Freq: Three times a day (TID) | ORAL | Status: DC
  Administered 2021-09-17 (×2): 237 mL via ORAL

## 2021-09-17 MED ORDER — ACETAMINOPHEN 325 MG PO TABS
650.0000 mg | ORAL_TABLET | Freq: Four times a day (QID) | ORAL | Status: DC | PRN
  Administered 2021-09-18: 650 mg via ORAL
  Filled 2021-09-17: qty 2

## 2021-09-17 MED ORDER — SODIUM CHLORIDE 0.9 % IV SOLN: INTRAVENOUS | Status: DC

## 2021-09-17 MED ORDER — LOPERAMIDE HCL 2 MG PO CAPS
2.0000 mg | ORAL_CAPSULE | ORAL | Status: DC | PRN
Start: 2021-09-17 — End: 2021-09-18

## 2021-09-17 MED ORDER — LORAZEPAM 2 MG/ML IJ SOLN
1.0000 mg | Freq: Once | INTRAMUSCULAR | Status: AC
  Administered 2021-09-17: 1 mg via INTRAVENOUS
  Filled 2021-09-17: qty 1

## 2021-09-17 MED ORDER — ASPIRIN 81 MG PO TBEC
81.0000 mg | DELAYED_RELEASE_TABLET | ORAL | Status: DC
  Administered 2021-09-18: 81 mg via ORAL
  Filled 2021-09-17: qty 1

## 2021-09-17 MED ORDER — HYDRALAZINE HCL 20 MG/ML IJ SOLN: 5.0000 mg | INTRAMUSCULAR | Status: DC | PRN

## 2021-09-17 MED ORDER — ERYTHROMYCIN 5 MG/GM OP OINT: 1.0000 "application " | TOPICAL_OINTMENT | Freq: Two times a day (BID) | OPHTHALMIC | Status: DC | PRN

## 2021-09-17 MED ORDER — TRAZODONE HCL 50 MG PO TABS
25.0000 mg | ORAL_TABLET | Freq: Every evening | ORAL | Status: DC | PRN
  Administered 2021-09-18: 25 mg via ORAL
  Filled 2021-09-17: qty 1

## 2021-09-17 MED ORDER — ENOXAPARIN SODIUM 30 MG/0.3ML IJ SOSY
30.0000 mg | PREFILLED_SYRINGE | INTRAMUSCULAR | Status: DC
  Administered 2021-09-17: 30 mg via SUBCUTANEOUS
  Filled 2021-09-17: qty 0.3

## 2021-09-17 MED ORDER — DONEPEZIL HCL 5 MG PO TABS
5.0000 mg | ORAL_TABLET | Freq: Every day | ORAL | Status: DC
  Administered 2021-09-17: 5 mg via ORAL
  Filled 2021-09-17: qty 1

## 2021-09-17 NOTE — Assessment & Plan Note (Signed)
-   prediabetes and diet controlled

## 2021-09-17 NOTE — Assessment & Plan Note (Addendum)
-   This is chronic we will manage as needed -- she has not been having symptoms in hospital

## 2021-09-17 NOTE — Assessment & Plan Note (Signed)
-   Resume home levothyroxine supplement

## 2021-09-17 NOTE — Hospital Course (Signed)
88 year old female with moderate vascular dementia, intermittent confusion, hypertension, hypothyroidism, hyperlipidemia, osteoporosis, insufficiency was brought in by EMS to the emergency department due to increasing weakness, decreased appetite and poor oral intake.  Her daughter called EMS because she became concerned about progressive symptoms of weakness.  For the past 24 hours she has had very poor oral intake.  She is confused at baseline and did not have any specific complaints to report.  In the ED she was noted to have a rectal temperature of 101.  She had a leukocytosis with a WBC of 15.  Her urinalysis was unrevealing.  Her chest x-ray was unrevealing.  She had presented with mild tachycardia and elevated blood pressures.  She was diagnosed with SIRS and admission was requested for observation.

## 2021-09-17 NOTE — Assessment & Plan Note (Signed)
-   Source of infection unknown at this time however she does have an elevated procalcitonin and will treat with doxycycline 100 mg p.o. twice daily and provide supportive measures -Repeat chest x-ray no pneumonia seen -- no recurrence of fever -- WBC is normalized today  -- DC home, complete 3 days of doxycycline

## 2021-09-17 NOTE — Assessment & Plan Note (Signed)
-   Poor oral intake is concerning - Encourage assistance with feeding -- home health services ordered

## 2021-09-17 NOTE — H&P (Signed)
History and Physical  [facility]  Be[place] WHQ:[id number] DOB: 13-Apr-1928 DOA: 09/17/2021  PCP: Donita Brooks, MD  Patient coming from: Home lives with daughter and son -in- law Level of care: Med-Surg  I have personally briefly reviewed patient's old medical records in [facility]  Chief Complaint: weakness   HPI: Dawn Conner is a 88 year old female with moderate vascular dementia, intermittent confusion, hypertension, hypothyroidism, hyperlipidemia, osteoporosis, insufficiency was brought in by EMS to the emergency department due to increasing weakness, decreased appetite and poor oral intake.  Her daughter called EMS because she became concerned about progressive symptoms of weakness.  For the past 24 hours she has had very poor oral intake.  She is confused at baseline and did not have any specific complaints to report.  In the ED she was noted to have a rectal temperature of 101.  She had a leukocytosis with a WBC of 15.  Her urinalysis was unrevealing.  Her chest x-ray was unrevealing.  She had presented with mild tachycardia and elevated blood pressures.  She was diagnosed with SIRS and admission was requested for observation.  Review of Systems: Review of Systems  Unable to perform ROS: Dementia    Past Medical History:  Diagnosis Date   Dementia (HCC)    Diverticulosis    Hyperlipidemia    Hypertension    Osteoporosis    Oth fracture of shaft of right humerus, init for opn fx 1990's   Renal insufficiency    Thyroid disease     Past Surgical History:  Procedure Laterality Date   ABDOMINAL HYSTERECTOMY  1972   APPENDECTOMY     cataract surgery     Early 2000's both eyes    CHOLECYSTECTOMY  1980's   COLONOSCOPY  03/10/2006   [place]; severe diverticulosis especially involving the sigmoid and to a lesser extent all other segments as well.  There was no evidence of neoplastic pathology. Recommended continuing Metamucil as well as Questran which appeared  to keep her fairly regular. Consider repeat in 5 years.   OVARIAN CYST REMOVAL  09/2012   TONSILLECTOMY  age 51     reports that she has never smoked. She has never used smokeless tobacco. She reports that she does not drink alcohol and does not use drugs.  Allergies  Allergen Reactions   Sulfa Antibiotics     Family History  Problem Relation Age of Onset   Colon cancer Neg Hx     Prior to Admission medications   Medication Sig Start Date End Date Taking? Authorizing Provider  aspirin EC 81 MG tablet Take 81 mg by mouth every other day.   Yes [provider]  calcium carbonate (TUMS - DOSED IN MG ELEMENTAL CALCIUM) 500 MG chewable tablet Chew 1 tablet by mouth. 1-2 meals per day   Yes [provider]  Cholecalciferol (VITAMIN D3) 25 MCG (1000 UT) CAPS 1 Q AM, 2 Q PM Patient taking differently: Take 1,000-2,000 Units by mouth 2 (two) times daily. 1 tablet in morning and 2 tablets in evening 10/16/20  Yes Pickard, Priscille Heidelberg, MD  donepezil (ARICEPT) 5 MG tablet Take 1 tablet (5 mg total) by mouth at bedtime. 01/22/21  Yes Donita Brooks, MD  erythromycin ophthalmic ointment Place a 1/2 inch ribbon of ointment into the left lower eyelid BID prn. Patient taking differently: Place 1 application. into the left eye 2 (two) times daily as needed. Place a 1/2 inch ribbon of ointment into the  left lower eyelid twice daily as needed 07/28/21  Yes Particia NearingLane, Rachel Elizabeth, PA-C  levothyroxine (SYNTHROID) 75 MCG tablet TAKE 1 TABLET BY MOUTH BEFORE BREAKFAST 07/02/21  Yes Donita BrooksPickard, Warren T, MD  loperamide (IMODIUM) 2 MG capsule Take 2 mg by mouth as needed for diarrhea or loose stools.   Yes [provider]  Multiple Minerals-Vitamins (CALCIUM-MAGNESIUM-ZINC-D3 PO) Take 1-2 tablets by mouth daily.   Yes [provider]  simvastatin (ZOCOR) 20 MG tablet Take 1 tablet (20 mg total) by mouth at bedtime. 08/02/21  Yes Donita BrooksPickard, Warren T, MD    Physical Exam: Vitals:    09/17/21 0620 09/17/21 0700 09/17/21 0752 09/17/21 0828  BP: (!) 149/80 (!) 156/73  (!) 162/53  Pulse: 95 90  (!) 32  Resp: (!) 22 14  18   Temp:   99.5 F (37.5 C) 98 F (36.7 C)  TempSrc:   Rectal Oral  SpO2: 98% 98%  97%  Weight:      Height:        Constitutional: frail, elderly female, confused, NAD, calm, comfortable Eyes: PERRL, lids and conjunctivae normal ENMT: Mucous membranes are moist. Posterior pharynx clear of any exudate or lesions.Normal dentition.  Neck: normal, supple, no masses, no thyromegaly Respiratory: clear to auscultation bilaterally, no wheezing, no crackles. Normal respiratory effort. No accessory muscle use.  Cardiovascular: normal s1, s2 sounds, no murmurs / rubs / gallops. No extremity edema. 2+ pedal pulses. No carotid bruits.  Abdomen: no tenderness, no masses palpated. No hepatosplenomegaly. Bowel sounds positive.  Musculoskeletal: no clubbing / cyanosis. No joint deformity upper and lower extremities. Good ROM, no contractures. Normal muscle tone.  Skin: no rashes, lesions, ulcers. No induration Neurologic: CN 2-12 grossly intact. Sensation intact, DTR normal. Strength 5/5 in all 4.  Psychiatric: Poor judgment and insight. Alert and oriented x 3. Normal mood.   Labs on Admission: I have personally reviewed following labs and imaging studies  CBC: Recent Labs  Lab 09/17/21 0505  WBC 15.1*  NEUTROABS 13.6*  HGB 13.2  HCT 41.8  MCV 87.4  PLT 175   Basic Metabolic Panel: Recent Labs  Lab 09/17/21 0505  NA 137  K 3.8  CL 104  CO2 25  GLUCOSE 177*  BUN 22  CREATININE 1.14*  CALCIUM 9.4   GFR: Estimated Creatinine Clearance: 24.9 mL/min (A) (by C-G formula based on SCr of 1.14 mg/dL (H)). Liver Function Tests: Recent Labs  Lab 09/17/21 0505  AST 30  ALT 18  ALKPHOS 58  BILITOT 0.9  PROT 7.8  ALBUMIN 3.9   No results for input(s): LIPASE, AMYLASE in the last 168 hours. No results for input(s): AMMONIA in the last 168  hours. Coagulation Profile: Recent Labs  Lab 09/17/21 0505  INR 1.0   Cardiac Enzymes: No results for input(s): CKTOTAL, CKMB, CKMBINDEX, TROPONINI in the last 168 hours. BNP (last 3 results) No results for input(s): PROBNP in the last 8760 hours. HbA1C: No results for input(s): HGBA1C in the last 72 hours. CBG: Recent Labs  Lab 09/17/21 0441  GLUCAP 190*   Lipid Profile: No results for input(s): CHOL, HDL, LDLCALC, TRIG, CHOLHDL, LDLDIRECT in the last 72 hours. Thyroid Function Tests: No results for input(s): TSH, T4TOTAL, FREET4, T3FREE, THYROIDAB in the last 72 hours. Anemia Panel: No results for input(s): VITAMINB12, FOLATE, FERRITIN, TIBC, IRON, RETICCTPCT in the last 72 hours. Urine analysis:    Component Value Date/Time   COLORURINE YELLOW 09/17/2021 0452   APPEARANCEUR CLEAR 09/17/2021 0452  LABSPEC 1.011 09/17/2021 0452   PHURINE 7.0 09/17/2021 0452   GLUCOSEU NEGATIVE 09/17/2021 0452   HGBUR MODERATE (A) 09/17/2021 0452   BILIRUBINUR NEGATIVE 09/17/2021 0452   KETONESUR 5 (A) 09/17/2021 0452   PROTEINUR 30 (A) 09/17/2021 0452   NITRITE NEGATIVE 09/17/2021 0452   LEUKOCYTESUR NEGATIVE 09/17/2021 0452    Radiological Exams on Admission: CT HEAD WO CONTRAST ( )  Result Date: 09/17/2021 CLINICAL DATA:  88 year old female with history of altered mental status. Weakness. EXAM: CT HEAD WITHOUT CONTRAST TECHNIQUE: Contiguous axial images were obtained from the base of the skull through the vertex without intravenous contrast. RADIATION DOSE REDUCTION: This exam was performed according to the departmental dose-optimization program which includes automated exposure control, adjustment of the mA and/or kV according to patient size and/or use of iterative reconstruction technique. COMPARISON:  Head CT 01/05/2020. FINDINGS: Brain: Moderate cerebral and mild cerebellar atrophy. Patchy and confluent areas of decreased attenuation are noted throughout the deep and  periventricular white matter of the cerebral hemispheres bilaterally, compatible with chronic microvascular ischemic disease. No evidence of acute infarction, hemorrhage, hydrocephalus, extra-axial collection or mass lesion/mass effect. Vascular: Multiple atherosclerotic calcifications are noted within the cerebral vasculature. Skull: Normal. Negative for fracture or focal lesion. Sinuses/Orbits: No acute finding. Other: None. IMPRESSION: 1. No acute intracranial abnormalities. 2. Moderate cerebral and mild cerebellar atrophy with extensive chronic microvascular ischemic changes in the cerebral white matter, as above. Electronically Signed   By: Trudie Reed M.D.   On: 09/17/2021 06:45   DG Chest Port 1 View  Result Date: 09/17/2021 CLINICAL DATA:  88 year old female with possible sepsis.  Weakness. EXAM: PORTABLE CHEST 1 VIEW COMPARISON:  No priors. FINDINGS: Lung volumes are normal. No consolidative airspace disease. No pleural effusions. No pneumothorax. No pulmonary nodule or mass noted. Pulmonary vasculature and the cardiomediastinal silhouette are within normal limits. Atherosclerosis in the thoracic aorta. Surgical clips project over the epigastric region near the expected location of the gastroesophageal junction. IMPRESSION: 1.  No radiographic evidence of acute cardiopulmonary disease. 2. Aortic atherosclerosis. Electronically Signed   By: Trudie Reed M.D.   On: 09/17/2021 05:10    EKG: Independently reviewed.   Assessment/Plan Principal Problem:   SIRS (systemic inflammatory response syndrome) (HCC) Active Problems:   Hypertension   Hypothyroidism   Hyperlipidemia   Vascular dementia (HCC)   Alternating constipation and diarrhea   Failure to thrive in adult   Fever   Leukocytosis   Generalized weakness   Chronic confusional state   Hyperglycemia    Assessment and Plan: * SIRS (systemic inflammatory response syndrome) (HCC) - Treating supportively while we are working  this up further  Hyperglycemia - We could not find a prior history of diabetes mellitus however she has had some significantly elevated blood glucose readings possibly due to stress however we will check a hemoglobin A1c and provide sensitive SSI coverage and monitor CBG closely.  Chronic confusional state - This is her baseline according to family and records - Delirium precautions ordered.  Generalized weakness - Multifactorial given advanced age and comorbidities - PT eval requested  Leukocytosis - Cause unknown at this time, treating supportively as noted above and repeat CBC with differential in AM -Repeat procalcitonin in a.m.  Fever - Source of infection unknown at this time however she does have an elevated procalcitonin and will treat with doxycycline 100 mg p.o. twice daily and provide supportive measures -Repeat chest x-ray in a.m. after hydration to see if and pneumonia fluffs out  Failure to thrive in adult - Poor oral intake is concerning - Encourage assistance with feeding  Alternating constipation and diarrhea - This is chronic we will manage as needed  Vascular dementia (HCC) - Resume home donezepil 5 mg every evening -Delirium precautions ordered - Resume home simvastatin, aspirin  Hyperlipidemia - Resume home simvastatin every evening  Hypothyroidism - Resume home levothyroxine supplement  Hypertension -Currently on no blood pressure medications from home -Add amlodipine 5 mg daily  DVT prophylaxis: enoxaparin   Code Status: full   Family Communication: daughter at bedside   Disposition Plan: anticipate home in 1-2 days   Consults called:   Admission status: OBS  Level of care: Med-Surg Standley Dakins MD [facility] How to contact the [facility] Attending or Consulting provider 7A - 7P or covering provider during after hours 7P -7A, for this patient?  Check the care team in [facility] and look for a) attending/consulting [facility] provider listed and b)  the [facility] team listed Log into [email or web address] and use [place]'s universal password to access. If you do not have the password, please contact the hospital operator. Locate the [facility] provider you are looking for under [facility] and page to a number that you can be directly reached. If you still have difficulty reaching the provider, please page the [facility] (Director on Call) for the Hospitalists listed on amion for assistance.  If 7PM-7AM, please contact night-coverage [email or web address] Password [email or web address]  09/17/2021, 10:08 AM

## 2021-09-17 NOTE — Assessment & Plan Note (Signed)
-   Resume home simvastatin every evening

## 2021-09-17 NOTE — Assessment & Plan Note (Signed)
-   Treating supportively while we are working this up further

## 2021-09-17 NOTE — ED Notes (Signed)
Patient transported to CT 

## 2021-09-17 NOTE — Assessment & Plan Note (Signed)
-   Resume home donezepil 5 mg every evening -Delirium precautions ordered - Resume home simvastatin, aspirin

## 2021-09-17 NOTE — ED Provider Notes (Signed)
[facility] Provider Note   CSN: [id number] Arrival date & time: 09/17/21  [id number]     History  Chief Complaint  Patient presents with   Weakness    Dawn Conner is a 88 y.o. female.  Patient sent to the emergency department from home by ambulance.  Family called EMS because the patient has been more confused than normal and has not been eating or drinking for approximately 1 day.  At arrival, patient is confused but does not have any specific complaints.      Home Medications Prior to Admission medications   Medication Sig Start Date End Date Taking? Authorizing Provider  aspirin EC 81 MG tablet Take 81 mg by mouth every other day.    [provider]  calcium carbonate (TUMS - DOSED IN MG ELEMENTAL CALCIUM) 500 MG chewable tablet Chew 1 tablet by mouth. 1-2 meals per day    [provider]  Cholecalciferol (VITAMIN D3) 25 MCG (1000 UT) CAPS 1 Q AM, 2 Q PM 10/16/20   Susy Frizzle, MD  donepezil (ARICEPT) 5 MG tablet Take 1 tablet (5 mg total) by mouth at bedtime. 01/22/21   Susy Frizzle, MD  erythromycin ophthalmic ointment Place a 1/2 inch ribbon of ointment into the left lower eyelid BID prn. 07/28/21   Volney American, PA-C  levothyroxine (SYNTHROID) 75 MCG tablet TAKE 1 TABLET BY MOUTH BEFORE BREAKFAST 07/02/21   Susy Frizzle, MD  loperamide (IMODIUM) 2 MG capsule Take by mouth as needed for diarrhea or loose stools.    [provider]  Multiple Minerals-Vitamins (CALCIUM-MAGNESIUM-ZINC-D3 PO) Take 1-2 tablets by mouth daily.    [provider]  simvastatin (ZOCOR) 20 MG tablet Take 1 tablet (20 mg total) by mouth at bedtime. 08/02/21   Susy Frizzle, MD  Wheat Dextrin (BENEFIBER PO) Take by mouth. Benefiber Gummy- 2 daily, max of 4 per day.    [provider]      Allergies    Sulfa antibiotics    Review of Systems   Review of Systems  Physical Exam Updated Vital Signs BP (!) 156/73    Pulse 90   Temp (!) 101.8 F (38.8 C) (Rectal)   Resp 14   Ht 5' (1.524 m)   Wt 56.7 kg   SpO2 98%   BMI 24.41 kg/m  Physical Exam Vitals and nursing note reviewed.  Constitutional:      General: She is not in acute distress.    Appearance: She is well-developed.  HENT:     Head: Normocephalic and atraumatic.     Mouth/Throat:     Mouth: Mucous membranes are moist.  Eyes:     General: Vision grossly intact. Gaze aligned appropriately.     Extraocular Movements: Extraocular movements intact.     Conjunctiva/sclera: Conjunctivae normal.  Cardiovascular:     Rate and Rhythm: Regular rhythm. Tachycardia present.     Pulses: Normal pulses.     Heart sounds: Normal heart sounds, S1 normal and S2 normal. No murmur heard.   No friction rub. No gallop.  Pulmonary:     Effort: Pulmonary effort is normal. No respiratory distress.     Breath sounds: Normal breath sounds.  Abdominal:     General: Bowel sounds are normal.     Palpations: Abdomen is soft.     Tenderness: There is no abdominal tenderness. There is no guarding or rebound.     Hernia: No hernia is present.  Musculoskeletal:  General: No swelling.     Cervical back: Full passive range of motion without pain, normal range of motion and neck supple. No spinous process tenderness or muscular tenderness. Normal range of motion.     Right lower leg: No edema.     Left lower leg: No edema.  Skin:    General: Skin is warm and dry.     Capillary Refill: Capillary refill takes less than 2 seconds.     Findings: No ecchymosis, erythema, rash or wound.  Neurological:     General: No focal deficit present.     Mental Status: She is alert. She is disoriented.     GCS: GCS eye subscore is 4. GCS verbal subscore is 5. GCS motor subscore is 6.     Cranial Nerves: Cranial nerves 2-12 are intact.     Sensory: Sensation is intact.     Motor: Motor function is intact.     Coordination: Coordination is intact.    ED Results /  Procedures / Treatments   Labs (all labs ordered are listed, but only abnormal results are displayed) Labs Reviewed  COMPREHENSIVE METABOLIC PANEL - Abnormal; Notable for the following components:      Result Value   Glucose, Bld 177 (*)    Creatinine, Ser 1.14 (*)    GFR, Estimated 45 (*)    All other components within normal limits  CBC WITH DIFFERENTIAL/PLATELET - Abnormal; Notable for the following components:   WBC 15.1 (*)    Neutro Abs 13.6 (*)    Lymphs Abs 0.5 (*)    All other components within normal limits  URINALYSIS, ROUTINE W REFLEX MICROSCOPIC - Abnormal; Notable for the following components:   Hgb urine dipstick MODERATE (*)    Ketones, ur 5 (*)    Protein, ur 30 (*)    All other components within normal limits  CBG MONITORING, ED - Abnormal; Notable for the following components:   Glucose-Capillary 190 (*)    All other components within normal limits  SARS CORONAVIRUS 2 BY RT PCR  CULTURE, BLOOD (ROUTINE X 2)  CULTURE, BLOOD (ROUTINE X 2)  URINE CULTURE  LACTIC ACID, PLASMA  PROTIME-INR  APTT  LACTIC ACID, PLASMA    EKG EKG Interpretation  Date/Time:  Monday Sep 17 2021 04:37:35 EDT Ventricular Rate:  103 PR Interval:  233 QRS Duration: 86 QT Interval:  332 QTC Calculation: 435 R Axis:   -53 Text Interpretation: Sinus tachycardia Prolonged PR interval Probable left atrial enlargement Inferior infarct, old Consider anterior infarct Confirmed by Orpah Greek [phone]) on 09/17/2021 7:03:36 AM  Radiology CT HEAD WO CONTRAST (5MM)  Result Date: 09/17/2021 CLINICAL DATA:  88 year old female with history of altered mental status. Weakness. EXAM: CT HEAD WITHOUT CONTRAST TECHNIQUE: Contiguous axial images were obtained from the base of the skull through the vertex without intravenous contrast. RADIATION DOSE REDUCTION: This exam was performed according to the departmental dose-optimization program which includes automated exposure control, adjustment  of the mA and/or kV according to patient size and/or use of iterative reconstruction technique. COMPARISON:  Head CT 01/05/2020. FINDINGS: Brain: Moderate cerebral and mild cerebellar atrophy. Patchy and confluent areas of decreased attenuation are noted throughout the deep and periventricular white matter of the cerebral hemispheres bilaterally, compatible with chronic microvascular ischemic disease. No evidence of acute infarction, hemorrhage, hydrocephalus, extra-axial collection or mass lesion/mass effect. Vascular: Multiple atherosclerotic calcifications are noted within the cerebral vasculature. Skull: Normal. Negative for fracture or focal lesion. Sinuses/Orbits: No acute  finding. Other: None. IMPRESSION: 1. No acute intracranial abnormalities. 2. Moderate cerebral and mild cerebellar atrophy with extensive chronic microvascular ischemic changes in the cerebral white matter, as above. Electronically Signed   By: Vinnie Langton M.D.   On: 09/17/2021 06:45   DG Chest Port 1 View  Result Date: 09/17/2021 CLINICAL DATA:  88 year old female with possible sepsis.  Weakness. EXAM: PORTABLE CHEST 1 VIEW COMPARISON:  No priors. FINDINGS: Lung volumes are normal. No consolidative airspace disease. No pleural effusions. No pneumothorax. No pulmonary nodule or mass noted. Pulmonary vasculature and the cardiomediastinal silhouette are within normal limits. Atherosclerosis in the thoracic aorta. Surgical clips project over the epigastric region near the expected location of the gastroesophageal junction. IMPRESSION: 1.  No radiographic evidence of acute cardiopulmonary disease. 2. Aortic atherosclerosis. Electronically Signed   By: Vinnie Langton M.D.   On: 09/17/2021 05:10    Procedures Procedures    Medications Ordered in ED Medications  acetaminophen (TYLENOL) tablet 650 mg (has no administration in time range)    ED Course/ Medical Decision Making/ A&P                           Medical Decision  Making Problems Addressed: SIRS (systemic inflammatory response syndrome) ([place]): acute illness or injury with systemic symptoms that poses a threat to life or bodily functions  Amount and/or Complexity of Data Reviewed Independent Historian: caregiver Labs: ordered. Decision-making details documented in ED Course. Radiology: ordered and independent interpretation performed. Decision-making details documented in ED Course. ECG/medicine tests: ordered and independent interpretation performed. Decision-making details documented in ED Course.  Risk OTC drugs. Decision regarding hospitalization.   Patient brought to the emergency department from home.  Patient with altered mental status at home.  Additional information provided by daughter.  Patient is normally fairly independent, was unable to get up and ambulate on her own.  She is more confused than usual and has been doing poorly for 1 day, not eating or drinking.  Differential diagnosis includes stroke, TIA, sepsis.  Patient was found to be febrile at arrival, temperature 101.8.  She does have an elevated white blood cell count, was mildly tachycardic and tachypneic at arrival.  Multiple sirs criteria are present.  No obvious source of infection has been found.  Urinalysis without signs of infection.  Chest x-ray unremarkable.  She does not have any other obvious findings on exam to explain the fever.        Final Clinical Impression(s) / ED Diagnoses Final diagnoses:  SIRS (systemic inflammatory response syndrome) ([place])    Rx / DC Orders ED Discharge Orders     None         Poetry Cerro, Gwenyth Allegra, MD 09/17/21 [phone]

## 2021-09-17 NOTE — Assessment & Plan Note (Addendum)
-   This is her baseline according to family and records - Delirium precautions ordered.

## 2021-09-17 NOTE — Assessment & Plan Note (Addendum)
-   Multifactorial given advanced age and comorbidities - PT eval recommending SNF. Daughter wants to take home with home health services.

## 2021-09-17 NOTE — Assessment & Plan Note (Signed)
-   WBC has normalized today.

## 2021-09-17 NOTE — ED Triage Notes (Signed)
Pt arrived via [facility] c/o weakness. Pts daughter called EMS due to Pt increased weakness, decreased appetite and drinking less than usual. Pts baseline is confused per EMS. EMS report Pt has previous TIA. CBG on scene 206.EMS administered apprx 300cc NS PTA.

## 2021-09-17 NOTE — Assessment & Plan Note (Signed)
-  Currently on no blood pressure medications from home -Add amlodipine 5 mg daily

## 2021-09-18 ENCOUNTER — Observation Stay (HOSPITAL_COMMUNITY): Payer: Medicare Other

## 2021-09-18 DIAGNOSIS — R198 Other specified symptoms and signs involving the digestive system and abdomen: Secondary | ICD-10-CM | POA: Diagnosis not present

## 2021-09-18 DIAGNOSIS — I10 Essential (primary) hypertension: Secondary | ICD-10-CM

## 2021-09-18 DIAGNOSIS — R7303 Prediabetes: Secondary | ICD-10-CM | POA: Diagnosis present

## 2021-09-18 DIAGNOSIS — F4489 Other dissociative and conversion disorders: Secondary | ICD-10-CM | POA: Diagnosis not present

## 2021-09-18 DIAGNOSIS — R6510 Systemic inflammatory response syndrome (SIRS) of non-infectious origin without acute organ dysfunction: Secondary | ICD-10-CM | POA: Diagnosis not present

## 2021-09-18 DIAGNOSIS — R627 Adult failure to thrive: Secondary | ICD-10-CM | POA: Diagnosis not present

## 2021-09-18 LAB — CBC WITH DIFFERENTIAL/PLATELET
Abs Immature Granulocytes: 0.02 10*3/uL (ref 0.00–0.07)
Basophils Absolute: 0 10*3/uL (ref 0.0–0.1)
Basophils Relative: 1 %
Eosinophils Absolute: 0 10*3/uL (ref 0.0–0.5)
Eosinophils Relative: 1 %
HCT: 41.4 % (ref 36.0–46.0)
Hemoglobin: 13 g/dL (ref 12.0–15.0)
Immature Granulocytes: 0 %
Lymphocytes Relative: 15 %
Lymphs Abs: 1.1 10*3/uL (ref 0.7–4.0)
MCH: 27.5 pg (ref 26.0–34.0)
MCHC: 31.4 g/dL (ref 30.0–36.0)
MCV: 87.5 fL (ref 80.0–100.0)
Monocytes Absolute: 0.8 10*3/uL (ref 0.1–1.0)
Monocytes Relative: 11 %
Neutro Abs: 5.7 10*3/uL (ref 1.7–7.7)
Neutrophils Relative %: 72 %
Platelets: 160 10*3/uL (ref 150–400)
RBC: 4.73 MIL/uL (ref 3.87–5.11)
RDW: 14.9 % (ref 11.5–15.5)
WBC: 7.7 10*3/uL (ref 4.0–10.5)
nRBC: 0 % (ref 0.0–0.2)

## 2021-09-18 LAB — GLUCOSE, CAPILLARY
Glucose-Capillary: 111 mg/dL — ABNORMAL HIGH (ref 70–99)
Glucose-Capillary: 120 mg/dL — ABNORMAL HIGH (ref 70–99)

## 2021-09-18 LAB — BASIC METABOLIC PANEL
Anion gap: 7 (ref 5–15)
BUN: 20 mg/dL (ref 8–23)
CO2: 24 mmol/L (ref 22–32)
Calcium: 9.3 mg/dL (ref 8.9–10.3)
Chloride: 109 mmol/L (ref 98–111)
Creatinine, Ser: 1 mg/dL (ref 0.44–1.00)
GFR, Estimated: 53 mL/min — ABNORMAL LOW (ref >60)
Glucose, Bld: 101 mg/dL — ABNORMAL HIGH (ref 70–99)
Potassium: 3.5 mmol/L (ref 3.5–5.1)
Sodium: 140 mmol/L (ref 135–145)

## 2021-09-18 LAB — PROCALCITONIN: Procalcitonin: 0.9 ng/mL

## 2021-09-18 LAB — MAGNESIUM: Magnesium: 2 mg/dL (ref 1.7–2.4)

## 2021-09-18 MED ORDER — ERYTHROMYCIN 5 MG/GM OP OINT: 1.0000 "application " | TOPICAL_OINTMENT | Freq: Two times a day (BID) | OPHTHALMIC | Status: DC | PRN

## 2021-09-18 MED ORDER — DOXYCYCLINE HYCLATE 100 MG PO TABS: 100.0000 mg | ORAL_TABLET | Freq: Two times a day (BID) | ORAL | 0 refills | Status: AC

## 2021-09-18 MED ORDER — AMLODIPINE BESYLATE 5 MG PO TABS: 5.0000 mg | ORAL_TABLET | Freq: Every day | ORAL | 1 refills | Status: DC

## 2021-09-18 MED ORDER — ENSURE ENLIVE PO LIQD: 237.0000 mL | Freq: Three times a day (TID) | ORAL | 1 refills | Status: AC

## 2021-09-18 MED ORDER — VITAMIN D3 25 MCG (1000 UT) PO CAPS: 1000.0000 [IU] | ORAL_CAPSULE | Freq: Two times a day (BID) | ORAL | Status: DC

## 2021-09-18 NOTE — Assessment & Plan Note (Signed)
--   as evidenced by A1c of 6.2% -- diet controlled

## 2021-09-18 NOTE — Evaluation (Addendum)
Physical Therapy Evaluation Patient Details Name: Dawn Conner MRN: [id number] DOB: 06/26/1927 Today's Date: 09/18/2021  History of Present Illness  Margaretha Mahan is a 88 year old female with moderate vascular dementia, intermittent confusion, hypertension, hypothyroidism, hyperlipidemia, osteoporosis, insufficiency was brought in by EMS to the emergency department due to increasing weakness, decreased appetite and poor oral intake.  Her daughter called EMS because she became concerned about progressive symptoms of weakness.  For the past 24 hours she has had very poor oral intake.  She is confused at baseline and did not have any specific complaints to report.  In the ED she was noted to have a rectal temperature of 101.  She had a leukocytosis with a WBC of 15.  Her urinalysis was unrevealing.  Her chest x-ray was unrevealing.  She had presented with mild tachycardia and elevated blood pressures.  She was diagnosed with SIRS and admission was requested for observation.   Clinical Impression  Patient is able to perform supine to sit bed mobility with independence demonstrating appropriate strength and stability to do so. Patient is modified independent with sit to stand transfers with needing increased time to perform the task with minor trunk control deficits noted causing slight unsteadiness during transfer. Patient was able to ambulate for 40 feet with modified independence due to decreased gait speed and need of PT supervision during assessment. Patient presents with moderate gait deficits that impact overall balance causing patient to be unsteady during ambulation. Patient demonstrates a scissoring like gait pattern along with decreased ankle DF B impacting step/stride length and increasing stance time B. Patient does tend to drift to the right and lose balance intermittently during gait. Patient to be discharged home today and discharged from physical therapy to care of nursing for ambulation daily as  tolerated for length of stay.      Recommendations for follow up therapy are one component of a multi-disciplinary discharge planning process, led by the attending physician.  Recommendations may be updated based on patient status, additional functional criteria and insurance authorization.  Follow Up Recommendations Skilled nursing-short term rehab (<3 hours/day)    Assistance Recommended at Discharge Set up Supervision/Assistance  Patient can return home with the following  A little help with walking and/or transfers;Help with stairs or ramp for entrance;A little help with bathing/dressing/bathroom;Assist for transportation    Equipment Recommendations Rolling walker (2 wheels)  Recommendations for Other Services       Functional Status Assessment Patient has had a recent decline in their functional status and demonstrates the ability to make significant improvements in function in a reasonable and predictable amount of time.     Precautions / Restrictions Precautions Precautions: Fall Restrictions Weight Bearing Restrictions: No      Mobility  Bed Mobility Overal bed mobility: Independent             General bed mobility comments: Patient was able to perform supine to sit bed mobility with independence with appropriate strength and coordination needed.    Transfers Overall transfer level: Modified independent                 General transfer comment: Patient able to perform sit to stand transfer independently with no AD but needs increased time to perform task. Slight balance deficits observed with upward motions of task.    Ambulation/Gait Ambulation/Gait assistance: Modified independent (Device/Increase time), Supervision Gait Distance (Feet): 40 Feet Assistive device: None Gait Pattern/deviations: Step-to pattern, Decreased stride length, Decreased step length - right, Decreased  step length - left, Scissoring, Decreased weight shift to right, Decreased  dorsiflexion - right, Decreased dorsiflexion - left, Narrow base of support Gait velocity: decreased     General Gait Details: Patient was able to ambulate for 40 feet with modified independence and supervision provided by PT with no AD. Patient does demonstrate moderate gait deficits that impact overall balance and cause patient to be unsteady. Patient demonstrates a scissoring gait pattern along with decreased ankle DF B impacting step/stride length as well as increasing stance time B. Patient does tend to drift to the right and loses balance intermittently.  Stairs            Wheelchair Mobility    Modified Rankin (Stroke Patients Only)       Balance Overall balance assessment: Needs assistance Sitting-balance support: Bilateral upper extremity supported, Feet supported Sitting balance-Leahy Scale: Good Sitting balance - Comments: Patient able to sit EOB with support   Standing balance support: No upper extremity supported, During functional activity Standing balance-Leahy Scale: Fair Standing balance comment: Patient able to stand without AD, but does have minor swaying demonstrating unsteadiness.                             Pertinent Vitals/Pain Pain Assessment Pain Assessment: No/denies pain    Home Living Family/patient expects to be discharged to:: Private residence Living Arrangements: Children Available Help at Discharge: Available 24 hours/day Type of Home: House         Home Layout: Two level Home Equipment: None      Prior Function Prior Level of Function : Independent/Modified Independent             Mobility Comments: Patient reports being able to ambulate at home independently with no AD. 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     Hand Dominance         Extremity/Trunk Assessment   Upper Extremity Assessment Upper Extremity Assessment: Generalized weakness    Lower Extremity Assessment Lower Extremity Assessment: Generalized weakness    Cervical / Trunk Assessment Cervical / Trunk Assessment: Normal  Communication   Communication: Receptive difficulties;Other (comment) (possibly due to confusion)  Cognition Arousal/Alertness: Awake/alert Behavior During Therapy: WFL for tasks assessed/performed Overall Cognitive Status: History of cognitive impairments - at baseline                                          General Comments      Exercises     Assessment/Plan    PT Assessment Patient needs continued PT services;All further PT needs can be met in the next venue of care  PT Problem List Decreased strength;Decreased range of motion;Decreased activity tolerance;Decreased balance;Decreased mobility;Decreased coordination;Decreased cognition       PT Treatment Interventions DME instruction;Gait training;Functional mobility training;Therapeutic activities;Therapeutic exercise;Balance training;Neuromuscular re-education    PT Goals (Current goals can be found in the Care Plan section)  Acute Rehab PT Goals Patient Stated Goal: return home PT Goal Formulation: With patient Time For Goal Achievement: 10/02/21 Potential to Achieve Goals: Good    Frequency Min 3X/week     Co-evaluation               AM-PAC  PT "6 Clicks" Mobility  Outcome Measure Help needed turning from your back to your side while in a flat bed without using bedrails?: None Help needed moving from lying on your back to sitting on the side of a flat bed without using bedrails?: None Help needed moving to and from a bed to a chair (including a wheelchair)?: A Little Help needed standing up from a chair using your arms (e.g., wheelchair or bedside chair)?: A Little Help needed to walk in hospital room?: A Little Help needed climbing 3-5  steps with a railing? : A Little 6 Click Score: 20    End of Session Equipment Utilized During Treatment: Gait belt Activity Tolerance: Patient tolerated treatment well;Patient limited by fatigue;No increased pain Patient left: in chair;with call bell/phone within reach;with nursing/sitter in room Nurse Communication: Mobility status PT Visit Diagnosis: Unsteadiness on feet (R26.81);Other abnormalities of gait and mobility (R26.89);Muscle weakness (generalized) (M62.81)    Time: 1039-1100 PT Time Calculation (min) (ACUTE ONLY): 21 min   Charges:   PT Evaluation $PT Eval Moderate Complexity: 1 Mod PT Treatments $Therapeutic Activity: 8-22 mins        2:00 PM, 09/18/21 Lestine Box, S/PT   During this treatment session, the therapist was present, participating in and directing the treatment.  2:30 PM, 09/18/21 Lonell Grandchild, MPT Physical Therapist with [facility] [phone] office [phone] mobile phone

## 2021-09-18 NOTE — Progress Notes (Signed)
Patient discharged home with daughter IV removed from Left AC , and Right AC, went over discharge instructions with patients daughter, verbalized understanding, patient transferred by w/c to daughters personal vehicle

## 2021-09-18 NOTE — Care Management CC44 (Signed)
Condition Code 44 Documentation Completed  Patient Details  Name: Dawn Conner MRN: [id number] Date of Birth: 1928/03/16   Condition Code 44 given:    Patient signature on Condition Code 44 notice:    Documentation of 2 MD's agreement:    Code 44 added to claim:       Corey Harold 09/18/2021, 12:38 PM

## 2021-09-18 NOTE — Discharge Instructions (Signed)
IMPORTANT INFORMATION: PAY CLOSE ATTENTION   PHYSICIAN DISCHARGE INSTRUCTIONS  Follow with Primary care provider  Pickard, Warren T, MD  and other consultants as instructed by your Hospitalist Physician  SEEK MEDICAL CARE OR RETURN TO EMERGENCY ROOM IF SYMPTOMS COME BACK, WORSEN OR NEW PROBLEM DEVELOPS   Please note: You were cared for by a hospitalist during your hospital stay. Every effort will be made to forward records to your primary care provider.  You can request that your primary care provider send for your hospital records if they have not received them.  Once you are discharged, your primary care physician will handle any further medical issues. Please note that NO REFILLS for any discharge medications will be authorized once you are discharged, as it is imperative that you return to your primary care physician (or establish a relationship with a primary care physician if you do not have one) for your post hospital discharge needs so that they can reassess your need for medications and monitor your lab values.  Please get a complete blood count and chemistry panel checked by your Primary MD at your next visit, and again as instructed by your Primary MD.  Get Medicines reviewed and adjusted: Please take all your medications with you for your next visit with your Primary MD  Laboratory/radiological data: Please request your Primary MD to go over all hospital tests and procedure/radiological results at the follow up, please ask your primary care provider to get all Hospital records sent to his/her office.  In some cases, they will be blood work, cultures and biopsy results pending at the time of your discharge. Please request that your primary care provider follow up on these results.  If you are diabetic, please bring your blood sugar readings with you to your follow up appointment with primary care.    Please call and make your follow up appointments as soon as possible.    Also  Note the following: If you experience worsening of your admission symptoms, develop shortness of breath, life threatening emergency, suicidal or homicidal thoughts you must seek medical attention immediately by calling 911 or calling your MD immediately  if symptoms less severe.  You must read complete instructions/literature along with all the possible adverse reactions/side effects for all the Medicines you take and that have been prescribed to you. Take any new Medicines after you have completely understood and accpet all the possible adverse reactions/side effects.   Do not drive when taking Pain medications or sleeping medications (Benzodiazepines)  Do not take more than prescribed Pain, Sleep and Anxiety Medications. It is not advisable to combine anxiety,sleep and pain medications without talking with your primary care practitioner  Special Instructions: If you have smoked or chewed Tobacco  in the last 2 yrs please stop smoking, stop any regular Alcohol  and or any Recreational drug use.  Wear Seat belts while driving.  Do not drive if taking any narcotic, mind altering or controlled substances or recreational drugs or alcohol.       

## 2021-09-18 NOTE — Care Management Obs Status (Signed)
MEDICARE OBSERVATION STATUS NOTIFICATION   Patient Details  Name: Dawn Conner MRN: [id number] Date of Birth: 08-07-1927   Medicare Observation Status Notification Given:  Yes    Corey Harold 09/18/2021, 12:38 PM

## 2021-09-18 NOTE — Discharge Summary (Signed)
Physician Discharge Summary  Dawn Conner [id number] DOB: [birth year] DOA: 09/17/2021  PCP: Susy Frizzle, MD  Admit date: 09/17/2021 Discharge date: 09/18/2021  Admitted From:  Home  Disposition: Home with [place]   Recommendations for Outpatient Follow-up:  Follow up with PCP in 1 weeks for recheck  Please obtain BMP/CBC in 1-2 weeks to follow up   Home Health: PT/OT, declined SW  Discharge Condition: Stable   CODE STATUS: Full  DIET: resume prior home diet    Brief Hospitalization Summary: Please see all hospital notes, images, labs for full details of the hospitalization. 88 year old female with moderate vascular dementia, intermittent confusion, hypertension, hypothyroidism, hyperlipidemia, osteoporosis, insufficiency was brought in by EMS to the emergency department due to increasing weakness, decreased appetite and poor oral intake.  Her daughter called EMS because she became concerned about progressive symptoms of weakness.  For the past 24 hours she has had very poor oral intake.  She is confused at baseline and did not have any specific complaints to report.  In the ED she was noted to have a rectal temperature of 101.  She had a leukocytosis with a WBC of 15.  Her urinalysis was unrevealing.  Her chest x-ray was unrevealing.  She had presented with mild tachycardia and elevated blood pressures.  She was diagnosed with SIRS and admission was requested for observation.  Hospital Course by Problem  Assessment and Plan: * SIRS (systemic inflammatory response syndrome) (HCC) - SIRS physiology resolved   Prediabetes -- as evidenced by A1c of 6.2% -- diet controlled   Hyperglycemia - prediabetes and diet controlled  Chronic confusional state - This is her baseline according to family and records - Delirium precautions ordered.  Generalized weakness - Multifactorial given advanced age and comorbidities - PT eval recommending SNF. Daughter wants to take home with home health  services.   Leukocytosis - WBC has normalized today.    Fever - Source of infection unknown at this time however she does have an elevated procalcitonin and will treat with doxycycline 100 mg p.o. twice daily and provide supportive measures -Repeat chest x-ray no pneumonia seen -- no recurrence of fever -- WBC is normalized today  -- DC home, complete 3 days of doxycycline  Failure to thrive in adult - Poor oral intake is concerning - Encourage assistance with feeding -- home health services ordered  Alternating constipation and diarrhea - This is chronic we will manage as needed -- she has not been having symptoms in hospital   Vascular dementia ([place]) - Resume home donezepil 5 mg every evening -Delirium precautions ordered - Resume home simvastatin, aspirin  Hyperlipidemia - Resume home simvastatin every evening  Hypothyroidism - Resume home levothyroxine supplement  Essential hypertension -Currently on no blood pressure medications from home -Add amlodipine 5 mg daily  Discharge Diagnoses:  Principal Problem:   SIRS (systemic inflammatory response syndrome) (HCC) Active Problems:   Hypertension   Hypothyroidism   Hyperlipidemia   Vascular dementia (HCC)   Alternating constipation and diarrhea   Failure to thrive in adult   Fever   Leukocytosis   Generalized weakness   Chronic confusional state   Hyperglycemia   Prediabetes   Discharge Instructions:  Allergies as of 09/18/2021       Reactions   Sulfa Antibiotics         Medication List     TAKE these medications    amLODipine 5 MG tablet Commonly known as: NORVASC Take 1 tablet (5 mg total)  by mouth daily. Start taking on: Sep 19, 2021   aspirin EC 81 MG tablet Take 81 mg by mouth every other day.   calcium carbonate 500 MG chewable tablet Commonly known as: TUMS - dosed in mg elemental calcium Chew 1 tablet by mouth. 1-2 meals per day   CALCIUM-MAGNESIUM-ZINC-D3 PO Take 1-2 tablets by  mouth daily.   donepezil 5 MG tablet Commonly known as: ARICEPT Take 1 tablet (5 mg total) by mouth at bedtime.   doxycycline 100 MG tablet Commonly known as: VIBRA-TABS Take 1 tablet (100 mg total) by mouth every 12 (twelve) hours for 3 days.   erythromycin ophthalmic ointment Place 1 application. into the left eye 2 (two) times daily as needed. Place a 1/2 inch ribbon of ointment into the left lower eyelid twice daily as needed   feeding supplement Liqd Take 237 mLs by mouth 3 (three) times daily between meals.   levothyroxine 75 MCG tablet Commonly known as: SYNTHROID TAKE 1 TABLET BY MOUTH BEFORE BREAKFAST   loperamide 2 MG capsule Commonly known as: IMODIUM Take 2 mg by mouth as needed for diarrhea or loose stools.   simvastatin 20 MG tablet Commonly known as: ZOCOR Take 1 tablet (20 mg total) by mouth at bedtime.   Vitamin D3 25 MCG (1000 UT) Caps Take 1-2 capsules (1,000-2,000 Units total) by mouth 2 (two) times daily. 1 tablet in morning and 2 tablets in evening        Follow-up Information     Susy Frizzle, MD. Schedule an appointment as soon as possible for a visit in 1 week(s).   Specialty: Family Medicine Why: Hospital Follow Up Contact information: [address] [place] [address] [phone]                Allergies  Allergen Reactions   Sulfa Antibiotics    Allergies as of 09/18/2021       Reactions   Sulfa Antibiotics         Medication List     TAKE these medications    amLODipine 5 MG tablet Commonly known as: NORVASC Take 1 tablet (5 mg total) by mouth daily. Start taking on: Sep 19, 2021   aspirin EC 81 MG tablet Take 81 mg by mouth every other day.   calcium carbonate 500 MG chewable tablet Commonly known as: TUMS - dosed in mg elemental calcium Chew 1 tablet by mouth. 1-2 meals per day   CALCIUM-MAGNESIUM-ZINC-D3 PO Take 1-2 tablets by mouth daily.   donepezil 5 MG tablet Commonly known as:  ARICEPT Take 1 tablet (5 mg total) by mouth at bedtime.   doxycycline 100 MG tablet Commonly known as: VIBRA-TABS Take 1 tablet (100 mg total) by mouth every 12 (twelve) hours for 3 days.   erythromycin ophthalmic ointment Place 1 application. into the left eye 2 (two) times daily as needed. Place a 1/2 inch ribbon of ointment into the left lower eyelid twice daily as needed   feeding supplement Liqd Take 237 mLs by mouth 3 (three) times daily between meals.   levothyroxine 75 MCG tablet Commonly known as: SYNTHROID TAKE 1 TABLET BY MOUTH BEFORE BREAKFAST   loperamide 2 MG capsule Commonly known as: IMODIUM Take 2 mg by mouth as needed for diarrhea or loose stools.   simvastatin 20 MG tablet Commonly known as: ZOCOR Take 1 tablet (20 mg total) by mouth at bedtime.   Vitamin D3 25 MCG (1000 UT) Caps Take 1-2 capsules (  1,000-2,000 Units total) by mouth 2 (two) times daily. 1 tablet in morning and 2 tablets in evening        Procedures/Studies: CT HEAD WO CONTRAST (5MM)  Result Date: 09/17/2021 CLINICAL DATA:  88 year old female with history of altered mental status. Weakness. EXAM: CT HEAD WITHOUT CONTRAST TECHNIQUE: Contiguous axial images were obtained from the base of the skull through the vertex without intravenous contrast. RADIATION DOSE REDUCTION: This exam was performed according to the departmental dose-optimization program which includes automated exposure control, adjustment of the mA and/or kV according to patient size and/or use of iterative reconstruction technique. COMPARISON:  Head CT 01/05/2020. FINDINGS: Brain: Moderate cerebral and mild cerebellar atrophy. Patchy and confluent areas of decreased attenuation are noted throughout the deep and periventricular white matter of the cerebral hemispheres bilaterally, compatible with chronic microvascular ischemic disease. No evidence of acute infarction, hemorrhage, hydrocephalus, extra-axial collection or mass lesion/mass  effect. Vascular: Multiple atherosclerotic calcifications are noted within the cerebral vasculature. Skull: Normal. Negative for fracture or focal lesion. Sinuses/Orbits: No acute finding. Other: None. IMPRESSION: 1. No acute intracranial abnormalities. 2. Moderate cerebral and mild cerebellar atrophy with extensive chronic microvascular ischemic changes in the cerebral white matter, as above. Electronically Signed   By: Vinnie Langton M.D.   On: 09/17/2021 06:45   DG CHEST PORT 1 VIEW  Result Date: 09/18/2021 CLINICAL DATA:  Weakness, SIRS EXAM: PORTABLE CHEST 1 VIEW COMPARISON:  09/17/2021 FINDINGS: No new consolidation or edema. No pleural effusion or pneumothorax. Stable cardiomediastinal contours. Surgical clips at the epigastric region. IMPRESSION: No acute process in the chest. Electronically Signed   By: Macy Mis M.D.   On: 09/18/2021 09:30   DG Chest Port 1 View  Result Date: 09/17/2021 CLINICAL DATA:  88 year old female with possible sepsis.  Weakness. EXAM: PORTABLE CHEST 1 VIEW COMPARISON:  No priors. FINDINGS: Lung volumes are normal. No consolidative airspace disease. No pleural effusions. No pneumothorax. No pulmonary nodule or mass noted. Pulmonary vasculature and the cardiomediastinal silhouette are within normal limits. Atherosclerosis in the thoracic aorta. Surgical clips project over the epigastric region near the expected location of the gastroesophageal junction. IMPRESSION: 1.  No radiographic evidence of acute cardiopulmonary disease. 2. Aortic atherosclerosis. Electronically Signed   By: Vinnie Langton M.D.   On: 09/17/2021 05:10     Subjective: Pt appears well, no complaints, pleasant confusion   Discharge Exam: Vitals:   09/17/21 2152 09/18/21 0515  BP: (!) 145/68 (!) 181/89  Pulse: 67 74  Resp: 18 16  Temp: 98 F (36.7 C) 98 F (36.7 C)  SpO2: 100% 97%   Vitals:   09/17/21 1715 09/17/21 2152 09/18/21 0500 09/18/21 0515  BP: 123/66 (!) 145/68  (!) 181/89   Pulse: 61 67  74  Resp: 16 18  16   Temp: 98 F (36.7 C) 98 F (36.7 C)  98 F (36.7 C)  TempSrc: Oral Oral    SpO2: 98% 100%  97%  Weight:   50.5 kg   Height:       General: Pt is alert, awake, not in acute distress Cardiovascular: normal S1/S2 +, no rubs, no gallops Respiratory: CTA bilaterally, no wheezing, no rhonchi Abdominal: Soft, NT, ND, bowel sounds + Extremities: no cyanosis   The results of significant diagnostics from this hospitalization (including imaging, microbiology, ancillary and laboratory) are listed below for reference.     Microbiology: Recent Results (from the past 240 hour(s))  SARS Coronavirus 2 by RT PCR (hospital order, performed in [facility] hospital lab) *cepheid single result test* Anterior Nasal Swab     Status: None   Collection Time: 09/17/21  4:52 AM   Specimen: Anterior Nasal Swab  Result Value Ref Range Status   SARS Coronavirus 2 by RT PCR NEGATIVE NEGATIVE Final    Comment: (NOTE) SARS-CoV-2 target nucleic acids are NOT DETECTED.  The SARS-CoV-2 RNA is generally detectable in upper and lower respiratory specimens during the acute phase of infection. The lowest concentration of SARS-CoV-2 viral copies this assay can detect is 250 copies / mL. A negative result does not preclude SARS-CoV-2 infection and should not be used as the sole basis for treatment or other patient management decisions.  A negative result may occur with improper specimen collection / handling, submission of specimen other than nasopharyngeal swab, presence of viral mutation(s) within the areas targeted by this assay, and inadequate number of viral copies (<250 copies / mL). A negative result must be combined with clinical observations, patient history, and epidemiological information.  Fact Sheet for Patients:   https://www.patel.info/  Fact Sheet for Healthcare Providers: https://hall.com/  This test is not yet  approved or  cleared by the [place] FDA and has been authorized for detection and/or diagnosis of SARS-CoV-2 by FDA under an Emergency Use Authorization (EUA).  This EUA will remain in effect (meaning this test can be used) for the duration of the COVID-19 declaration under Section 564(b)(1) of the Act, 21 U.S.C. section 360bbb-3(b)(1), unless the authorization is terminated or revoked sooner.  Performed at [facility], [address], [place], [place] [address]   Urine Culture     Status: Abnormal (Preliminary result)   Collection Time: 09/17/21  4:52 AM   Specimen: In/Out Cath Urine  Result Value Ref Range Status   Specimen Description   Final    IN/OUT CATH URINE Performed at [facility], [address] [place], [place] [address]    Special Requests   Final    NONE Performed at [facility], [address] [place], [place] [address]    Culture (A)  Final    >=100,000 COLONIES/mL ESCHERICHIA COLI SUSCEPTIBILITIES TO FOLLOW Performed at [place] [address] [place], [place] [address]    Report Status PENDING  Incomplete  Blood Culture (routine x 2)     Status: None (Preliminary result)   Collection Time: 09/17/21  5:05 AM   Specimen: BLOOD LEFT FOREARM  Result Value Ref Range Status   Specimen Description BLOOD LEFT FOREARM BOTTLES DRAWN AEROBIC ONLY  Final   Special Requests Blood Culture adequate volume  Final   Culture   Final    NO GROWTH 1 DAY Performed at [facility], [address], [place], [place] [address]    Report Status PENDING  Incomplete  Blood Culture (routine x 2)     Status: None (Preliminary result)   Collection Time: 09/17/21  5:05 AM   Specimen: BLOOD RIGHT FOREARM  Result Value Ref Range Status   Specimen Description   Final    BLOOD RIGHT FOREARM BOTTLES DRAWN AEROBIC AND ANAEROBIC   Special Requests Blood Culture adequate volume  Final   Culture   Final    NO GROWTH 1 DAY Performed at [facility], [address]  [place], [place] [address]    Report Status PENDING  Incomplete     Labs: BNP (last 3 results) No results for input(s): BNP in the last 8760 hours. Basic Metabolic Panel: Recent Labs  Lab 09/17/21 0505  09/18/21 0432  NA 137 140  K 3.8 3.5  CL 104 109  CO2 25 24  GLUCOSE 177* 101*  BUN 22 20  CREATININE 1.14* 1.00  CALCIUM 9.4 9.3  MG  --  2.0   Liver Function Tests: Recent Labs  Lab 09/17/21 0505  AST 30  ALT 18  ALKPHOS 58  BILITOT 0.9  PROT 7.8  ALBUMIN 3.9   No results for input(s): LIPASE, AMYLASE in the last 168 hours. No results for input(s): AMMONIA in the last 168 hours. CBC: Recent Labs  Lab 09/17/21 0505 09/18/21 0432  WBC 15.1* 7.7  NEUTROABS 13.6* 5.7  HGB 13.2 13.0  HCT 41.8 41.4  MCV 87.4 87.5  PLT 175 160   Cardiac Enzymes: No results for input(s): CKTOTAL, CKMB, CKMBINDEX, TROPONINI in the last 168 hours. BNP: Invalid input(s): POCBNP CBG: Recent Labs  Lab 09/17/21 0441 09/17/21 2158 09/18/21 0725 09/18/21 1121  GLUCAP 190* 123* 111* 120*   D-Dimer No results for input(s): DDIMER in the last 72 hours. Hgb A1c Recent Labs    09/17/21 1004  HGBA1C 6.2*   Lipid Profile No results for input(s): CHOL, HDL, LDLCALC, TRIG, CHOLHDL, LDLDIRECT in the last 72 hours. Thyroid function studies No results for input(s): TSH, T4TOTAL, T3FREE, THYROIDAB in the last 72 hours.  Invalid input(s): FREET3 Anemia work up No results for input(s): VITAMINB12, FOLATE, FERRITIN, TIBC, IRON, RETICCTPCT in the last 72 hours. Urinalysis    Component Value Date/Time   COLORURINE YELLOW 09/17/2021 [place] 09/17/2021 0452   LABSPEC 1.011 09/17/2021 0452   PHURINE 7.0 09/17/2021 0452   GLUCOSEU NEGATIVE 09/17/2021 0452   HGBUR MODERATE (A) 09/17/2021 0452   BILIRUBINUR NEGATIVE 09/17/2021 0452   KETONESUR 5 (A) 09/17/2021 0452   PROTEINUR 30 (A) 09/17/2021 0452   NITRITE NEGATIVE 09/17/2021 0452   LEUKOCYTESUR NEGATIVE 09/17/2021  0452   Sepsis Labs Invalid input(s): PROCALCITONIN,  WBC,  LACTICIDVEN Microbiology Recent Results (from the past 240 hour(s))  SARS Coronavirus 2 by RT PCR (hospital order, performed in [place] hospital lab) *cepheid single result test* Anterior Nasal Swab     Status: None   Collection Time: 09/17/21  4:52 AM   Specimen: Anterior Nasal Swab  Result Value Ref Range Status   SARS Coronavirus 2 by RT PCR NEGATIVE NEGATIVE Final    Comment: (NOTE) SARS-CoV-2 target nucleic acids are NOT DETECTED.  The SARS-CoV-2 RNA is generally detectable in upper and lower respiratory specimens during the acute phase of infection. The lowest concentration of SARS-CoV-2 viral copies this assay can detect is 250 copies / mL. A negative result does not preclude SARS-CoV-2 infection and should not be used as the sole basis for treatment or other patient management decisions.  A negative result may occur with improper specimen collection / handling, submission of specimen other than nasopharyngeal swab, presence of viral mutation(s) within the areas targeted by this assay, and inadequate number of viral copies (<250 copies / mL). A negative result must be combined with clinical observations, patient history, and epidemiological information.  Fact Sheet for Patients:   https://www.patel.info/  Fact Sheet for Healthcare Providers: https://hall.com/  This test is not yet approved or  cleared by the [place] FDA and has been authorized for detection and/or diagnosis of SARS-CoV-2 by FDA under an Emergency Use Authorization (EUA).  This EUA will remain in effect (meaning this test can be used) for the duration of the COVID-19 declaration under Section 564(b)(1) of the  Act, 21 U.S.C. section 360bbb-3(b)(1), unless the authorization is terminated or revoked sooner.  Performed at [facility], [address] [place], [place] [address]   Urine Culture      Status: Abnormal (Preliminary result)   Collection Time: 09/17/21  4:52 AM   Specimen: In/Out Cath Urine  Result Value Ref Range Status   Specimen Description   Final    IN/OUT CATH URINE Performed at [facility], [address] [place], [place] [address]    Special Requests   Final    NONE Performed at [facility], [address], [place], [place] [address]    Culture (A)  Final    >=100,000 COLONIES/mL ESCHERICHIA COLI SUSCEPTIBILITIES TO FOLLOW Performed at [place] [address], [place], [place] [address]    Report Status PENDING  Incomplete  Blood Culture (routine x 2)     Status: None (Preliminary result)   Collection Time: 09/17/21  5:05 AM   Specimen: BLOOD LEFT FOREARM  Result Value Ref Range Status   Specimen Description BLOOD LEFT FOREARM BOTTLES DRAWN AEROBIC ONLY  Final   Special Requests Blood Culture adequate volume  Final   Culture   Final    NO GROWTH 1 DAY Performed at [facility], [address] [place], [place] [address]    Report Status PENDING  Incomplete  Blood Culture (routine x 2)     Status: None (Preliminary result)   Collection Time: 09/17/21  5:05 AM   Specimen: BLOOD RIGHT FOREARM  Result Value Ref Range Status   Specimen Description   Final    BLOOD RIGHT FOREARM BOTTLES DRAWN AEROBIC AND ANAEROBIC   Special Requests Blood Culture adequate volume  Final   Culture   Final    NO GROWTH 1 DAY Performed at [facility], [address] [place], [place] [address]    Report Status PENDING  Incomplete   Time coordinating discharge: 35 mins  SIGNED:  Irwin Brakeman, MD  [facility] 09/18/2021, 6:19 PM How to contact the [facility] Attending or Consulting provider [place] or covering provider during after hours [place], for this patient?  Check the care team in [facility] and look for a) attending/consulting TR[facility] provider listed and b) the [facility] team listed Log into [email or web address] and use [place]'s universal password to access. If you do  not have the password, please contact the hospital operator. Locate the [facility] provider you are looking for under [facility] and page to a number that you can be directly reached. If you still have difficulty reaching the provider, please page the [facility] (Director on Call) for the Hospitalists listed on amion for assistance.

## 2021-09-18 NOTE — Plan of Care (Cosign Needed)
  Problem: Acute Rehab PT Goals(only PT should resolve) Goal: Pt Will Go Supine/Side To Sit Outcome: Progressing Flowsheets (Taken 09/18/2021 1401) Pt will go Supine/Side to Sit:  Independently  with modified independence Goal: Patient Will Transfer Sit To/From Stand Outcome: Progressing Flowsheets (Taken 09/18/2021 1401) Patient will transfer sit to/from stand:  Independently  with modified independence Goal: Pt Will Transfer Bed To Chair/Chair To Bed Outcome: Progressing Flowsheets (Taken 09/18/2021 1401) Pt will Transfer Bed to Chair/Chair to Bed:  Independently  with modified independence Goal: Pt Will Perform Standing Balance Or Pre-Gait Outcome: Progressing Flowsheets (Taken 09/18/2021 1401) Pt will perform standing balance or pre-gait:  with Modified Independent  with Supervision  3- 5 min  with no UE support Goal: Pt Will Ambulate Outcome: Progressing Flowsheets (Taken 09/18/2021 1401) Pt will Ambulate:  > 125 feet  with supervision  with modified independence  2:02 PM, 09/18/21 Arther Abbott, S/PT

## 2021-09-18 NOTE — TOC Transition Note (Signed)
Transition of Care [facility]) - CM/SW Discharge Note   Patient Details  Name: Dawn Conner MRN: [id number] Date of Birth: 01-Feb-1928  [facility] CM/SW Contact:  Villa Herb, LCSWA Phone Number: 09/18/2021, 11:34 AM   Clinical Narrative:    TOC updated that PT and OT are recommending SNF at D/C. However, CSW spoke with pts daughter about this to update that pts insurance will not cover SNF at this time. CSW inquired about interest in [facility] at D/C. Pts daughter is agreeable to [facility] and would only like HH PT and OT services. She would like to use [facility] at this time. CSW requested that [facility] orders. CSW spoke to [place] with [facility] who accepts [facility] referral. TOC signing off.   Final next level of care: Home w Home Health Services Barriers to Discharge: No Barriers Identified   Patient Goals and CMS Choice Patient states their goals for this hospitalization and ongoing recovery are:: Home with [facility] CMS Medicare.gov Compare Post Acute Care list provided to:: Patient Choice offered to / list presented to : Patient, Adult Children  Discharge Placement                       Discharge Plan and Services                          HH Arranged: PT, OT [facility] Agency: [facility] Date [facility] Agency Contacted: 09/18/21   Representative spoke with at [facility] Agency: Bonita Quin  Social Determinants of Health (SDOH) Interventions     Readmission Risk Interventions     View : No data to display.

## 2021-09-19 LAB — URINE CULTURE: Culture: >=100000 — AB

## 2021-09-21 ENCOUNTER — Telehealth: Payer: Self-pay

## 2021-09-21 NOTE — Telephone Encounter (Signed)
FYI Cynthia-PT w/[facility]  Requesting for verbal orders for the following:   1 time a wk for 1 wk and 2 times a wk for 4 wk and 1 time a wk for 4 wk.  Verbals "ok" to go ahead and proceed on behalf of Dr. Dennard Schaumann.

## 2021-09-22 LAB — CULTURE, BLOOD (ROUTINE X 2)
Culture: NO GROWTH
Culture: NO GROWTH
Special Requests: ADEQUATE
Special Requests: ADEQUATE

## 2021-09-28 ENCOUNTER — Telehealth: Payer: Self-pay | Admitting: Family Medicine

## 2021-09-28 NOTE — Telephone Encounter (Signed)
Ester from [facility] called to get verbal for evaluation of O2 to be moved from this week to next week per patients request.  CB# [phone]

## 2021-09-28 NOTE — Telephone Encounter (Signed)
Please advise. Thank you

## 2021-10-01 ENCOUNTER — Telehealth: Payer: Self-pay

## 2021-10-01 NOTE — Telephone Encounter (Signed)
Short Term FMLA put into Dr. Samella Parr file to be completed

## 2021-10-02 ENCOUNTER — Ambulatory Visit (INDEPENDENT_AMBULATORY_CARE_PROVIDER_SITE_OTHER): Payer: Medicare Other | Admitting: Family Medicine

## 2021-10-02 ENCOUNTER — Encounter: Payer: Self-pay | Admitting: Family Medicine

## 2021-10-02 VITALS — BP 114/62 | HR 72 | Ht 60.0 in | Wt 103.0 lb

## 2021-10-02 DIAGNOSIS — E039 Hypothyroidism, unspecified: Secondary | ICD-10-CM

## 2021-10-02 DIAGNOSIS — N3001 Acute cystitis with hematuria: Secondary | ICD-10-CM

## 2021-10-02 NOTE — Progress Notes (Signed)
Subjective:    Patient ID: Dawn Conner, female    DOB: [birth year], 88 y.o.   MRN: [id number]  HPI  Admitted to hospital 09/17/21-09/18/21- see DC summary below for reference:  88 year old female with moderate vascular dementia, intermittent confusion, hypertension, hypothyroidism, hyperlipidemia, osteoporosis, insufficiency was brought in by EMS to the emergency department due to increasing weakness, decreased appetite and poor oral intake.  Her daughter called EMS because she became concerned about progressive symptoms of weakness.  For the past 24 hours she has had very poor oral intake.  She is confused at baseline and did not have any specific complaints to report.  In the ED she was noted to have a rectal temperature of 101.  She had a leukocytosis with a WBC of 15.  Her urinalysis was unrevealing.  Her chest x-ray was unrevealing.  She had presented with mild tachycardia and elevated blood pressures.  She was diagnosed with SIRS and admission was requested for observation.   Hospital Course by Problem   Assessment and Plan: * SIRS (systemic inflammatory response syndrome) (HCC) - SIRS physiology resolved    Prediabetes -- as evidenced by A1c of 6.2% -- diet controlled    Hyperglycemia - prediabetes and diet controlled   Chronic confusional state - This is her baseline according to family and records - Delirium precautions ordered.   Generalized weakness - Multifactorial given advanced age and comorbidities - PT eval recommending SNF. Daughter wants to take home with home health services.    Leukocytosis - WBC has normalized today.     Fever - Source of infection unknown at this time however she does have an elevated procalcitonin and will treat with doxycycline 100 mg p.o. twice daily and provide supportive measures -Repeat chest x-ray no pneumonia seen -- no recurrence of fever -- WBC is normalized today  -- DC home, complete 3 days of doxycycline   Failure to thrive in  adult - Poor oral intake is concerning - Encourage assistance with feeding -- home health services ordered   Alternating constipation and diarrhea - This is chronic we will manage as needed -- she has not been having symptoms in hospital    Vascular dementia (HCC) - Resume home donezepil 5 mg every evening -Delirium precautions ordered - Resume home simvastatin, aspirin   Hyperlipidemia - Resume home simvastatin every evening   Hypothyroidism - Resume home levothyroxine supplement   Essential hypertension -Currently on no blood pressure medications from home -Add amlodipine 5 mg daily  10/02/21 Since discharge from the hospital, the patient's daughter states that she turned around dramatically within the first 24 hours.  After being on antibiotics, she was able to start walking again.  Some of the sedatives cleared her system, her overall level of alertness improved.  She is now back to her baseline.  Within 24 hours she was able to stand up and walk at home.  She was no longer slurring her speech.  She was back to her baseline mental status.  Since discharge from the hospital, her blood cultures have returned negative.  Her urine culture returned positive for E. coli.  I suspect that the patient suffered a urinary tract infection and that the initial urinalysis was inaccurate although it did show hematuria.  However she was discharged home on doxycycline for an additional 3 days.  She denies any abdominal pain.  Daughter denies any urinary frequency or urgency or fever or chills.  She has not been lethargic.  She is  been eating and drinking normally.  She been ambulating normally.  Therefore clinically she is back to her baseline.  The day prior to hospital admission, the patient had a large diarrhea "blowout" bowel movement.  I believe that this is likely where the contracted a urinary tract infection. Past Medical History:  Diagnosis Date   Dementia (HCC)    Diverticulosis     Hyperlipidemia    Hypertension    Osteoporosis    Oth fracture of shaft of right humerus, init for opn fx 1990's   Renal insufficiency    Thyroid disease    Past Surgical History:  Procedure Laterality Date   ABDOMINAL HYSTERECTOMY  1972   APPENDECTOMY     cataract surgery     Early 2000's both eyes    CHOLECYSTECTOMY  1980's   COLONOSCOPY  03/10/2006   [place]; severe diverticulosis especially involving the sigmoid and to a lesser extent all other segments as well.  There was no evidence of neoplastic pathology. Recommended continuing Metamucil as well as Questran which appeared to keep her fairly regular. Consider repeat in 5 years.   OVARIAN CYST REMOVAL  09/2012   TONSILLECTOMY  age 82   Current Outpatient Medications on File Prior to Visit  Medication Sig Dispense Refill   amLODipine (NORVASC) 5 MG tablet Take 1 tablet (5 mg total) by mouth daily. 30 tablet 1   aspirin EC 81 MG tablet Take 81 mg by mouth every other day.     calcium carbonate (TUMS - DOSED IN MG ELEMENTAL CALCIUM) 500 MG chewable tablet Chew 1 tablet by mouth. 1-2 meals per day     Cholecalciferol (VITAMIN D3) 25 MCG (1000 UT) CAPS Take 1-2 capsules (1,000-2,000 Units total) by mouth 2 (two) times daily. 1 tablet in morning and 2 tablets in evening     donepezil (ARICEPT) 5 MG tablet Take 1 tablet (5 mg total) by mouth at bedtime. 90 tablet 3   erythromycin ophthalmic ointment Place 1 application. into the left eye 2 (two) times daily as needed. Place a 1/2 inch ribbon of ointment into the left lower eyelid twice daily as needed     feeding supplement (ENSURE ENLIVE / ENSURE PLUS) LIQD Take 237 mLs by mouth 3 (three) times daily between meals. 21330 mL 1   levothyroxine (SYNTHROID) 75 MCG tablet TAKE 1 TABLET BY MOUTH BEFORE BREAKFAST 90 tablet 3   loperamide (IMODIUM) 2 MG capsule Take 2 mg by mouth as needed for diarrhea or loose stools.     Multiple Minerals-Vitamins (CALCIUM-MAGNESIUM-ZINC-D3 PO) Take 1-2  tablets by mouth daily.     simvastatin (ZOCOR) 20 MG tablet Take 1 tablet (20 mg total) by mouth at bedtime. 90 tablet 3   Current Facility-Administered Medications on File Prior to Visit  Medication Dose Route Frequency Provider Last Rate Last Admin   denosumab (PROLIA) injection 60 mg  60 mg Subcutaneous Q6 months Milinda Antis F, MD   60 mg at 04/17/20 1517   Allergies  Allergen Reactions   Sulfa Antibiotics    Social History   Socioeconomic History   Marital status: Widowed    Spouse name: Not on file   Number of children: Not on file   Years of education: Not on file   Highest education level: Not on file  Occupational History   Not on file  Tobacco Use   Smoking status: Never   Smokeless tobacco: Never  Vaping Use   Vaping Use: Never used  Substance and Sexual  Activity   Alcohol use: Never   Drug use: Never   Sexual activity: Not Currently  Other Topics Concern   Not on file  Social History Narrative   Lives with [name] and son in law.    Social Determinants of Health   Financial Resource Strain: Low Risk  (04/20/2021)   Overall Financial Resource Strain (CARDIA)    Difficulty of Paying Living Expenses: Not hard at all  Food Insecurity: No Food Insecurity (04/20/2021)   Hunger Vital Sign    Worried About Running Out of Food in the Last Year: Never true    Ran Out of Food in the Last Year: Never true  Transportation Needs: No Transportation Needs (04/20/2021)   PRAPARE - Administrator, Civil Service (Medical): No    Lack of Transportation (Non-Medical): No  Physical Activity: Insufficiently Active (04/20/2021)   Exercise Vital Sign    Days of Exercise per Week: 4 days    Minutes of Exercise per Session: 10 min  Stress: No Stress Concern Present (04/20/2021)   [employer] of Occupational Health - Occupational Stress Questionnaire    Feeling of Stress : Not at all  Social Connections: Moderately Integrated (04/20/2021)   Social  Connection and Isolation Panel [NHANES]    Frequency of Communication with Friends and Family: Twice a week    Frequency of Social Gatherings with Friends and Family: More than three times a week    Attends Religious Services: 1 to 4 times per year    Active Member of [employer] or Organizations: Yes    Attends Banker Meetings: 1 to 4 times per year    Marital Status: Widowed  Intimate Partner Violence: Not At Risk (04/20/2021)   Humiliation, Afraid, Rape, and Kick questionnaire    Fear of Current or Ex-Partner: No    Emotionally Abused: No    Physically Abused: No    Sexually Abused: No     Review of Systems  All other systems reviewed and are negative.      Objective:   Physical Exam Vitals reviewed.  Constitutional:      General: She is not in acute distress.    Appearance: Normal appearance. She is normal weight. She is not toxic-appearing.  Neck:     Vascular: No carotid bruit.  Cardiovascular:     Rate and Rhythm: Normal rate and regular rhythm.     Heart sounds: Murmur heard.     No friction rub. No gallop.  Pulmonary:     Effort: Pulmonary effort is normal. No respiratory distress.     Breath sounds: Normal breath sounds. No stridor. No wheezing or rales.  Abdominal:     General: Abdomen is flat. Bowel sounds are normal. There is no distension.     Palpations: Abdomen is soft.     Tenderness: There is no abdominal tenderness. There is no guarding or rebound.  Musculoskeletal:     Right lower leg: No edema.     Left lower leg: No edema.  Lymphadenopathy:     Cervical: No cervical adenopathy.  Neurological:     General: No focal deficit present.     Mental Status: She is alert.     Cranial Nerves: No cranial nerve deficit.     Motor: No weakness.  Psychiatric:        Mood and Affect: Affect is flat.        Cognition and Memory: Cognition is impaired. Memory is impaired. She exhibits impaired  recent memory.          Assessment & Plan:   Acute  cystitis with hematuria - Plan: CBC with Differential/Platelet, COMPLETE METABOLIC PANEL WITH GFR  Acquired hypothyroidism - Plan: TSH Patient is now back to her baseline mental status.  In the hospital they started amlodipine.  Today her blood pressure is actually kind of low.  I recommended that we stop amlodipine to avoid polypharmacy and falls and dizziness.  She does have prediabetes with a hemoglobin A1c of 6.2.  I recommended that we recheck that in 6 months.  While she is here today I will repeat a CBC to monitor for no recurrence of her leukocytosis.  I will monitor CMP.  I will check a TSH while drawing lab work to monitor her hypothyroidism.  If labs are normal I would recommend rechecking the patient in 6 months or as needed.

## 2021-10-03 LAB — CBC WITH DIFFERENTIAL/PLATELET
Absolute Monocytes: 682 cells/uL (ref 200–950)
Basophils Absolute: 43 cells/uL (ref 0–200)
Basophils Relative: 0.6 %
Eosinophils Absolute: 99 cells/uL (ref 15–500)
Eosinophils Relative: 1.4 %
HCT: 39.3 % (ref 35.0–45.0)
Hemoglobin: 12.9 g/dL (ref 11.7–15.5)
Lymphs Abs: 1882 cells/uL (ref 850–3900)
MCH: 28.4 pg (ref 27.0–33.0)
MCHC: 32.8 g/dL (ref 32.0–36.0)
MCV: 86.4 fL (ref 80.0–100.0)
MPV: 11.4 fL (ref 7.5–12.5)
Monocytes Relative: 9.6 %
Neutro Abs: 4395 cells/uL (ref 1500–7800)
Neutrophils Relative %: 61.9 %
Platelets: 246 10*3/uL (ref 140–400)
RBC: 4.55 10*6/uL (ref 3.80–5.10)
RDW: 14 % (ref 11.0–15.0)
Total Lymphocyte: 26.5 %
WBC: 7.1 10*3/uL (ref 3.8–10.8)

## 2021-10-03 LAB — COMPLETE METABOLIC PANEL WITH GFR
AG Ratio: 1.2 (calc) (ref 1.0–2.5)
ALT: 15 U/L (ref 6–29)
AST: 24 U/L (ref 10–35)
Albumin: 3.8 g/dL (ref 3.6–5.1)
Alkaline phosphatase (APISO): 55 U/L (ref 37–153)
BUN/Creatinine Ratio: 21 (calc) (ref 6–22)
BUN: 25 mg/dL (ref 7–25)
CO2: 27 mmol/L (ref 20–32)
Calcium: 9.6 mg/dL (ref 8.6–10.4)
Chloride: 105 mmol/L (ref 98–110)
Creat: 1.18 mg/dL — ABNORMAL HIGH (ref 0.60–0.95)
Globulin: 3.2 g/dL (calc) (ref 1.9–3.7)
Glucose, Bld: 144 mg/dL — ABNORMAL HIGH (ref 65–99)
Potassium: 4.7 mmol/L (ref 3.5–5.3)
Sodium: 140 mmol/L (ref 135–146)
Total Bilirubin: 0.4 mg/dL (ref 0.2–1.2)
Total Protein: 7 g/dL (ref 6.1–8.1)
eGFR: 43 mL/min/{1.73_m2} — ABNORMAL LOW (ref >=60)

## 2021-10-03 LAB — TSH: TSH: 0.96 mIU/L (ref 0.40–4.50)

## 2021-10-03 NOTE — Telephone Encounter (Signed)
Call Dawn Conner w/ [facility], stated that it was "ok"  per Dr. Tanya Nones to have evaluation of O2 to be moved from this week to next week. Expressed understanding and nothing at this time.

## 2021-10-09 ENCOUNTER — Telehealth: Payer: Self-pay

## 2021-10-09 DIAGNOSIS — E785 Hyperlipidemia, unspecified: Secondary | ICD-10-CM

## 2021-10-09 DIAGNOSIS — I10 Essential (primary) hypertension: Secondary | ICD-10-CM | POA: Diagnosis not present

## 2021-10-09 DIAGNOSIS — E039 Hypothyroidism, unspecified: Secondary | ICD-10-CM | POA: Diagnosis not present

## 2021-10-09 DIAGNOSIS — M810 Age-related osteoporosis without current pathological fracture: Secondary | ICD-10-CM

## 2021-10-09 DIAGNOSIS — R7303 Prediabetes: Secondary | ICD-10-CM

## 2021-10-09 DIAGNOSIS — F0280 Dementia in other diseases classified elsewhere without behavioral disturbance: Secondary | ICD-10-CM | POA: Diagnosis not present

## 2021-10-09 DIAGNOSIS — R627 Adult failure to thrive: Secondary | ICD-10-CM | POA: Diagnosis not present

## 2021-10-09 NOTE — Telephone Encounter (Signed)
[employer] FORM completed and fax back  [phone]

## 2021-10-10 ENCOUNTER — Telehealth: Payer: Self-pay | Admitting: Family Medicine

## 2021-10-10 NOTE — Telephone Encounter (Signed)
Aceia from [facility] left vm stating patient at this time doesn't need skilled OT. If you need to call to discuss further her number is [phone]

## 2021-10-12 ENCOUNTER — Ambulatory Visit: Payer: Medicare Other | Admitting: Family Medicine

## 2021-10-17 ENCOUNTER — Encounter: Payer: Self-pay | Admitting: Internal Medicine

## 2021-10-19 ENCOUNTER — Ambulatory Visit: Payer: Self-pay | Admitting: Family Medicine

## 2021-12-19 NOTE — Progress Notes (Unsigned)
Referring Provider: Donita Brooks, MD Primary Care Physician:  Donita Brooks, MD Primary GI Physician: Dr. Marletta Lor  No chief complaint on file.   HPI:   Dawn Conner is a 88 y.o. female history of dementia, hypothyroidism, HTN, HLD, osteoporosis, and chronic history of intermittent diarrhea likely secondary to lactose intolerance and bile salt diarrhea s/p cholecystectomy, well controlled with tums before meals and a lactose free diet.  Previously, TSH within normal limits on medication and celiac screen negative.  Developed intermittent constipation with associated left-sided abdominal discomfort with abdominal x ray in December 2022 revealing mild fecal loading in the region of the rectum and sigmoid colon. Noted improvement with benefiber. She is presenting today for follow-up.   Last seen via virtual visit 05/23/2021 with information primarily provided by the daughter.  Reportedly had trouble getting her mother to take powdered Benefiber, but she was taking 1 teaspoon of powdered Benefiber and also started taking 1 Benefiber gummy daily 2 weeks prior.  With this, her bowels were moving much better.  Abdominal pain had resolved.  Recommended she continue Benefiber Gummies rather than Benefiber powder, can increase to max of 4 g/day.  If Benefiber alone did not control constipation well, or she had breakthrough, use MiraLAX as needed.  Continue Tums before meals and following a lactose-free diet.  Recommended 30-month follow-up.  Today:     Past Medical History:  Diagnosis Date   Dementia (HCC)    Diverticulosis    Hyperlipidemia    Hypertension    Osteoporosis    Oth fracture of shaft of right humerus, init for opn fx 1990's   Renal insufficiency    Thyroid disease     Past Surgical History:  Procedure Laterality Date   ABDOMINAL HYSTERECTOMY  1972   APPENDECTOMY     cataract surgery     Early 2000's both eyes    CHOLECYSTECTOMY  1980's   COLONOSCOPY  03/10/2006    [place]; severe diverticulosis especially involving the sigmoid and to a lesser extent all other segments as well.  There was no evidence of neoplastic pathology. Recommended continuing Metamucil as well as Questran which appeared to keep her fairly regular. Consider repeat in 5 years.   OVARIAN CYST REMOVAL  09/2012   TONSILLECTOMY  age 29    Current Outpatient Medications  Medication Sig Dispense Refill   aspirin EC 81 MG tablet Take 81 mg by mouth every other day.     calcium carbonate (TUMS - DOSED IN MG ELEMENTAL CALCIUM) 500 MG chewable tablet Chew 1 tablet by mouth. 1-2 meals per day     Cholecalciferol (VITAMIN D3) 25 MCG (1000 UT) CAPS Take 1-2 capsules (1,000-2,000 Units total) by mouth 2 (two) times daily. 1 tablet in morning and 2 tablets in evening     donepezil (ARICEPT) 5 MG tablet Take 1 tablet (5 mg total) by mouth at bedtime. 90 tablet 3   erythromycin ophthalmic ointment Place 1 application. into the left eye 2 (two) times daily as needed. Place a 1/2 inch ribbon of ointment into the left lower eyelid twice daily as needed     levothyroxine (SYNTHROID) 75 MCG tablet TAKE 1 TABLET BY MOUTH BEFORE BREAKFAST 90 tablet 3   loperamide (IMODIUM) 2 MG capsule Take 2 mg by mouth as needed for diarrhea or loose stools.     Multiple Minerals-Vitamins (CALCIUM-MAGNESIUM-ZINC-D3 PO) Take 1-2 tablets by mouth daily.     simvastatin (ZOCOR) 20 MG tablet Take 1 tablet (20  mg total) by mouth at bedtime. 90 tablet 3   Current Facility-Administered Medications  Medication Dose Route Frequency Provider Last Rate Last Admin   denosumab (PROLIA) injection 60 mg  60 mg Subcutaneous Q6 months Milinda Antis F, MD   60 mg at 04/17/20 1517    Allergies as of 12/20/2021 - Review Complete 10/02/2021  Allergen Reaction Noted   Sulfa antibiotics  11/09/2018    Family History  Problem Relation Age of Onset   Colon cancer Neg Hx     Social History   Socioeconomic History   Marital  status: Widowed    Spouse name: Not on file   Number of children: Not on file   Years of education: Not on file   Highest education level: Not on file  Occupational History   Not on file  Tobacco Use   Smoking status: Never   Smokeless tobacco: Never  Vaping Use   Vaping Use: Never used  Substance and Sexual Activity   Alcohol use: Never   Drug use: Never   Sexual activity: Not Currently  Other Topics Concern   Not on file  Social History Narrative   Lives with [name] and son in Social worker.    Social Determinants of Health   Financial Resource Strain: Low Risk  (04/20/2021)   Overall Financial Resource Strain (CARDIA)    Difficulty of Paying Living Expenses: Not hard at all  Food Insecurity: No Food Insecurity (04/20/2021)   Hunger Vital Sign    Worried About Running Out of Food in the Last Year: Never true    Ran Out of Food in the Last Year: Never true  Transportation Needs: No Transportation Needs (04/20/2021)   PRAPARE - Administrator, Civil Service (Medical): No    Lack of Transportation (Non-Medical): No  Physical Activity: Insufficiently Active (04/20/2021)   Exercise Vital Sign    Days of Exercise per Week: 4 days    Minutes of Exercise per Session: 10 min  Stress: No Stress Concern Present (04/20/2021)   [employer] of Occupational Health - Occupational Stress Questionnaire    Feeling of Stress : Not at all  Social Connections: Moderately Integrated (04/20/2021)   Social Connection and Isolation Panel [NHANES]    Frequency of Communication with Friends and Family: Twice a week    Frequency of Social Gatherings with Friends and Family: More than three times a week    Attends Religious Services: 1 to 4 times per year    Active Member of [employer] or Organizations: Yes    Attends Banker Meetings: 1 to 4 times per year    Marital Status: Widowed    Review of Systems: Gen: Denies fever, chills, cold or flulike symptoms, presyncope,  syncope. CV: Denies chest pain, palpitations.  Resp: Denies dyspnea, cough.  GI: See HPI Heme: See HPI  Physical Exam: There were no vitals taken for this visit. General:   Alert and oriented. No distress noted. Pleasant and cooperative.  Head:  Normocephalic and atraumatic. Eyes:  Conjuctiva clear without scleral icterus. Heart:  S1, S2 present without murmurs appreciated. Lungs:  Clear to auscultation bilaterally. No wheezes, rales, or rhonchi. No distress.  Abdomen:  +BS, soft, non-tender and non-distended. No rebound or guarding. No HSM or masses noted. Msk:  Symmetrical without gross deformities. Normal posture. Extremities:  Without edema. Neurologic:  Alert and  oriented x4 Psych:  Normal mood and affect.    Assessment:     Plan:  ***  Ermalinda Memos, PA-C [facility] 12/20/2021

## 2021-12-20 ENCOUNTER — Ambulatory Visit (INDEPENDENT_AMBULATORY_CARE_PROVIDER_SITE_OTHER): Payer: Medicare Other | Admitting: Gastroenterology

## 2021-12-20 ENCOUNTER — Encounter: Payer: Self-pay | Admitting: Gastroenterology

## 2021-12-20 VITALS — BP 159/73 | HR 80 | Temp 98.2°F | Ht 60.0 in | Wt 104.6 lb

## 2021-12-20 DIAGNOSIS — L290 Pruritus ani: Secondary | ICD-10-CM | POA: Diagnosis not present

## 2021-12-20 DIAGNOSIS — R198 Other specified symptoms and signs involving the digestive system and abdomen: Secondary | ICD-10-CM

## 2021-12-20 NOTE — Patient Instructions (Signed)
Try mixing 2 teaspoons of Benefiber powder into hot tea daily.  You may also continue with 1 Benefiber tablet either in the morning or evening, which ever time you are not taking Benefiber powder.  You may try decreasing frequency of Tums to see if this allows for more productive bowel movements.  To help with perianal irritation, be sure to keep this area clean and dry.  You can also use a skin barrier/protectant such as zinc oxide.   We will follow-up in 6 months or sooner if needed.   It was good to see you again today!   Ermalinda Memos, PA-C [facility]

## 2021-12-27 ENCOUNTER — Ambulatory Visit: Payer: Medicare Other | Admitting: Internal Medicine

## 2021-12-28 ENCOUNTER — Ambulatory Visit: Payer: Medicare Other | Admitting: Family Medicine

## 2022-01-04 ENCOUNTER — Ambulatory Visit (INDEPENDENT_AMBULATORY_CARE_PROVIDER_SITE_OTHER): Payer: Medicare Other | Admitting: Family Medicine

## 2022-01-04 VITALS — BP 130/90 | HR 63 | Temp 98.0°F | Ht 61.0 in | Wt 107.0 lb

## 2022-01-04 DIAGNOSIS — Z23 Encounter for immunization: Secondary | ICD-10-CM | POA: Diagnosis not present

## 2022-01-04 DIAGNOSIS — M7989 Other specified soft tissue disorders: Secondary | ICD-10-CM | POA: Diagnosis not present

## 2022-01-04 MED ORDER — LEVOTHYROXINE SODIUM 75 MCG PO TABS: ORAL_TABLET | ORAL | 3 refills | Status: DC

## 2022-01-04 NOTE — Addendum Note (Signed)
Addended by: Arta Silence on: 01/04/2022 02:51 PM   Modules accepted: Orders

## 2022-01-04 NOTE — Addendum Note (Signed)
Addended by: Arta Silence on: 01/04/2022 02:54 PM   Modules accepted: Orders

## 2022-01-04 NOTE — Progress Notes (Signed)
Subjective:    Patient ID: Dawn Conner, female    DOB: 05/31/1927, 88 y.o.   MRN: [id number]  HPI  88 y/o with 3a CKD and HTN, patient was recently at her gastroenterologist they were concerned that 1.  Patient has any chest pain shortness of breath dyspnea on exertion.  She denies any pleurisy or hemoptysis or orthopnea.  Today on examination she has trace bipedal edema.  It appears to be symmetric today on her exam.  She denies any leg pain.  She has negative Homans' sign bilaterally.  There is no erythema in the leg.  There is no asymmetry.  She does have varicose veins indicative of chronic venous insufficiency right greater than left.  Past Medical History:  Diagnosis Date   Dementia ([place])    Diverticulosis    Hyperlipidemia    Hypertension    Osteoporosis    Oth fracture of shaft of right humerus, init for opn fx 1990's   Renal insufficiency    Thyroid disease    Past Surgical History:  Procedure Laterality Date   ABDOMINAL HYSTERECTOMY  1972   APPENDECTOMY     cataract surgery     Early 2000's both eyes    CHOLECYSTECTOMY  1980's   COLONOSCOPY  03/10/2006   [place]; severe diverticulosis especially involving the sigmoid and to a lesser extent all other segments as well.  There was no evidence of neoplastic pathology. Recommended continuing Metamucil as well as Questran which appeared to keep her fairly regular. Consider repeat in 5 years.   OVARIAN CYST REMOVAL  09/2012   TONSILLECTOMY  age 39   Current Outpatient Medications on File Prior to Visit  Medication Sig Dispense Refill   calcium carbonate (TUMS - DOSED IN MG ELEMENTAL CALCIUM) 500 MG chewable tablet Chew 1 tablet by mouth. 1-2 meals per day     Cholecalciferol (VITAMIN D3) 25 MCG (1000 UT) CAPS Take 1-2 capsules (1,000-2,000 Units total) by mouth 2 (two) times daily. 1 tablet in morning and 2 tablets in evening     donepezil (ARICEPT) 5 MG tablet Take 1 tablet (5 mg total) by mouth at bedtime. 90 tablet 3    levothyroxine (SYNTHROID) 75 MCG tablet TAKE 1 TABLET BY MOUTH BEFORE BREAKFAST 90 tablet 3   Multiple Minerals-Vitamins (CALCIUM-MAGNESIUM-ZINC-D3 PO) Take 1-2 tablets by mouth daily.     simvastatin (ZOCOR) 20 MG tablet Take 1 tablet (20 mg total) by mouth at bedtime. 90 tablet 3   Wheat Dextrin (BENEFIBER) CHEW Chew 1 tablet by mouth daily.     Current Facility-Administered Medications on File Prior to Visit  Medication Dose Route Frequency Provider Last Rate Last Admin   denosumab (PROLIA) injection 60 mg  60 mg Subcutaneous Q6 months Vic Blackbird F, MD   60 mg at 04/17/20 1517   Allergies  Allergen Reactions   Sulfa Antibiotics    Social History   Socioeconomic History   Marital status: Widowed    Spouse name: Not on file   Number of children: Not on file   Years of education: Not on file   Highest education level: Not on file  Occupational History   Not on file  Tobacco Use   Smoking status: Never   Smokeless tobacco: Never  Vaping Use   Vaping Use: Never used  Substance and Sexual Activity   Alcohol use: Never   Drug use: Never   Sexual activity: Not Currently  Other Topics Concern   Not on file  Social History  Narrative   Lives with [name] and son in law.    Social Determinants of Health   Financial Resource Strain: Low Risk  (04/20/2021)   Overall Financial Resource Strain (CARDIA)    Difficulty of Paying Living Expenses: Not hard at all  Food Insecurity: No Food Insecurity (04/20/2021)   Hunger Vital Sign    Worried About Running Out of Food in the Last Year: Never true    Ran Out of Food in the Last Year: Never true  Transportation Needs: No Transportation Needs (04/20/2021)   PRAPARE - Administrator, Civil Service (Medical): No    Lack of Transportation (Non-Medical): No  Physical Activity: Insufficiently Active (04/20/2021)   Exercise Vital Sign    Days of Exercise per Week: 4 days    Minutes of Exercise per Session: 10 min   Stress: No Stress Concern Present (04/20/2021)   [employer] of Occupational Health - Occupational Stress Questionnaire    Feeling of Stress : Not at all  Social Connections: Moderately Integrated (04/20/2021)   Social Connection and Isolation Panel [NHANES]    Frequency of Communication with Friends and Family: Twice a week    Frequency of Social Gatherings with Friends and Family: More than three times a week    Attends Religious Services: 1 to 4 times per year    Active Member of [employer] or Organizations: Yes    Attends Banker Meetings: 1 to 4 times per year    Marital Status: Widowed  Intimate Partner Violence: Not At Risk (04/20/2021)   Humiliation, Afraid, Rape, and Kick questionnaire    Fear of Current or Ex-Partner: No    Emotionally Abused: No    Physically Abused: No    Sexually Abused: No     Review of Systems  All other systems reviewed and are negative.      Objective:   Physical Exam Vitals reviewed.  Constitutional:      General: She is not in acute distress.    Appearance: Normal appearance. She is normal weight. She is not toxic-appearing.  Neck:     Vascular: No carotid bruit.  Cardiovascular:     Rate and Rhythm: Normal rate and regular rhythm.     Heart sounds: Murmur heard.     No friction rub. No gallop.  Pulmonary:     Effort: Pulmonary effort is normal. No respiratory distress.     Breath sounds: Normal breath sounds. No stridor. No wheezing or rales.  Abdominal:     General: Abdomen is flat. Bowel sounds are normal. There is no distension.     Palpations: Abdomen is soft.     Tenderness: There is no abdominal tenderness. There is no guarding or rebound.  Musculoskeletal:     Right lower leg: No edema.     Left lower leg: No edema.  Lymphadenopathy:     Cervical: No cervical adenopathy.  Neurological:     General: No focal deficit present.     Mental Status: She is alert.     Cranial Nerves: No cranial nerve deficit.      Motor: No weakness.  Psychiatric:        Mood and Affect: Affect is flat.        Cognition and Memory: Cognition is impaired. Memory is impaired. She exhibits impaired recent memory.          Assessment & Plan:  Leg swelling Today there is no significant leg swelling.  There is no  evidence of congestive heart failure or fluid overload on exam.  There is no sign of a DVT.  I believe the leg swelling is most likely due to chronic venous insufficiency.  Today seems to be minimal.  Therefore we will simply monitor the situation clinically and if worsening possibly treated with as needed Lasix.  Blood pressure here is acceptable for age

## 2022-01-09 ENCOUNTER — Encounter: Payer: Self-pay | Admitting: Family Medicine

## 2022-02-03 ENCOUNTER — Other Ambulatory Visit: Payer: Self-pay | Admitting: Family Medicine

## 2022-02-04 NOTE — Telephone Encounter (Signed)
Requested medication (s) are due for refill today - expired Rx  Requested medication (s) are on the active medication list -yes  Future visit scheduled -yes  Last refill: 01/22/21 #90 3RF  Notes to clinic: expired Rx  Requested Prescriptions  Pending Prescriptions Disp Refills   donepezil (ARICEPT) 5 MG tablet [Pharmacy Med Name: Donepezil HCl 5 MG Oral Tablet] 90 tablet 0    Sig: TAKE 1 TABLET BY MOUTH AT BEDTIME     Neurology:  Alzheimer's Agents Failed - 02/03/2022  4:59 PM      Failed - Valid encounter within last 6 months    Recent Outpatient Visits           10 months ago Primary hypertension   [place] Pickard, Cammie Mcgee, MD   1 year ago Primary hypertension   [place] [facility] Schaumann, Cammie Mcgee, MD   1 year ago Routine general medical examination at a health care facility   [place], Modena Nunnery, MD   2 years ago Traumatic hematoma of head, initial encounter   [place], Modena Nunnery, MD   2 years ago Localized osteoporosis without current pathological fracture   [place], Modena Nunnery, MD       Future Appointments             In 1 month Pickard, Cammie Mcgee, MD [place]               Requested Prescriptions  Pending Prescriptions Disp Refills   donepezil (ARICEPT) 5 MG tablet [Pharmacy Med Name: Donepezil HCl 5 MG Oral Tablet] 90 tablet 0    Sig: TAKE 1 TABLET BY MOUTH AT BEDTIME     Neurology:  Alzheimer's Agents Failed - 02/03/2022  4:59 PM      Failed - Valid encounter within last 6 months    Recent Outpatient Visits           10 months ago Primary hypertension   [place] [facility] Schaumann, Cammie Mcgee, MD   1 year ago Primary hypertension   [place] Dennard Schaumann, Cammie Mcgee, MD   1 year ago Routine general medical examination at a health care facility   [place], Modena Nunnery,  MD   2 years ago Traumatic hematoma of head, initial encounter   [place], Modena Nunnery, MD   2 years ago Localized osteoporosis without current pathological fracture   [place], Modena Nunnery, MD       Future Appointments             In 1 month Pickard, Cammie Mcgee, MD [place]

## 2022-03-19 ENCOUNTER — Ambulatory Visit (HOSPITAL_COMMUNITY)
Admission: RE | Admit: 2022-03-19 | Discharge: 2022-03-19 | Disposition: A | Discharge status: Home / Self Care | Payer: Medicare Other | Source: Ambulatory Visit | Attending: Family Medicine | Admitting: Family Medicine

## 2022-03-19 ENCOUNTER — Encounter: Payer: Self-pay | Admitting: Family Medicine

## 2022-03-19 ENCOUNTER — Ambulatory Visit (INDEPENDENT_AMBULATORY_CARE_PROVIDER_SITE_OTHER): Payer: Medicare Other | Admitting: Family Medicine

## 2022-03-19 VITALS — BP 132/84 | HR 78 | Temp 97.5°F | Ht 61.0 in | Wt 104.0 lb

## 2022-03-19 DIAGNOSIS — W19XXXA Unspecified fall, initial encounter: Secondary | ICD-10-CM

## 2022-03-19 DIAGNOSIS — M79621 Pain in right upper arm: Secondary | ICD-10-CM | POA: Insufficient documentation

## 2022-03-19 NOTE — Progress Notes (Signed)
   Acute Office Visit  Subjective:     Patient ID: Dawn Conner, female    DOB: 08/04/1927, 88 y.o.   MRN: [id number]  Chief Complaint  Patient presents with   Follow-up    had a fall/bruising on right upper arm    HPI Patient is in today accompanied by her daughter who aids in history for bruising to right upper arm after a fall that occurred on Saturday night. The fall occurred at the end of a bath assisted by her daughter and she fell between the toilet and shower. Her daughter witnessed her hit her head, has not noted any bruising or bumps to her head, no LOC or mental status changes, baseline dementia. Daughter reports only noticing a bruise to her right upper arm but no complaints of pain, changes in range of motion, or limitations of use.  Review of Systems  Reason unable to perform ROS: see HPI.  All other systems reviewed and are negative.       Objective:    BP 132/84   Pulse 78   Temp (!) 97.5 F (36.4 C) (Oral)   Ht 5\' 1"  (1.549 m)   Wt 104 lb (47.2 kg)   SpO2 97%   BMI 19.65 kg/m    Physical Exam Vitals and nursing note reviewed.  Constitutional:      Appearance: Normal appearance. She is normal weight.  HENT:     Head: Normocephalic and atraumatic.  Musculoskeletal:     Right upper arm: Bony tenderness present. No swelling, edema, deformity or lacerations.  Skin:    General: Skin is warm and dry.     Findings: Bruising and signs of injury present.       Neurological:     General: No focal deficit present.     Mental Status: She is alert. Mental status is at baseline.     GCS: GCS eye subscore is 4. GCS verbal subscore is 5. GCS motor subscore is 6.  Psychiatric:        Mood and Affect: Mood normal.        Behavior: Behavior normal.        Thought Content: Thought content normal.        Judgment: Judgment normal.     No results found for any visits on 03/19/22.      Assessment & Plan:   Problem List Items Addressed This Visit       Other    Fall - Primary    No signs of head trauma or concerning neurological findings reported or noted on exam. Patient does have bony tenderness to palpation of right humerus so will obtain x-ray to evaluate for fracture. Seek emergent medical care for neurological changes, confusion, altered mental status.      Other Visit Diagnoses     Pain in right upper arm       Relevant Orders   DG Humerus Right       No orders of the defined types were placed in this encounter.   Return if symptoms worsen or fail to improve.  03/21/22, FNP

## 2022-03-19 NOTE — Assessment & Plan Note (Signed)
No signs of head trauma or concerning neurological findings reported or noted on exam. Patient does have bony tenderness to palpation of right humerus so will obtain x-ray to evaluate for fracture. Seek emergent medical care for neurological changes, confusion, altered mental status.

## 2022-03-27 ENCOUNTER — Ambulatory Visit (INDEPENDENT_AMBULATORY_CARE_PROVIDER_SITE_OTHER): Payer: Medicare Other

## 2022-03-27 DIAGNOSIS — M816 Localized osteoporosis [Lequesne]: Secondary | ICD-10-CM | POA: Diagnosis not present

## 2022-03-27 NOTE — Progress Notes (Signed)
Pt given prolia injection Sub-Q, pt tol well. Pt left ambulatory w/c/o

## 2022-04-04 ENCOUNTER — Encounter: Payer: Self-pay | Admitting: Family Medicine

## 2022-04-04 ENCOUNTER — Ambulatory Visit (INDEPENDENT_AMBULATORY_CARE_PROVIDER_SITE_OTHER): Payer: Medicare Other | Admitting: Family Medicine

## 2022-04-04 VITALS — BP 112/72 | HR 60 | Wt 105.0 lb

## 2022-04-04 DIAGNOSIS — N183 Chronic kidney disease, stage 3 unspecified: Secondary | ICD-10-CM

## 2022-04-04 DIAGNOSIS — F0150 Vascular dementia without behavioral disturbance: Secondary | ICD-10-CM

## 2022-04-04 DIAGNOSIS — E039 Hypothyroidism, unspecified: Secondary | ICD-10-CM

## 2022-04-04 DIAGNOSIS — I10 Essential (primary) hypertension: Secondary | ICD-10-CM

## 2022-04-04 NOTE — Progress Notes (Signed)
Subjective:    Patient ID: Dawn Conner, female    DOB: May 03, 1927, 88 y.o.   MRN: [id number]  HPI Patient is today with her daughter.  She has moderate to advanced vascular dementia.  She has fallen once since I last saw her.  She also is occasionally having fecal incontinence.  This usually occurs after she has been constipated for few days.  Her stool is soft and mushy at times.  At other times it is hard for her to go to the bathroom.  She is requiring increasing care at home.  Daughter is concerned that they may be at the point where she needs to be placed in a nursing facility.  There is no violent or disruptive behavior.  She is pleasantly confused and very quiet.  However she is requiring more more assistance with ADLs.  Daughter had to retire in order to care for her at home.  Patient denies any chest pain or shortness of breath or trouble breathing.  She denies any dysuria.  Past Medical History:  Diagnosis Date   Dementia ([facility])    Diverticulosis    Hyperlipidemia    Hypertension    Osteoporosis    Oth fracture of shaft of right humerus, init for opn fx 1990's   Renal insufficiency    Thyroid disease    Past Surgical History:  Procedure Laterality Date   ABDOMINAL HYSTERECTOMY  1972   APPENDECTOMY     cataract surgery     Early 2000's both eyes    CHOLECYSTECTOMY  1980's   COLONOSCOPY  03/10/2006   [place]; severe diverticulosis especially involving the sigmoid and to a lesser extent all other segments as well.  There was no evidence of neoplastic pathology. Recommended continuing Metamucil as well as Questran which appeared to keep her fairly regular. Consider repeat in 5 years.   OVARIAN CYST REMOVAL  09/2012   TONSILLECTOMY  age 19   Current Outpatient Medications on File Prior to Visit  Medication Sig Dispense Refill   calcium carbonate (OS-CAL - DOSED IN MG OF ELEMENTAL CALCIUM) 1250 (500 Ca) MG tablet Take 1 tablet by mouth.     calcium carbonate (TUMS - DOSED IN  MG ELEMENTAL CALCIUM) 500 MG chewable tablet Chew 1 tablet by mouth. 1-2 meals per day     Cholecalciferol (VITAMIN D3) 25 MCG (1000 UT) CAPS Take 1-2 capsules (1,000-2,000 Units total) by mouth 2 (two) times daily. 1 tablet in morning and 2 tablets in evening     donepezil (ARICEPT) 5 MG tablet TAKE 1 TABLET BY MOUTH AT BEDTIME 90 tablet 0   levothyroxine (SYNTHROID) 75 MCG tablet TAKE 1 TABLET BY MOUTH BEFORE BREAKFAST 90 tablet 3   Multiple Minerals-Vitamins (CALCIUM-MAGNESIUM-ZINC-D3 PO) Take 1-2 tablets by mouth daily.     simvastatin (ZOCOR) 20 MG tablet Take 1 tablet (20 mg total) by mouth at bedtime. 90 tablet 3   Wheat Dextrin (BENEFIBER) CHEW Chew 1 tablet by mouth daily.     Current Facility-Administered Medications on File Prior to Visit  Medication Dose Route Frequency Provider Last Rate Last Admin   denosumab (PROLIA) injection 60 mg  60 mg Subcutaneous Q6 months Milinda Antis F, MD   60 mg at 03/27/22 1042   Allergies  Allergen Reactions   Sulfa Antibiotics    Social History   Socioeconomic History   Marital status: Widowed    Spouse name: Not on file   Number of children: Not on file   Years of education:  Not on file   Highest education level: Not on file  Occupational History   Not on file  Tobacco Use   Smoking status: Never   Smokeless tobacco: Never  Vaping Use   Vaping Use: Never used  Substance and Sexual Activity   Alcohol use: Never   Drug use: Never   Sexual activity: Not Currently  Other Topics Concern   Not on file  Social History Narrative   Lives with [name] and son in law.    Social Determinants of Health   Financial Resource Strain: Low Risk  (04/20/2021)   Overall Financial Resource Strain (CARDIA)    Difficulty of Paying Living Expenses: Not hard at all  Food Insecurity: No Food Insecurity (04/20/2021)   Hunger Vital Sign    Worried About Running Out of Food in the Last Year: Never true    Ran Out of Food in the Last Year:  Never true  Transportation Needs: No Transportation Needs (04/20/2021)   PRAPARE - Administrator, Civil Service (Medical): No    Lack of Transportation (Non-Medical): No  Physical Activity: Insufficiently Active (04/20/2021)   Exercise Vital Sign    Days of Exercise per Week: 4 days    Minutes of Exercise per Session: 10 min  Stress: No Stress Concern Present (04/20/2021)   [employer] of Occupational Health - Occupational Stress Questionnaire    Feeling of Stress : Not at all  Social Connections: Moderately Integrated (04/20/2021)   Social Connection and Isolation Panel [NHANES]    Frequency of Communication with Friends and Family: Twice a week    Frequency of Social Gatherings with Friends and Family: More than three times a week    Attends Religious Services: 1 to 4 times per year    Active Member of [employer] or Organizations: Yes    Attends Banker Meetings: 1 to 4 times per year    Marital Status: Widowed  Intimate Partner Violence: Not At Risk (04/20/2021)   Humiliation, Afraid, Rape, and Kick questionnaire    Fear of Current or Ex-Partner: No    Emotionally Abused: No    Physically Abused: No    Sexually Abused: No     Review of Systems  All other systems reviewed and are negative.      Objective:   Physical Exam Vitals reviewed.  Constitutional:      General: She is not in acute distress.    Appearance: Normal appearance. She is normal weight. She is not toxic-appearing.  Neck:     Vascular: No carotid bruit.  Cardiovascular:     Rate and Rhythm: Normal rate and regular rhythm.     Heart sounds: Murmur heard.     No friction rub. No gallop.  Pulmonary:     Effort: Pulmonary effort is normal. No respiratory distress.     Breath sounds: Normal breath sounds. No stridor. No wheezing or rales.  Abdominal:     General: Abdomen is flat. Bowel sounds are normal. There is no distension.     Palpations: Abdomen is soft.     Tenderness:  There is no abdominal tenderness. There is no guarding or rebound.  Musculoskeletal:     Right lower leg: No edema.     Left lower leg: No edema.  Lymphadenopathy:     Cervical: No cervical adenopathy.  Neurological:     General: No focal deficit present.     Mental Status: She is alert.     Cranial  Nerves: No cranial nerve deficit.     Motor: No weakness.  Psychiatric:        Mood and Affect: Affect is flat.        Cognition and Memory: Cognition is impaired. Memory is impaired. She exhibits impaired recent memory.          Assessment & Plan:   Primary hypertension - Plan: CBC with Differential/Platelet, COMPLETE METABOLIC PANEL WITH GFR, TSH  Acquired hypothyroidism - Plan: CBC with Differential/Platelet, COMPLETE METABOLIC PANEL WITH GFR, TSH  Vascular dementia without behavioral disturbance (HCC)  Stage 3 chronic kidney disease, unspecified whether stage 3a or 3b CKD (HCC) - Plan: CBC with Differential/Platelet, COMPLETE METABOLIC PANEL WITH GFR, TSH Blood pressure today is low.  She is already off all of her blood pressure medication.  I will check a CBC a CMP and a TSH.  I encouraged a protein rich diet to try to increase muscle strength to help prevent falls.  Also discussed exercises that he can do at home to try to strengthen the muscles of her legs to prevent falls.  We discussed fall prevention.  Patient may require placement due to the progression of her dementia.  If that happens, I will assist in any way I can

## 2022-04-05 LAB — COMPLETE METABOLIC PANEL WITH GFR
AG Ratio: 1.1 (calc) (ref 1.0–2.5)
ALT: 16 U/L (ref 6–29)
AST: 26 U/L (ref 10–35)
Albumin: 3.7 g/dL (ref 3.6–5.1)
Alkaline phosphatase (APISO): 67 U/L (ref 37–153)
BUN/Creatinine Ratio: 22 (calc) (ref 6–22)
BUN: 27 mg/dL — ABNORMAL HIGH (ref 7–25)
CO2: 23 mmol/L (ref 20–32)
Calcium: 9.2 mg/dL (ref 8.6–10.4)
Chloride: 105 mmol/L (ref 98–110)
Creat: 1.23 mg/dL — ABNORMAL HIGH (ref 0.60–0.95)
Globulin: 3.5 g/dL (calc) (ref 1.9–3.7)
Glucose, Bld: 85 mg/dL (ref 65–99)
Potassium: 4.6 mmol/L (ref 3.5–5.3)
Sodium: 138 mmol/L (ref 135–146)
Total Bilirubin: 0.4 mg/dL (ref 0.2–1.2)
Total Protein: 7.2 g/dL (ref 6.1–8.1)
eGFR: 41 mL/min/{1.73_m2} — ABNORMAL LOW (ref >=60)

## 2022-04-05 LAB — CBC WITH DIFFERENTIAL/PLATELET
Absolute Monocytes: 735 cells/uL (ref 200–950)
Basophils Absolute: 42 cells/uL (ref 0–200)
Basophils Relative: 0.6 %
Eosinophils Absolute: 98 cells/uL (ref 15–500)
Eosinophils Relative: 1.4 %
HCT: 39.3 % (ref 35.0–45.0)
Hemoglobin: 13 g/dL (ref 11.7–15.5)
Lymphs Abs: 1995 cells/uL (ref 850–3900)
MCH: 27.6 pg (ref 27.0–33.0)
MCHC: 33.1 g/dL (ref 32.0–36.0)
MCV: 83.4 fL (ref 80.0–100.0)
MPV: 11.3 fL (ref 7.5–12.5)
Monocytes Relative: 10.5 %
Neutro Abs: 4130 cells/uL (ref 1500–7800)
Neutrophils Relative %: 59 %
Platelets: 222 10*3/uL (ref 140–400)
RBC: 4.71 10*6/uL (ref 3.80–5.10)
RDW: 13.3 % (ref 11.0–15.0)
Total Lymphocyte: 28.5 %
WBC: 7 10*3/uL (ref 3.8–10.8)

## 2022-04-05 LAB — TSH: TSH: 2.33 mIU/L (ref 0.40–4.50)

## 2022-04-30 ENCOUNTER — Encounter: Payer: Self-pay | Admitting: Internal Medicine

## 2022-05-02 ENCOUNTER — Other Ambulatory Visit: Payer: Self-pay | Admitting: Family Medicine

## 2022-05-03 ENCOUNTER — Ambulatory Visit (INDEPENDENT_AMBULATORY_CARE_PROVIDER_SITE_OTHER): Payer: Medicare Other

## 2022-05-03 DIAGNOSIS — Z0000 Encounter for general adult medical examination without abnormal findings: Secondary | ICD-10-CM

## 2022-05-03 NOTE — Progress Notes (Signed)
Subjective:   Adesuwa Osgood is a 89 y.o. female who presents for Medicare Annual (Subsequent) preventive examination. Virtual Visit via Telephone Note  I connected with [name] on 05/03/22 at  8:30 AM EST by telephone and verified that I am speaking with the correct person using two identifiers.  Location: Patient: home Provider: clinic office   I discussed the limitations, risks, security and privacy concerns of performing an evaluation and management service by telephone and the availability of in person appointments. I also discussed with the patient that there may be a patient responsible charge related to this service. The patient expressed understanding and agreed to proceed.   History of Present Illness:    Observations/Objective:   Assessment and Plan:   Follow Up Instructions:    I discussed the assessment and treatment plan with the patient. The patient was provided an opportunity to ask questions and all were answered. The patient agreed with the plan and demonstrated an understanding of the instructions.   The patient was advised to call back or seek an in-person evaluation if the symptoms worsen or if the condition fails to improve as anticipated.  I provided 30 minutes of non-face-to-face time during this encounter.   Arta Silence, CMA  Review of Systems     Cardiac Risk Factors include: advanced age (>107men, >57 women)     Objective:    There were no vitals filed for this visit. There is no height or weight on file to calculate BMI.     05/03/2022    8:34 AM 09/17/2021    8:53 AM 09/17/2021    4:41 AM 04/20/2021   12:15 PM  Advanced Directives  Does Patient Have a Medical Advance Directive? Yes Yes No Yes  Type of Estate agent of [place];Living will Healthcare Power of [employer] of [place];Living will  Does patient want to make changes to medical advance directive?  No - Patient declined    Copy of  Healthcare Power of Attorney in Chart?  No - copy requested  Yes - validated most recent copy scanned in chart (See row information)  Would patient like information on creating a medical advance directive?  No - Patient declined No - Patient declined     Current Medications (verified) Outpatient Encounter Medications as of 05/03/2022  Medication Sig   calcium carbonate (OS-CAL - DOSED IN MG OF ELEMENTAL CALCIUM) 1250 (500 Ca) MG tablet Take 1 tablet by mouth.   calcium carbonate (TUMS - DOSED IN MG ELEMENTAL CALCIUM) 500 MG chewable tablet Chew 1 tablet by mouth. 1-2 meals per day   Cholecalciferol (VITAMIN D3) 25 MCG (1000 UT) CAPS Take 1-2 capsules (1,000-2,000 Units total) by mouth 2 (two) times daily. 1 tablet in morning and 2 tablets in evening   donepezil (ARICEPT) 5 MG tablet TAKE 1 TABLET BY MOUTH AT BEDTIME   levothyroxine (SYNTHROID) 75 MCG tablet TAKE 1 TABLET BY MOUTH BEFORE BREAKFAST   Multiple Minerals-Vitamins (CALCIUM-MAGNESIUM-ZINC-D3 PO) Take 1-2 tablets by mouth daily.   simvastatin (ZOCOR) 20 MG tablet Take 1 tablet (20 mg total) by mouth at bedtime.   Wheat Dextrin (BENEFIBER) CHEW Chew 1 tablet by mouth daily.   Facility-Administered Encounter Medications as of 05/03/2022  Medication   denosumab (PROLIA) injection 60 mg    Allergies (verified) Sulfa antibiotics   History: Past Medical History:  Diagnosis Date   Dementia (HCC)    Diverticulosis    Hyperlipidemia    Hypertension  Osteoporosis    Oth fracture of shaft of right humerus, init for opn fx 1990's   Renal insufficiency    Thyroid disease    Past Surgical History:  Procedure Laterality Date   ABDOMINAL HYSTERECTOMY  1972   APPENDECTOMY     cataract surgery     Early 2000's both eyes    CHOLECYSTECTOMY  1980's   COLONOSCOPY  03/10/2006   [place]; severe diverticulosis especially involving the sigmoid and to a lesser extent all other segments as well.  There was no evidence of neoplastic  pathology. Recommended continuing Metamucil as well as Questran which appeared to keep her fairly regular. Consider repeat in 5 years.   OVARIAN CYST REMOVAL  09/2012   TONSILLECTOMY  age 70   Family History  Problem Relation Age of Onset   Colon cancer Neg Hx    Social History   Socioeconomic History   Marital status: Widowed    Spouse name: Not on file   Number of children: Not on file   Years of education: Not on file   Highest education level: Not on file  Occupational History   Not on file  Tobacco Use   Smoking status: Never   Smokeless tobacco: Never  Vaping Use   Vaping Use: Never used  Substance and Sexual Activity   Alcohol use: Never   Drug use: Never   Sexual activity: Not Currently  Other Topics Concern   Not on file  Social History Narrative   Lives with [name] and son in Social worker.    Social Determinants of Health   Financial Resource Strain: Low Risk  (05/03/2022)   Overall Financial Resource Strain (CARDIA)    Difficulty of Paying Living Expenses: Not hard at all  Food Insecurity: No Food Insecurity (05/03/2022)   Hunger Vital Sign    Worried About Running Out of Food in the Last Year: Never true    Ran Out of Food in the Last Year: Never true  Transportation Needs: No Transportation Needs (05/03/2022)   PRAPARE - Administrator, Civil Service (Medical): No    Lack of Transportation (Non-Medical): No  Physical Activity: Insufficiently Active (05/03/2022)   Exercise Vital Sign    Days of Exercise per Week: 3 days    Minutes of Exercise per Session: 10 min  Stress: No Stress Concern Present (05/03/2022)   [employer] of Occupational Health - Occupational Stress Questionnaire    Feeling of Stress : Only a little  Social Connections: Moderately Isolated (05/03/2022)   Social Connection and Isolation Panel [NHANES]    Frequency of Communication with Friends and Family: Twice a week    Frequency of Social Gatherings with Friends and  Family: Twice a week    Attends Religious Services: 1 to 4 times per year    Active Member of [employer] or Organizations: No    Attends Banker Meetings: Never    Marital Status: Widowed    Tobacco Counseling Counseling given: Not Answered   Clinical Intake:  Pre-visit preparation completed: Yes  Pain : No/denies pain     BMI - recorded: 19.85 Diabetes: No  How often do you need to have someone help you when you read instructions, pamphlets, or other written materials from your doctor or pharmacy?: 5 - Always What is the last grade level you completed in school?: 8th grade  Diabetic?n/a  Interpreter Needed?: No      Activities of Daily Living    05/03/2022  8:42 AM 09/17/2021    8:58 AM  In your present state of health, do you have any difficulty performing the following activities:  Hearing? 1   Comment both ears   Vision? 1   Comment L-eye   Difficulty concentrating or making decisions? 1   Walking or climbing stairs? 0   Dressing or bathing? 1   Comment daughter helps with   Doing errands, shopping? 0 0  Comment daughter helps   Preparing Food and eating ? N   Comment daughter helps   Using the Toilet? N   Comment daughter helps   In the past six months, have you accidently leaked urine? N   Do you have problems with loss of bowel control? N   Comment daughter helps   Managing your Medications? N   Comment daughter helps   Managing your Finances? N   Comment daughter helps   Housekeeping or managing your Housekeeping? N   Comment daughter helps     Patient Care Team: Donita Brooks, MD as PCP - General (Family Medicine) Lanelle Bal, DO as Consulting Physician (Gastroenterology)  Indicate any recent Medical Services you may have received from other than [facility] providers in the past year (date may be approximate).     Assessment:   This is a routine wellness examination for Deshea.  Hearing/Vision screen No results  found.  Dietary issues and exercise activities discussed: Exercise limited by: Other - see comments;cardiac condition(s);respiratory conditions(s) (per daughter due to age)   Goals Addressed             This Visit's Progress    Exercise 3x per week (30 min per time)   On track    Continue to maintain good general wellness.       Depression Screen    05/03/2022    8:39 AM 01/04/2022    2:34 PM 10/02/2021    4:02 PM 04/20/2021   12:11 PM 10/13/2020   10:26 AM 04/17/2020    5:29 PM 01/05/2020    3:39 PM  PHQ 2/9 Scores  PHQ - 2 Score 1 2 0 0 0 0   PHQ- 9 Score  5       Exception Documentation       Medical reason    Fall Risk    05/03/2022    8:34 AM 05/03/2022    8:23 AM 03/19/2022   10:20 AM 10/02/2021    4:02 PM 04/20/2021   12:15 PM  Fall Risk   Falls in the past year? 0 0 1 0 0  Number falls in past yr:     0  Injury with Fall?     0  Risk for fall due to :     Impaired balance/gait;Mental status change;History of fall(s)  Follow up    Falls evaluation completed Falls prevention discussed    FALL RISK PREVENTION PERTAINING TO THE HOME:  Any stairs in or around the home? Yes  If so, are there any without handrails? Yes  Home free of loose throw rugs in walkways, pet beds, electrical cords, etc? Yes  Adequate lighting in your home to reduce risk of falls? Yes   ASSISTIVE DEVICES UTILIZED TO PREVENT FALLS:  Life alert?  N/a Use of a cane, walker or w/c? Yes  Grab bars in the bathroom?  N/a Shower chair or bench in shower?  N/a Elevated toilet seat or a handicapped toilet?  N/a  TIMED UP AND GO:  Was the test  performed? No .  Length of time to ambulate 10 feet: n/a sec.     Cognitive Function:        05/03/2022    8:48 AM  6CIT Screen  What Year? 0 points  What month? 0 points  What time? 0 points  Count back from 20 0 points  Months in reverse 0 points  Repeat phrase 0 points  Total Score 0 points    Immunizations Immunization History   Administered Date(s) Administered   Fluad Quad(high Dose 65+) 01/05/2020, 03/30/2021   Influenza,inj,Quad PF,6+ Mos 02/08/2019, 01/04/2022   Influenza-Unspecified 12/23/2016   Pneumococcal Conjugate-13 12/23/2016   Pneumococcal Polysaccharide-23 04/15/2020   Zoster, Live 01/20/2017    TDAP status: Up to date  Flu Vaccine status: Up to date  Pneumococcal vaccine status: Up to date  Covid-19 vaccine status: Declined, Education has been provided regarding the importance of this vaccine but patient still declined. Advised may receive this vaccine at local pharmacy or Health Dept.or vaccine clinic. Aware to provide a copy of the vaccination record if obtained from local pharmacy or Health Dept. Verbalized acceptance and understanding.  Qualifies for Shingles Vaccine? No   Zostavax completed No   Shingrix Completed?: No.    Education has been provided regarding the importance of this vaccine. Patient has been advised to call insurance company to determine out of pocket expense if they have not yet received this vaccine. Advised may also receive vaccine at local pharmacy or Health Dept. Verbalized acceptance and understanding.  Screening Tests Health Maintenance  Topic Date Due   COVID-19 Vaccine (1) Never done   DTaP/Tdap/Td (1 - Tdap) Never done   Zoster Vaccines- Shingrix (1 of 2) Never done   Medicare Annual Wellness (AWV)  05/04/2023   Pneumonia Vaccine 50+ Years old  Completed   INFLUENZA VACCINE  Completed   DEXA SCAN  Completed   HPV VACCINES  Aged Out    Health Maintenance  Health Maintenance Due  Topic Date Due   COVID-19 Vaccine (1) Never done   DTaP/Tdap/Td (1 - Tdap) Never done   Zoster Vaccines- Shingrix (1 of 2) Never done    Colorectal cancer screening: No longer required.   Mammogram status: No longer required due to n/a.  Bone Density status: Completed 10/218. Results reflect: Bone density results: OSTEOPOROSIS. Repeat every 1 years.  Lung Cancer  Screening: (Low Dose CT Chest recommended if Age 20-80 years, 30 pack-year currently smoking OR have quit w/in 15years.) does not qualify.   Lung Cancer Screening Referral: n/a  Additional Screening:  Hepatitis C Screening: does not qualify; Completed n/a  Vision Screening: Recommended annual ophthalmology exams for early detection of glaucoma and other disorders of the eye. Is the patient up to date with their annual eye exam?  Yes  Who is the provider or what is the name of the office in which the patient attends annual eye exams? Dr. Gershon Crane If pt is not established with a provider, would they like to be referred to a provider to establish care? No .   Dental Screening: Recommended annual dental exams for proper oral hygiene  Community Resource Referral / Chronic Care Management: CRR required this visit?  No   CCM required this visit?  No      Plan:     I have personally reviewed and noted the following in the patient's chart:   Medical and social history Use of alcohol, tobacco or illicit drugs  Current medications and supplements including opioid prescriptions.  Patient is currently taking opioid prescriptions. Information provided to patient regarding non-opioid alternatives. Patient advised to discuss non-opioid treatment plan with their provider. Functional ability and status Nutritional status Physical activity Advanced directives List of other physicians Hospitalizations, surgeries, and ER visits in previous 12 months Vitals Screenings to include cognitive, depression, and falls Referrals and appointments  In addition, I have reviewed and discussed with patient certain preventive protocols, quality metrics, and best practice recommendations. A written personalized care plan for preventive services as well as general preventive health recommendations were provided to patient.     Colman Cater, [place]   05/03/2022   Nurse Notes: This visit was done virtually w/the  assistance with pt's daughter, Arbie Cookey.

## 2022-06-28 ENCOUNTER — Ambulatory Visit
Admission: EM | Admit: 2022-06-28 | Discharge: 2022-06-28 | Disposition: A | Discharge status: Home / Self Care | Payer: Medicare Other | Attending: Internal Medicine | Admitting: Internal Medicine

## 2022-06-28 ENCOUNTER — Encounter: Payer: Self-pay | Admitting: Family Medicine

## 2022-06-28 DIAGNOSIS — N3001 Acute cystitis with hematuria: Secondary | ICD-10-CM | POA: Diagnosis present

## 2022-06-28 LAB — POCT URINALYSIS DIP (MANUAL ENTRY)
Bilirubin, UA: NEGATIVE
Glucose, UA: NEGATIVE mg/dL
Ketones, POC UA: NEGATIVE mg/dL
Nitrite, UA: POSITIVE — AB
Protein Ur, POC: NEGATIVE mg/dL
Spec Grav, UA: 1.015 (ref 1.010–1.025)
Urobilinogen, UA: 0.2 E.U./dL
pH, UA: 6 (ref 5.0–8.0)

## 2022-06-28 MED ORDER — AMOXICILLIN-POT CLAVULANATE 500-125 MG PO TABS: 1.0000 | ORAL_TABLET | Freq: Three times a day (TID) | ORAL | 0 refills | Status: DC

## 2022-06-28 NOTE — ED Provider Notes (Signed)
[facility]    CSN: [id number] Arrival date & time: 06/28/22  1238      History   Chief Complaint Chief Complaint  Patient presents with   Fatigue    Possible UTI . Or dehydration. Confusion. OCP advised urgent care - Entered by patient    HPI Dawn Conner is a 89 y.o. female.   HPI  She is in today with her daughter for changes in her mental status. She has a history of dementia however the level of confusion has increased. She reports that she did have an episode of diarrhea. She reports that there is a hard thing to control; constipation/diarrhea. She has not had a recent UTI . She has a decreased fluid intake but she encourages her with different drinks. Denies fever, chills, headache, dizziness, visual changes, shortness of breath, dyspnea on exertion, chest pain, nausea, vomiting, any edema.   Past Medical History:  Diagnosis Date   Dementia ([place])    Diverticulosis    Hyperlipidemia    Hypertension    Osteoporosis    Oth fracture of shaft of right humerus, init for opn fx 1990's   Renal insufficiency    Thyroid disease     Patient Active Problem List   Diagnosis Date Noted   Fall 03/19/2022   Perianal irritation 12/20/2021   Prediabetes 09/18/2021   SIRS (systemic inflammatory response syndrome) ([place]) 09/17/2021   Failure to thrive in adult 09/17/2021   Fever 09/17/2021   Leukocytosis 09/17/2021   Generalized weakness 09/17/2021   Chronic confusional state 09/17/2021   Hyperglycemia 09/17/2021   Alternating constipation and diarrhea 01/22/2021   Vascular dementia ([place]) 10/17/2019   Diarrhea 05/19/2019   Essential hypertension 11/09/2018   Osteoporosis 11/09/2018   Hypothyroidism 11/09/2018   Hyperlipidemia 11/09/2018   Chronic diarrhea 11/09/2018    Past Surgical History:  Procedure Laterality Date   ABDOMINAL HYSTERECTOMY  1972   APPENDECTOMY     cataract surgery     Early 2000's both eyes    CHOLECYSTECTOMY  1980's   COLONOSCOPY   03/10/2006   [place]; severe diverticulosis especially involving the sigmoid and to a lesser extent all other segments as well.  There was no evidence of neoplastic pathology. Recommended continuing Metamucil as well as Questran which appeared to keep her fairly regular. Consider repeat in 5 years.   OVARIAN CYST REMOVAL  09/2012   TONSILLECTOMY  age 70    OB History   No obstetric history on file.      Home Medications    Prior to Admission medications   Medication Sig Start Date End Date Taking? Authorizing Provider  amoxicillin-clavulanate (AUGMENTIN) 500-125 MG tablet Take 1 tablet by mouth every 8 (eight) hours. 06/28/22  Yes Even Budlong, Diona Foley, NP  calcium carbonate (OS-CAL - DOSED IN MG OF ELEMENTAL CALCIUM) 1250 (500 Ca) MG tablet Take 1 tablet by mouth.    [provider]  calcium carbonate (TUMS - DOSED IN MG ELEMENTAL CALCIUM) 500 MG chewable tablet Chew 1 tablet by mouth. 1-2 meals per day    [provider]  Cholecalciferol (VITAMIN D3) 25 MCG (1000 UT) CAPS Take 1-2 capsules (1,000-2,000 Units total) by mouth 2 (two) times daily. 1 tablet in morning and 2 tablets in evening 09/18/21   Johnson, Clanford L, MD  donepezil (ARICEPT) 5 MG tablet TAKE 1 TABLET BY MOUTH AT BEDTIME 05/02/22   Susy Frizzle, MD  levothyroxine (SYNTHROID) 75 MCG tablet TAKE 1 TABLET BY MOUTH BEFORE BREAKFAST 01/04/22  Susy Frizzle, MD  Multiple Minerals-Vitamins (CALCIUM-MAGNESIUM-ZINC-D3 PO) Take 1-2 tablets by mouth daily.    [provider]  simvastatin (ZOCOR) 20 MG tablet Take 1 tablet (20 mg total) by mouth at bedtime. 08/02/21   Susy Frizzle, MD  Wheat Dextrin (BENEFIBER) CHEW Chew 1 tablet by mouth daily.    [provider]    Family History Family History  Problem Relation Age of Onset   Colon cancer Neg Hx     Social History Social History   Tobacco Use   Smoking status: Never   Smokeless tobacco: Never  Vaping Use   Vaping Use:  Never used  Substance Use Topics   Alcohol use: Never   Drug use: Never     Allergies   Sulfa antibiotics   Review of Systems Review of Systems   Physical Exam Triage Vital Signs ED Triage Vitals  Enc Vitals Group     BP 06/28/22 1249 (!) 177/77     Pulse Rate 06/28/22 1249 73     Resp 06/28/22 1249 18     Temp 06/28/22 1249 (!) 97.5 F (36.4 C)     Temp Source 06/28/22 1244 Oral     SpO2 06/28/22 1249 98 %     Weight --      Height --      Head Circumference --      Peak Flow --      Pain Score 06/28/22 1247 0     Pain Loc --      Pain Edu? --      Excl. in [place]? --    No data found.  Updated Vital Signs BP (!) 177/77 (BP Location: Right Arm)   Pulse 73   Temp (!) 97.5 F (36.4 C) (Oral)   Resp 18   SpO2 98%   Visual Acuity Right Eye Distance:   Left Eye Distance:   Bilateral Distance:    Right Eye Near:   Left Eye Near:    Bilateral Near:     Physical Exam Constitutional:      General: She is not in acute distress.    Appearance: She is normal weight.  HENT:     Head: Normocephalic.     Nose: Nose normal.     Mouth/Throat:     Mouth: Mucous membranes are moist.  Cardiovascular:     Rate and Rhythm: Normal rate and regular rhythm.     Pulses: Normal pulses.  Pulmonary:     Effort: Pulmonary effort is normal.     Breath sounds: Normal breath sounds.  Abdominal:     General: There is no distension.     Palpations: Abdomen is soft. There is no mass.     Tenderness: There is no abdominal tenderness.  Musculoskeletal:        General: Normal range of motion.     Cervical back: Normal range of motion.  Skin:    General: Skin is warm and dry.     Capillary Refill: Capillary refill takes less than 2 seconds.  Neurological:     Mental Status: She is alert. Mental status is at baseline.      UC Treatments / Results  Labs (all labs ordered are listed, but only abnormal results are displayed) Labs Reviewed  POCT URINALYSIS DIP (MANUAL ENTRY)  - Abnormal; Notable for the following components:      Result Value   Clarity, UA cloudy (*)    Blood, UA trace-intact (*)  Nitrite, UA Positive (*)    Leukocytes, UA Small (1+) (*)    All other components within normal limits  URINE CULTURE    EKG   Radiology No results found.  Procedures Procedures (including critical care time)  Medications Ordered in UC Medications - No data to display  Initial Impression / Assessment and Plan / UC Course  I have reviewed the triage vital signs and the nursing notes.  Pertinent labs & imaging results that were available during my care of the patient were reviewed by me and considered in my medical decision making (see chart for details).     fatigue Final Clinical Impressions(s) / UC Diagnoses   Final diagnoses:  Acute cystitis with hematuria     Discharge Instructions      Your urinalysis is positive for bacteria. Urine culture pending this confirms the type of bacteria and if the treatment is effective. Due to your symptoms and history you have been started on a treatment of Augementin 500 mg  three times a day for 7 days. Must be taken with food to avoid GI sym[toms Encourage completion of your treatment even when symptoms improve.  Encourage increasing hydration with water Add cranberry juice 100% 8 -16 ozs daily until symptoms improve Discussed hygiene and voiding when the urge presents      ED Prescriptions     Medication Sig Dispense Auth. Provider   amoxicillin-clavulanate (AUGMENTIN) 500-125 MG tablet Take 1 tablet by mouth every 8 (eight) hours. 21 tablet Vevelyn Francois, NP      PDMP not reviewed this encounter.   Dionisio David [place], [place] 06/28/22 [phone]

## 2022-06-28 NOTE — Discharge Instructions (Addendum)
Your urinalysis is positive for bacteria. Urine culture pending this confirms the type of bacteria and if the treatment is effective. Due to your symptoms and history you have been started on a treatment of Augementin 500 mg  three times a day for 7 days. Must be taken with food to avoid GI sym[toms Encourage completion of your treatment even when symptoms improve.  Encourage increasing hydration with water Add cranberry juice 100% 8 -16 ozs daily until symptoms improve Discussed hygiene and voiding when the urge presents

## 2022-06-28 NOTE — ED Triage Notes (Signed)
Per daughter, pt was weak, unsteady on her feet, dizzy, fatigue, and more confused than usual x 1 day. She states she didn't recognize where she was was. Pt had some diarrhea on Tuesday. Daughter believes she may be dehydrated.

## 2022-07-01 ENCOUNTER — Telehealth (HOSPITAL_COMMUNITY): Payer: Self-pay | Admitting: Emergency Medicine

## 2022-07-01 LAB — URINE CULTURE: Culture: >=100000 — AB

## 2022-07-01 MED ORDER — CEPHALEXIN 500 MG PO CAPS: 500.0000 mg | ORAL_CAPSULE | Freq: Two times a day (BID) | ORAL | 0 refills | Status: AC

## 2022-07-05 NOTE — Progress Notes (Unsigned)
Referring Provider: Susy Frizzle, MD Primary Care Physician:  Susy Frizzle, MD Primary GI Physician: Dr. Abbey Chatters  No chief complaint on file.   HPI:   Dawn Conner is a 89 y.o. female with history of dementia, hypothyroidism, HTN, HLD, osteoporosis, and chronic history of intermittent diarrhea likely secondary to lactose intolerance and bile salt diarrhea s/p cholecystectomy, previously well controlled with tums before meals and a lactose free diet.  Previously, TSH within normal limits on medication and celiac screen negative.  Developed intermittent constipation with associated left-sided abdominal discomfort with abdominal x ray in December 2022 revealing mild fecal loading in the region of the rectum and sigmoid colon. Noted improvement with benefiber. She is presenting today for follow-up.   Last seen in our office 12/20/2021.  Daughter provided history due to worsening dementia.  Reported bowels are moving fairly well, soft/mushy, typically with 1 BM daily.  Has a good bowel movement, other times passes only small amount.  Occasional abdominal discomfort, but nothing routine, and this usually precedes passing a large amount of stool.  She was still taking Tums before meals and following a lactose-free diet.  She was trying to take Benefiber tablets, but daughter was having trouble getting patient to take this.  Unable to chew Gummies very well.  Noted some redness on her bottom from not cleaning well while daughter was at work.  She was using A&E ointment which seem to be helping some.  On exam, patient had perirectal abscess with maceration and a couple small excoriations.  Recommended Benefiber powder, can back off on Tums to allow for more productive bowel movements.  If diarrhea returns, resume Tums.  Advised that they can try zinc oxide as a protective barrier in the perianal area.  Today:   Past Medical History:  Diagnosis Date   Dementia ([place])    Diverticulosis     Hyperlipidemia    Hypertension    Osteoporosis    Oth fracture of shaft of right humerus, init for opn fx 1990's   Renal insufficiency    Thyroid disease     Past Surgical History:  Procedure Laterality Date   ABDOMINAL HYSTERECTOMY  1972   APPENDECTOMY     cataract surgery     Early 2000's both eyes    CHOLECYSTECTOMY  1980's   COLONOSCOPY  03/10/2006   [place]; severe diverticulosis especially involving the sigmoid and to a lesser extent all other segments as well.  There was no evidence of neoplastic pathology. Recommended continuing Metamucil as well as Questran which appeared to keep her fairly regular. Consider repeat in 5 years.   OVARIAN CYST REMOVAL  09/2012   TONSILLECTOMY  age 14    Current Outpatient Medications  Medication Sig Dispense Refill   calcium carbonate (OS-CAL - DOSED IN MG OF ELEMENTAL CALCIUM) 1250 (500 Ca) MG tablet Take 1 tablet by mouth.     calcium carbonate (TUMS - DOSED IN MG ELEMENTAL CALCIUM) 500 MG chewable tablet Chew 1 tablet by mouth. 1-2 meals per day     cephALEXin (KEFLEX) 500 MG capsule Take 1 capsule (500 mg total) by mouth 2 (two) times daily for 5 days. 10 capsule 0   Cholecalciferol (VITAMIN D3) 25 MCG (1000 UT) CAPS Take 1-2 capsules (1,000-2,000 Units total) by mouth 2 (two) times daily. 1 tablet in morning and 2 tablets in evening     donepezil (ARICEPT) 5 MG tablet TAKE 1 TABLET BY MOUTH AT BEDTIME 90 tablet 0  levothyroxine (SYNTHROID) 75 MCG tablet TAKE 1 TABLET BY MOUTH BEFORE BREAKFAST 90 tablet 3   Multiple Minerals-Vitamins (CALCIUM-MAGNESIUM-ZINC-D3 PO) Take 1-2 tablets by mouth daily.     simvastatin (ZOCOR) 20 MG tablet Take 1 tablet (20 mg total) by mouth at bedtime. 90 tablet 3   Wheat Dextrin (BENEFIBER) CHEW Chew 1 tablet by mouth daily.     Current Facility-Administered Medications  Medication Dose Route Frequency Provider Last Rate Last Admin   denosumab (PROLIA) injection 60 mg  60 mg Subcutaneous Q6 months  Vic Blackbird F, MD   60 mg at 03/27/22 1042    Allergies as of 07/08/2022 - Review Complete 06/28/2022  Allergen Reaction Noted   Sulfa antibiotics  11/09/2018    Family History  Problem Relation Age of Onset   Colon cancer Neg Hx     Social History   Socioeconomic History   Marital status: Widowed    Spouse name: Not on file   Number of children: Not on file   Years of education: Not on file   Highest education level: Not on file  Occupational History   Not on file  Tobacco Use   Smoking status: Never   Smokeless tobacco: Never  Vaping Use   Vaping Use: Never used  Substance and Sexual Activity   Alcohol use: Never   Drug use: Never   Sexual activity: Not Currently  Other Topics Concern   Not on file  Social History Narrative   Lives with daughter Arbie Cookey and son in Sports coach.    Social Determinants of Health   Financial Resource Strain: Low Risk  (05/03/2022)   Overall Financial Resource Strain (CARDIA)    Difficulty of Paying Living Expenses: Not hard at all  Food Insecurity: No Food Insecurity (05/03/2022)   Hunger Vital Sign    Worried About Running Out of Food in the Last Year: Never true    Ran Out of Food in the Last Year: Never true  Transportation Needs: No Transportation Needs (05/03/2022)   PRAPARE - Hydrologist (Medical): No    Lack of Transportation (Non-Medical): No  Physical Activity: Insufficiently Active (05/03/2022)   Exercise Vital Sign    Days of Exercise per Week: 3 days    Minutes of Exercise per Session: 10 min  Stress: No Stress Concern Present (05/03/2022)   [place]    Feeling of Stress : Only a little  Social Connections: Moderately Isolated (05/03/2022)   Social Connection and Isolation Panel [NHANES]    Frequency of Communication with Friends and Family: Twice a week    Frequency of Social Gatherings with Friends and Family: Twice a week     Attends Religious Services: 1 to 4 times per year    Active Member of [employer] or Organizations: No    Attends Archivist Meetings: Never    Marital Status: Widowed    Review of Systems: Gen: Denies fever, chills, anorexia. Denies fatigue, weakness, weight loss.  CV: Denies chest pain, palpitations, syncope, peripheral edema, and claudication. Resp: Denies dyspnea at rest, cough, wheezing, coughing up blood, and pleurisy. GI: Denies vomiting blood, jaundice, and fecal incontinence.   Denies dysphagia or odynophagia. Derm: Denies rash, itching, dry skin Psych: Denies depression, anxiety, memory loss, confusion. No homicidal or suicidal ideation.  Heme: Denies bruising, bleeding, and enlarged lymph nodes.  Physical Exam: There were no vitals taken for this visit. General:   Alert and oriented.  No distress noted. Pleasant and cooperative.  Head:  Normocephalic and atraumatic. Eyes:  Conjuctiva clear without scleral icterus. Heart:  S1, S2 present without murmurs appreciated. Lungs:  Clear to auscultation bilaterally. No wheezes, rales, or rhonchi. No distress.  Abdomen:  +BS, soft, non-tender and non-distended. No rebound or guarding. No HSM or masses noted. Msk:  Symmetrical without gross deformities. Normal posture. Extremities:  Without edema. Neurologic:  Alert and  oriented x4 Psych:  Normal mood and affect.    Assessment:     Plan:  ***   Aliene Altes, PA-C [facility] 07/08/2022

## 2022-07-08 ENCOUNTER — Ambulatory Visit (INDEPENDENT_AMBULATORY_CARE_PROVIDER_SITE_OTHER): Payer: Medicare Other | Admitting: Gastroenterology

## 2022-07-08 ENCOUNTER — Encounter: Payer: Self-pay | Admitting: Gastroenterology

## 2022-07-08 VITALS — BP 139/84 | HR 75 | Temp 97.4°F | Ht 59.0 in | Wt 105.4 lb

## 2022-07-08 DIAGNOSIS — B359 Dermatophytosis, unspecified: Secondary | ICD-10-CM | POA: Diagnosis not present

## 2022-07-08 DIAGNOSIS — R198 Other specified symptoms and signs involving the digestive system and abdomen: Secondary | ICD-10-CM

## 2022-07-08 DIAGNOSIS — K625 Hemorrhage of anus and rectum: Secondary | ICD-10-CM

## 2022-07-08 MED ORDER — HYDROCORTISONE ACETATE 25 MG RE SUPP: 25.0000 mg | Freq: Two times a day (BID) | RECTAL | 1 refills | Status: DC

## 2022-07-08 NOTE — Patient Instructions (Addendum)
Have blood work completed at [facility].   Increase benefiber to 2 teaspoons daily x 2 weeks then increase to twice daily. This can be mixed in non-carbonated beverages or soft foods.   Continue tubs before meals and lactose free diet.   You can try starting a probiotic to see if this helps any further with bowel habits.  I have also sent hydrocortisone suppositories for possible internal hemorrhoids.  Use these twice daily for the next 7-10 days, then stop.  Monitor for recurrent rectal bleeding and let me know if this occurs.   You do have a fungal infection in the perianal area.  Use clotrimazole cream twice daily for the next 10 days. Be sure to keep the area clean and dry.  We will follow-up with you in 3 months or sooner if needed.  It was a pleasure to see you again today!   Aliene Altes, PA-C [facility]

## 2022-07-09 LAB — CBC WITH DIFFERENTIAL/PLATELET
Basophils Absolute: 0 10*3/uL (ref 0.0–0.2)
Basos: 0 %
EOS (ABSOLUTE): 0.1 10*3/uL (ref 0.0–0.4)
Eos: 1 %
Hematocrit: 43.5 % (ref 34.0–46.6)
Hemoglobin: 13.9 g/dL (ref 11.1–15.9)
Immature Grans (Abs): 0 10*3/uL (ref 0.0–0.1)
Immature Granulocytes: 0 %
Lymphocytes Absolute: 2.1 10*3/uL (ref 0.7–3.1)
Lymphs: 27 %
MCH: 27.6 pg (ref 26.6–33.0)
MCHC: 32 g/dL (ref 31.5–35.7)
MCV: 86 fL (ref 79–97)
Monocytes Absolute: 0.8 10*3/uL (ref 0.1–0.9)
Monocytes: 10 %
Neutrophils Absolute: 4.8 10*3/uL (ref 1.4–7.0)
Neutrophils: 62 %
Platelets: 218 10*3/uL (ref 150–450)
RBC: 5.04 x10E6/uL (ref 3.77–5.28)
RDW: 14.2 % (ref 11.7–15.4)
WBC: 7.7 10*3/uL (ref 3.4–10.8)

## 2022-08-10 ENCOUNTER — Other Ambulatory Visit: Payer: Self-pay | Admitting: Family Medicine

## 2022-08-16 ENCOUNTER — Other Ambulatory Visit: Payer: Self-pay | Admitting: Family Medicine

## 2022-08-16 ENCOUNTER — Other Ambulatory Visit: Payer: Self-pay

## 2022-08-16 DIAGNOSIS — E782 Mixed hyperlipidemia: Secondary | ICD-10-CM

## 2022-08-16 MED ORDER — SIMVASTATIN 20 MG PO TABS: 20.0000 mg | ORAL_TABLET | Freq: Every day | ORAL | 1 refills | Status: DC

## 2022-08-16 NOTE — Telephone Encounter (Signed)
Requested medications are due for refill today.  yes  Requested medications are on the active medications list.  yes  Last refill. 08/02/2021 90/3 rf  Future visit scheduled.   no  Notes to clinic.  Labs are expired.    Requested Prescriptions  Pending Prescriptions Disp Refills   simvastatin (ZOCOR) 20 MG tablet [Pharmacy Med Name: Simvastatin 20 MG Oral Tablet] 90 tablet 0    Sig: TAKE 1 TABLET BY MOUTH AT BEDTIME     Cardiovascular:  Antilipid - Statins Failed - 08/16/2022  1:27 PM      Failed - Valid encounter within last 12 months    Recent Outpatient Visits           1 year ago Primary hypertension   [facility] [place], Priscille Heidelberg, MD   1 year ago Primary hypertension   [facility] Tanya Nones, Priscille Heidelberg, MD   2 years ago Routine general medical examination at a health care facility   [facility] [place], Velna Hatchet, MD   2 years ago Traumatic hematoma of head, initial encounter   [facility] [place], Velna Hatchet, MD   2 years ago Localized osteoporosis without current pathological fracture   [facility] [place], Velna Hatchet, MD              Failed - Lipid Panel in normal range within the last 12 months    Cholesterol  Date Value Ref Range Status  04/09/2021 168 <200 mg/dL Final   LDL Cholesterol (Calc)  Date Value Ref Range Status  04/09/2021 92 mg/dL (calc) Final    Comment:    Reference range: <100 . Desirable range <100 mg/dL for primary prevention;   <70 mg/dL for patients with CHD or diabetic patients  with > or = 2 CHD risk factors. Marland Kitchen LDL-C is now calculated using the Martin-Hopkins  calculation, which is a validated novel method providing  better accuracy than the Friedewald equation in the  estimation of LDL-C.  Horald Pollen et al. Lenox Ahr. 1610;960(45): 2061-2068  (http://education.[facility].com/faq/FAQ164)    HDL  Date Value Ref Range Status  04/09/2021 56 > OR = 50  mg/dL Final   Triglycerides  Date Value Ref Range Status  04/09/2021 108 <150 mg/dL Final         Passed - Patient is not pregnant

## 2022-08-29 ENCOUNTER — Encounter: Payer: Self-pay | Admitting: Gastroenterology

## 2022-09-30 ENCOUNTER — Ambulatory Visit: Payer: Medicare Other

## 2022-09-30 ENCOUNTER — Ambulatory Visit (INDEPENDENT_AMBULATORY_CARE_PROVIDER_SITE_OTHER): Payer: Medicare Other

## 2022-09-30 VITALS — Ht 59.0 in | Wt 105.0 lb

## 2022-09-30 DIAGNOSIS — M810 Age-related osteoporosis without current pathological fracture: Secondary | ICD-10-CM | POA: Diagnosis not present

## 2022-09-30 DIAGNOSIS — M816 Localized osteoporosis [Lequesne]: Secondary | ICD-10-CM

## 2022-09-30 MED ORDER — DENOSUMAB 60 MG/ML ~~LOC~~ SOSY
60.0000 mg | PREFILLED_SYRINGE | Freq: Once | SUBCUTANEOUS | Status: AC
  Administered 2022-09-30: 60 mg via SUBCUTANEOUS

## 2022-10-04 NOTE — Progress Notes (Signed)
Referring Provider: Donita Brooks, MD Primary Care Physician:  Donita Brooks, MD Primary GI Physician: Dr. Marletta Lor  Chief Complaint  Patient presents with   Follow-up    Still having diarrhea.     HPI:   Dawn Conner is a 89 y.o. female  with history of dementia, hypothyroidism, HTN, HLD, osteoporosis, and chronic history of intermittent diarrhea likely secondary to lactose intolerance and bile salt diarrhea s/p cholecystectomy, previously well controlled with tums before meals and a lactose free diet.  Previously, TSH within normal limits on medication and celiac screen negative.  Developed intermittent constipation with associated left-sided abdominal discomfort with abdominal x ray in December 2022 revealing mild fecal loading in the region of the rectum and sigmoid colon. Noted improvement with benefiber. She is presenting today for follow-up.   Last seen in our office 07/08/2022.  Daughter reported patient had a little diarrhea when taking Keflex for UTI. Had a little constipation day prior to OV. Taking 1/2 to 1 teaspoon benefiber daily. Noted having a little red blood on toilet tissue morning of OV after second BM. No weight loss. Rectal exam with possible  internal hemorrhoids.  No gross blood seen.  No anal fissure seen.  Very little anal sphincter tone. Perianal erythema with scaly appearance. Recommended CBC, increase benefiber to 2 teaspoons daily x 2 weeks then increase to twice daily, continue tums and lactose free diet, can try daily probiotic, hydrocortisone suppository BID x 7 days, clotrimazole perianally x 10 days, follow-up in 3 months.   CBC with Hgb 13.9.    Today:  Presents with her daughter who provides essentially all history due to dementia.  Diarrhea frequency has increased a bit. Occurring a few days a week usually. No dietary changes. Weight is staying stable. Occasional toilet tissue hematochezia related to frequent wiping/perirectal irritation.  Patient  reports some abdominal pain intermittently prior to bowel movements.  On the days that she has diarrhea, stools will start out as a small amount, [place] 5, then become large amount but [place] 7 stools.  May have 3 bowel movements a day in the setting that all occur within 30 minutes.  May skip 1 day after having loose stools.  She is taking 1 Tums twice daily at lunch and dinner.  She is following a lactose-free diet for the most part.  Takes about 1 teaspoon of Benefiber and maybe 1 tablet of Benefiber.  Daughter is asking about trying a gluten-free diet.  Regarding abdominal pain, daughter feels that patient is reporting abdominal pain more frequently, typically reporting her left side hurts.  She would like to go ahead and pursue imaging to have this evaluated further.  Patient is unable to tell me much about this.  She denies any pain currently.    Past Medical History:  Diagnosis Date   Dementia ([facility])    Diverticulosis    Hyperlipidemia    Hypertension    Osteoporosis    Oth fracture of shaft of right humerus, init for opn fx 1990's   Renal insufficiency    Thyroid disease     Past Surgical History:  Procedure Laterality Date   ABDOMINAL HYSTERECTOMY  1972   APPENDECTOMY     cataract surgery     Early 2000's both eyes    CHOLECYSTECTOMY  1980's   COLONOSCOPY  03/10/2006   [place]; severe diverticulosis especially involving the sigmoid and to a lesser extent all other segments as well.  There was no evidence of neoplastic  pathology. Recommended continuing Metamucil as well as Questran which appeared to keep her fairly regular. Consider repeat in 5 years.   OVARIAN CYST REMOVAL  09/2012   TONSILLECTOMY  age 25    Current Outpatient Medications  Medication Sig Dispense Refill   aspirin EC 81 MG tablet Take by mouth.     calcium carbonate (TUMS - DOSED IN MG ELEMENTAL CALCIUM) 500 MG chewable tablet Chew 1 tablet by mouth. 1-2 meals per day     Cholecalciferol (VITAMIN  D3) 25 MCG (1000 UT) CAPS Take 1-2 capsules (1,000-2,000 Units total) by mouth 2 (two) times daily. 1 tablet in morning and 2 tablets in evening     levothyroxine (SYNTHROID) 75 MCG tablet TAKE 1 TABLET BY MOUTH BEFORE BREAKFAST 90 tablet 3   Multiple Minerals-Vitamins (CALCIUM-MAGNESIUM-ZINC-D3 PO) Take 1-2 tablets by mouth daily.     Multiple Vitamins-Minerals (MULTIVITAMIN ADULTS 50+ PO) Take by mouth.     simvastatin (ZOCOR) 20 MG tablet Take 1 tablet (20 mg total) by mouth at bedtime. 30 tablet 1   donepezil (ARICEPT) 10 MG tablet Take 1 tablet (10 mg total) by mouth at bedtime. 30 tablet 11   hydrocortisone (ANUSOL-HC) 25 MG suppository Place 1 suppository (25 mg total) rectally 2 (two) times daily. (Patient not taking: Reported on 10/07/2022) 12 suppository 1   loperamide (IMODIUM) 2 MG capsule Take by mouth. (Patient not taking: Reported on 10/07/2022)     No current facility-administered medications for this visit.    Allergies as of 10/07/2022 - Review Complete 10/07/2022  Allergen Reaction Noted   Sulfa antibiotics  11/09/2018    Family History  Problem Relation Age of Onset   Colon cancer Neg Hx     Social History   Socioeconomic History   Marital status: Widowed    Spouse name: Not on file   Number of children: Not on file   Years of education: Not on file   Highest education level: Not on file  Occupational History   Not on file  Tobacco Use   Smoking status: Never   Smokeless tobacco: Never  Vaping Use   Vaping Use: Never used  Substance and Sexual Activity   Alcohol use: Never   Drug use: Never   Sexual activity: Not Currently  Other Topics Concern   Not on file  Social History Narrative   Lives with [name] and son in Social worker.    Social Determinants of Health   Financial Resource Strain: Low Risk  (05/03/2022)   Overall Financial Resource Strain (CARDIA)    Difficulty of Paying Living Expenses: Not hard at all  Food Insecurity: No Food Insecurity  (05/03/2022)   Hunger Vital Sign    Worried About Running Out of Food in the Last Year: Never true    Ran Out of Food in the Last Year: Never true  Transportation Needs: No Transportation Needs (05/03/2022)   PRAPARE - Administrator, Civil Service (Medical): No    Lack of Transportation (Non-Medical): No  Physical Activity: Insufficiently Active (05/03/2022)   Exercise Vital Sign    Days of Exercise per Week: 3 days    Minutes of Exercise per Session: 10 min  Stress: No Stress Concern Present (05/03/2022)   [employer] of Occupational Health - Occupational Stress Questionnaire    Feeling of Stress : Only a little  Social Connections: Moderately Isolated (05/03/2022)   Social Connection and Isolation Panel [NHANES]    Frequency of Communication with Friends and Family:  Twice a week    Frequency of Social Gatherings with Friends and Family: Twice a week    Attends Religious Services: 1 to 4 times per year    Active Member of [employer] or Organizations: No    Attends Banker Meetings: Never    Marital Status: Widowed    Review of Systems: Gen: Denies fever, chills, cold or flu like symptoms, presyncope, syncope. CV: Denies chest pain, palpitations. Resp: Denies dyspnea at rest, cough. GI:See HPI Heme: See HPI  Physical Exam: BP 126/75 (BP Location: Right Arm, Patient Position: Sitting, Cuff Size: Normal)   Pulse 72   Temp 97.8 F (36.6 C) (Temporal)   Ht 4\' 11"  (1.499 m)   Wt 105 lb 9.6 oz (47.9 kg)   SpO2 94%   BMI 21.33 kg/m  General:   Alert and oriented. No distress noted. Pleasant and cooperative.  Head:  Normocephalic and atraumatic. Eyes:  Conjuctiva clear without scleral icterus. Heart:  S1, S2 present without murmurs appreciated. Lungs:  Clear to auscultation bilaterally. No wheezes, rales, or rhonchi. No distress.  Abdomen:  +BS, soft, and non-distended.  Very mild generalized tenderness to palpation.  No rebound or guarding. No HSM or  masses noted. Msk:  Symmetrical without gross deformities. Normal posture. Extremities:  Without edema. Neurologic:  Alert and  oriented x4 Psych:  Normal mood and affect.    Assessment:  89 year old female with history of dementia, hypothyroidism, HTN, HLD, osteoporosis, chronic intermittent diarrhea likely secondary to lactose intolerance and bile salt diarrhea s/p cholecystectomy, intermittent constipation, presenting today for follow-up of alternating constipation and diarrhea.  Alternating constipation and diarrhea: Has been fairly well-controlled with Tums and Benefiber.  Daughter is reporting some increased frequency of diarrhea occurring a few days a week with up to 3 bowel movements a day all within 30 minutes.  No dietary changes.  No alarm symptoms.  Currently taking Tums before lunch and dinner and 1 teaspoon of Benefiber and sometimes 1 Benefiber tablet. Prior celiac screen negative and TSH within normal limits.  No indication for stool testing at this time. Recommended increasing Tums to 3 times daily before meals, increase Benefiber to 2 teaspoons twice daily.  Daughter asked about trying a gluten-free diet.  Advised that prior screen does not show celiac disease, but she could have gluten intolerance and it would be reasonable to try this.  Abdominal pain: Chronic intermittent left-sided abdominal pain.  This could be secondary to IBS and/or underlying constipation as pain initially started back in 2022 when patient was found to be constipated.  However, daughter is reporting increased frequency of abdominal pain. On exam today, patient does have very mild generalized tenderness to palpation.  Unfortunately, patient is unable to give me any additional details regarding her pain.  At this point, daughter would like to pursue abdominal imaging for further evaluation.   Plan:  Take Tums 3 times daily before meals. Increase Benefiber to 2 teaspoons twice daily. Try IBgard to see if  this will help with abdominal discomfort and intermittent loose stools. Okay to try gluten-free diet. Continue lactose-free diet. Pending upcoming blood work with PCP, will plan to arrange CT A/P with contrast to further evaluate patient's abdominal pain. Follow-up in 3 months or sooner if needed.   Ermalinda Memos, PA-C [facility] 10/07/2022

## 2022-10-07 ENCOUNTER — Ambulatory Visit (INDEPENDENT_AMBULATORY_CARE_PROVIDER_SITE_OTHER): Payer: Medicare Other | Admitting: Gastroenterology

## 2022-10-07 ENCOUNTER — Encounter: Payer: Self-pay | Admitting: Gastroenterology

## 2022-10-07 VITALS — BP 126/75 | HR 72 | Temp 97.8°F | Ht 59.0 in | Wt 105.6 lb

## 2022-10-07 DIAGNOSIS — R1084 Generalized abdominal pain: Secondary | ICD-10-CM | POA: Insufficient documentation

## 2022-10-07 DIAGNOSIS — R198 Other specified symptoms and signs involving the digestive system and abdomen: Secondary | ICD-10-CM

## 2022-10-07 NOTE — Patient Instructions (Addendum)
Increase Tums to 3 times daily before meals.   Increase Benefiber to 2 teaspoons twice daily.  You can try using IBgard to see if this helps with abdominal discomfort and loose stools.  Follow the instructions on the box.  You can pick this up over-the-counter.  As we discussed, you can also try a gluten-free diet if you would like.  Prior blood work has shown that you do not have a gluten allergy, but you could have gluten intolerance.  After you have your blood work completed with your primary care doctor, I will take a look at this to ensure that your kidney function is okay, and we will arrange for a CT scan.  We will follow-up with you in the office in 3 months.  Do not hesitate to call sooner if you have questions or concerns.  It was great to see you both again today!  Ermalinda Memos, PA-C [facility] Gastroenter[facility]

## 2022-10-09 ENCOUNTER — Other Ambulatory Visit: Payer: Self-pay

## 2022-10-09 DIAGNOSIS — M816 Localized osteoporosis [Lequesne]: Secondary | ICD-10-CM

## 2022-10-10 ENCOUNTER — Encounter: Payer: Self-pay | Admitting: Family Medicine

## 2022-10-10 ENCOUNTER — Ambulatory Visit (INDEPENDENT_AMBULATORY_CARE_PROVIDER_SITE_OTHER): Payer: Medicare Other | Admitting: Family Medicine

## 2022-10-10 VITALS — BP 112/62 | HR 71 | Temp 97.6°F | Ht 59.0 in | Wt 107.8 lb

## 2022-10-10 DIAGNOSIS — F0150 Vascular dementia without behavioral disturbance: Secondary | ICD-10-CM | POA: Diagnosis not present

## 2022-10-10 DIAGNOSIS — E039 Hypothyroidism, unspecified: Secondary | ICD-10-CM

## 2022-10-10 MED ORDER — DONEPEZIL HCL 10 MG PO TABS: 10.0000 mg | ORAL_TABLET | Freq: Every day | ORAL | 11 refills | Status: AC

## 2022-10-10 NOTE — Progress Notes (Signed)
Subjective:    Patient ID: Dawn Conner, female    DOB: 1928/01/25, 89 y.o.   MRN: [id number]  HPI Patient is today with her daughter.  She has moderate to advanced vascular dementia.  Unfortunately, the daughter states that dementia is progressing.  Today the patient is unable to answer any questions.  She does not speak.  Daughter states that she is not sundowning.  She does not demonstrate any wandering or violent behavior.  She gets out of bed once every evening to go to the restroom but otherwise is sleeping pretty well.  She does get agitated more but there are no violent outburst.  Agitation is limited to when the daughter is having to dress her or perform hygiene measures incontinence.  This is most likely because the patient is startled and scared and does not understand.  As soon as the daughter is finished, the agitation calms down.  Daughter denies any hallucinations fevers or chills. Past Medical History:  Diagnosis Date   Dementia (HCC)    Diverticulosis    Hyperlipidemia    Hypertension    Osteoporosis    Oth fracture of shaft of right humerus, init for opn fx 1990's   Renal insufficiency    Thyroid disease    Past Surgical History:  Procedure Laterality Date   ABDOMINAL HYSTERECTOMY  1972   APPENDECTOMY     cataract surgery     Early 2000's both eyes    CHOLECYSTECTOMY  1980's   COLONOSCOPY  03/10/2006   [facility]; severe diverticulosis especially involving the sigmoid and to a lesser extent all other segments as well.  There was no evidence of neoplastic pathology. Recommended continuing Metamucil as well as Questran which appeared to keep her fairly regular. Consider repeat in 5 years.   OVARIAN CYST REMOVAL  09/2012   TONSILLECTOMY  age 41   Current Outpatient Medications on File Prior to Visit  Medication Sig Dispense Refill   aspirin EC 81 MG tablet Take by mouth.     calcium carbonate (TUMS - DOSED IN MG ELEMENTAL CALCIUM) 500 MG chewable tablet Chew 1 tablet  by mouth. 1-2 meals per day     Cholecalciferol (VITAMIN D3) 25 MCG (1000 UT) CAPS Take 1-2 capsules (1,000-2,000 Units total) by mouth 2 (two) times daily. 1 tablet in morning and 2 tablets in evening     levothyroxine (SYNTHROID) 75 MCG tablet TAKE 1 TABLET BY MOUTH BEFORE BREAKFAST 90 tablet 3   Multiple Minerals-Vitamins (CALCIUM-MAGNESIUM-ZINC-D3 PO) Take 1-2 tablets by mouth daily.     Multiple Vitamins-Minerals (MULTIVITAMIN ADULTS 50+ PO) Take by mouth.     simvastatin (ZOCOR) 20 MG tablet Take 1 tablet (20 mg total) by mouth at bedtime. 30 tablet 1   hydrocortisone (ANUSOL-HC) 25 MG suppository Place 1 suppository (25 mg total) rectally 2 (two) times daily. (Patient not taking: Reported on 10/07/2022) 12 suppository 1   loperamide (IMODIUM) 2 MG capsule Take by mouth. (Patient not taking: Reported on 10/07/2022)     No current facility-administered medications on file prior to visit.   Allergies  Allergen Reactions   Sulfa Antibiotics    Social History   Socioeconomic History   Marital status: Widowed    Spouse name: Not on file   Number of children: Not on file   Years of education: Not on file   Highest education level: Not on file  Occupational History   Not on file  Tobacco Use   Smoking status: Never   Smokeless  tobacco: Never  Vaping Use   Vaping Use: Never used  Substance and Sexual Activity   Alcohol use: Never   Drug use: Never   Sexual activity: Not Currently  Other Topics Concern   Not on file  Social History Narrative   Lives with [name] and son in law.    Social Determinants of Health   Financial Resource Strain: Low Risk  (05/03/2022)   Overall Financial Resource Strain (CARDIA)    Difficulty of Paying Living Expenses: Not hard at all  Food Insecurity: No Food Insecurity (05/03/2022)   Hunger Vital Sign    Worried About Running Out of Food in the Last Year: Never true    Ran Out of Food in the Last Year: Never true  Transportation Needs: No  Transportation Needs (05/03/2022)   PRAPARE - Administrator, Civil Service (Medical): No    Lack of Transportation (Non-Medical): No  Physical Activity: Insufficiently Active (05/03/2022)   Exercise Vital Sign    Days of Exercise per Week: 3 days    Minutes of Exercise per Session: 10 min  Stress: No Stress Concern Present (05/03/2022)   [employer] of Occupational Health - Occupational Stress Questionnaire    Feeling of Stress : Only a little  Social Connections: Moderately Isolated (05/03/2022)   Social Connection and Isolation Panel [NHANES]    Frequency of Communication with Friends and Family: Twice a week    Frequency of Social Gatherings with Friends and Family: Twice a week    Attends Religious Services: 1 to 4 times per year    Active Member of [employer] or Organizations: No    Attends Banker Meetings: Never    Marital Status: Widowed  Intimate Partner Violence: Not At Risk (05/03/2022)   Humiliation, Afraid, Rape, and Kick questionnaire    Fear of Current or Ex-Partner: No    Emotionally Abused: No    Physically Abused: No    Sexually Abused: No     Review of Systems  All other systems reviewed and are negative.      Objective:   Physical Exam Vitals reviewed.  Constitutional:      General: She is not in acute distress.    Appearance: Normal appearance. She is normal weight. She is not toxic-appearing.  Neck:     Vascular: No carotid bruit.  Cardiovascular:     Rate and Rhythm: Normal rate and regular rhythm.     Heart sounds: Murmur heard.     No friction rub. No gallop.  Pulmonary:     Effort: Pulmonary effort is normal. No respiratory distress.     Breath sounds: Normal breath sounds. No stridor. No wheezing or rales.  Abdominal:     General: Abdomen is flat. Bowel sounds are normal. There is no distension.     Palpations: Abdomen is soft.     Tenderness: There is no abdominal tenderness. There is no guarding or rebound.   Musculoskeletal:     Right lower leg: No edema.     Left lower leg: No edema.  Lymphadenopathy:     Cervical: No cervical adenopathy.  Neurological:     General: No focal deficit present.     Mental Status: She is alert.     Cranial Nerves: No cranial nerve deficit.     Motor: No weakness.  Psychiatric:        Mood and Affect: Affect is flat.        Cognition and Memory: Cognition  is impaired. Memory is impaired. She exhibits impaired recent memory.          Assessment & Plan:   Acquired hypothyroidism - Plan: CBC with Differential/Platelet, COMPLETE METABOLIC PANEL WITH GFR, TSH  Vascular dementia without behavioral disturbance (HCC) At this point, there is no indication to add Rexulti or Seroquel.  We will increase area milligrams a day.  Daughter states that her GI physician requested to recheck renal function so that they can order a CAT scan of her abdomen.  I will be happy to do that forward that information to the gastroenterologist.  Check CBC CMP and TSH.  Otherwise the daughter is doing an amazing job of caring for her mother.  Monitor closely for caregiver fatigue

## 2022-10-11 ENCOUNTER — Encounter: Payer: Self-pay | Admitting: *Deleted

## 2022-10-11 ENCOUNTER — Telehealth: Payer: Self-pay

## 2022-10-11 ENCOUNTER — Other Ambulatory Visit: Payer: Self-pay | Admitting: *Deleted

## 2022-10-11 DIAGNOSIS — R1084 Generalized abdominal pain: Secondary | ICD-10-CM

## 2022-10-11 LAB — COMPLETE METABOLIC PANEL WITH GFR
AG Ratio: 1.2 (calc) (ref 1.0–2.5)
ALT: 17 U/L (ref 6–29)
AST: 25 U/L (ref 10–35)
Albumin: 3.9 g/dL (ref 3.6–5.1)
Alkaline phosphatase (APISO): 53 U/L (ref 37–153)
BUN/Creatinine Ratio: 20 (calc) (ref 6–22)
BUN: 22 mg/dL (ref 7–25)
CO2: 29 mmol/L (ref 20–32)
Calcium: 9.8 mg/dL (ref 8.6–10.4)
Chloride: 102 mmol/L (ref 98–110)
Creat: 1.1 mg/dL — ABNORMAL HIGH (ref 0.60–0.95)
Globulin: 3.3 g/dL (calc) (ref 1.9–3.7)
Glucose, Bld: 101 mg/dL — ABNORMAL HIGH (ref 65–99)
Potassium: 4.3 mmol/L (ref 3.5–5.3)
Sodium: 139 mmol/L (ref 135–146)
Total Bilirubin: 0.5 mg/dL (ref 0.2–1.2)
Total Protein: 7.2 g/dL (ref 6.1–8.1)
eGFR: 47 mL/min/{1.73_m2} — ABNORMAL LOW (ref >=60)

## 2022-10-11 LAB — CBC WITH DIFFERENTIAL/PLATELET
Absolute Monocytes: 551 cells/uL (ref 200–950)
Basophils Absolute: 41 cells/uL (ref 0–200)
Basophils Relative: 0.6 %
Eosinophils Absolute: 143 cells/uL (ref 15–500)
Eosinophils Relative: 2.1 %
HCT: 39.5 % (ref 35.0–45.0)
Hemoglobin: 12.8 g/dL (ref 11.7–15.5)
Lymphs Abs: 1863 cells/uL (ref 850–3900)
MCH: 27.8 pg (ref 27.0–33.0)
MCHC: 32.4 g/dL (ref 32.0–36.0)
MCV: 85.7 fL (ref 80.0–100.0)
MPV: 11.2 fL (ref 7.5–12.5)
Monocytes Relative: 8.1 %
Neutro Abs: 4202 cells/uL (ref 1500–7800)
Neutrophils Relative %: 61.8 %
Platelets: 208 10*3/uL (ref 140–400)
RBC: 4.61 10*6/uL (ref 3.80–5.10)
RDW: 13.3 % (ref 11.0–15.0)
Total Lymphocyte: 27.4 %
WBC: 6.8 10*3/uL (ref 3.8–10.8)

## 2022-10-11 LAB — TSH: TSH: 1.98 mIU/L (ref 0.40–4.50)

## 2022-10-11 NOTE — Progress Notes (Signed)
   Care Guide Note  10/11/2022 Name: Marieme Mcmackin MRN: [id number] DOB: 11/16/1927  Referred by: Donita Brooks, MD Reason for referral : Care Coordination (Outreach to schedule referral )   Abbrielle Batts is a 89 y.o. year old female who is a primary care patient of Tanya Nones, Priscille Heidelberg, MD. Tawonda Legaspi was referred to the pharmacist for assistance related to  other  .    Successful contact was made with the patient to discuss pharmacy services including being ready for the pharmacist to call at least 5 minutes before the scheduled appointment time, to have medication bottles and any blood sugar or blood pressure readings ready for review. The patient agreed to meet with the pharmacist via with the pharmacist via telephone visit on (date/time).  10/23/2022  Penne Lash, RMA Care Guide [facility]  [place], [place] [address] Direct Dial: [phone] [email or web address]

## 2022-10-12 ENCOUNTER — Encounter: Payer: Self-pay | Admitting: Gastroenterology

## 2022-10-15 ENCOUNTER — Ambulatory Visit: Admission: RE | Admit: 2022-10-15 | Payer: Medicare Other | Source: Ambulatory Visit

## 2022-10-21 IMAGING — CT CT HEAD W/O CM
4 series · 16 of 47 positions shown, 18 images · non-contrast
Comparison: Head CT 01/05/2020.

CLINICAL DATA: [AGE] female with history of altered mental
status. Weakness.



[Series 2: head w o · axial · 0.41mm/px · z∈[-93,+17]mm · 7 of 30 slices shown, 9 images]
[im 4/30  brain]
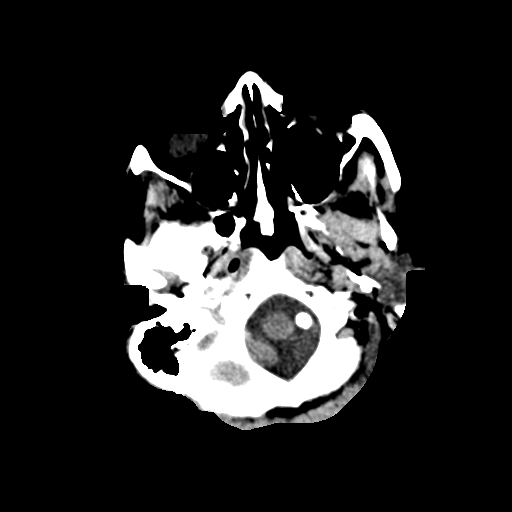
[im 4/30  bone]
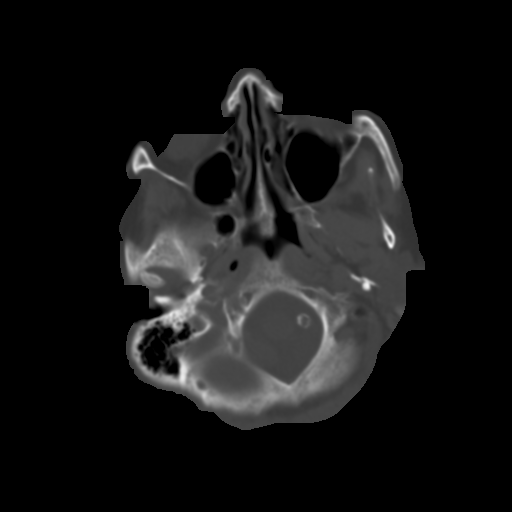
[im 8/30  brain]
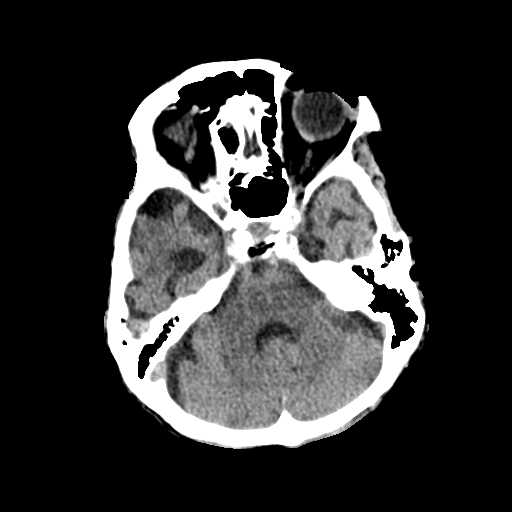
[im 11/30  brain]
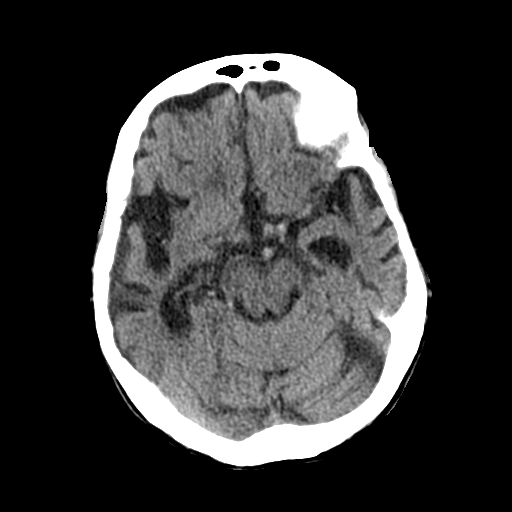
[im 15/30  brain]
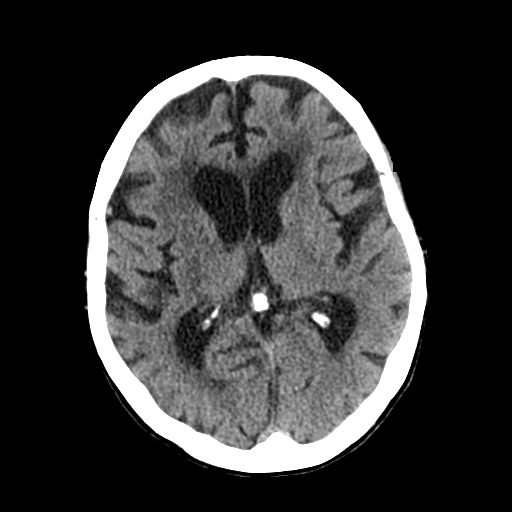
[im 19/30  brain]
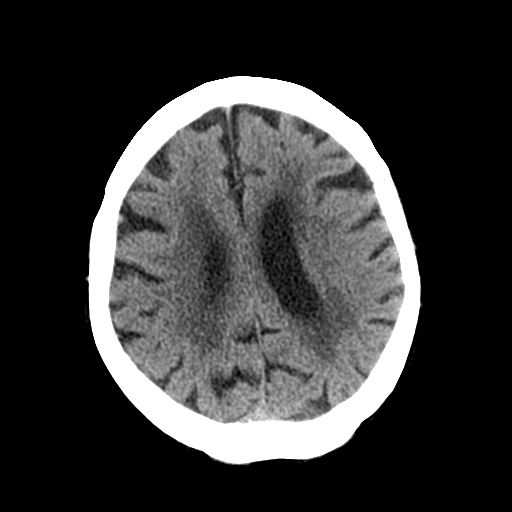
[im 19/30  bone]
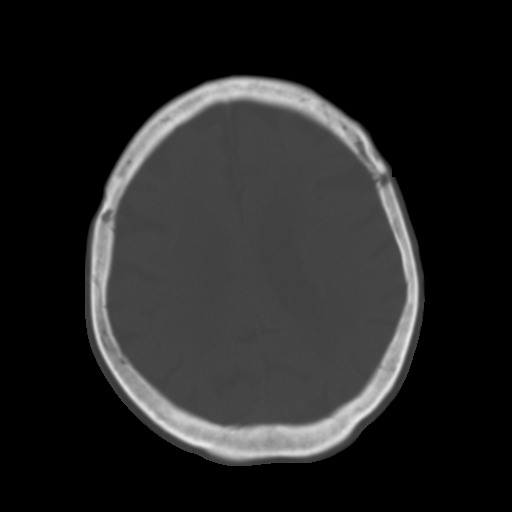
[im 22/30  brain]
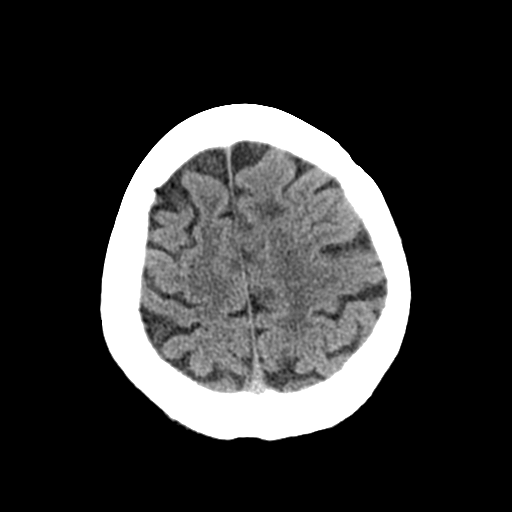
[im 26/30  brain]
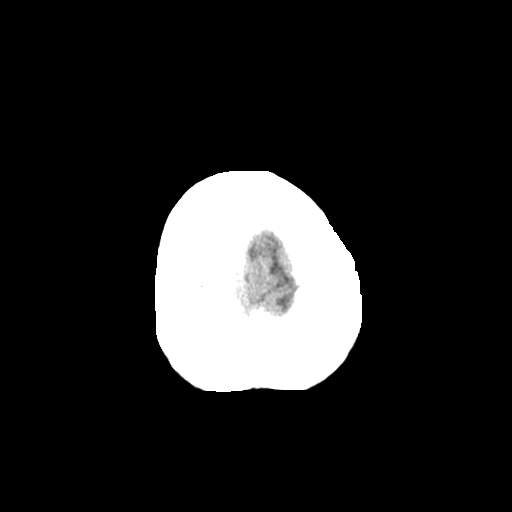

[Series 3: head bone · axial · 0.41mm/px · z∈[-94,-64]mm · 3 of 75 slices shown]
[im 8/75  bone]
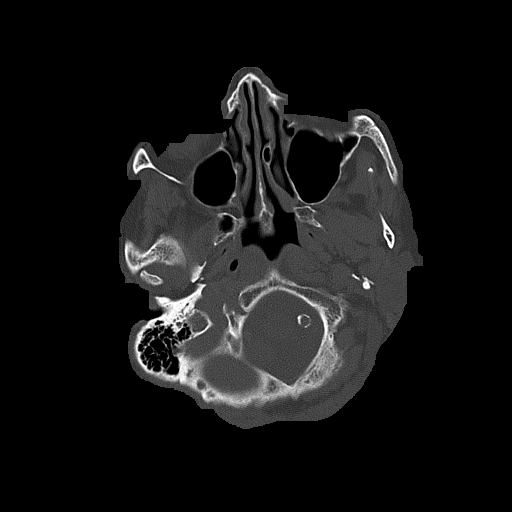
[im 15/75  bone]
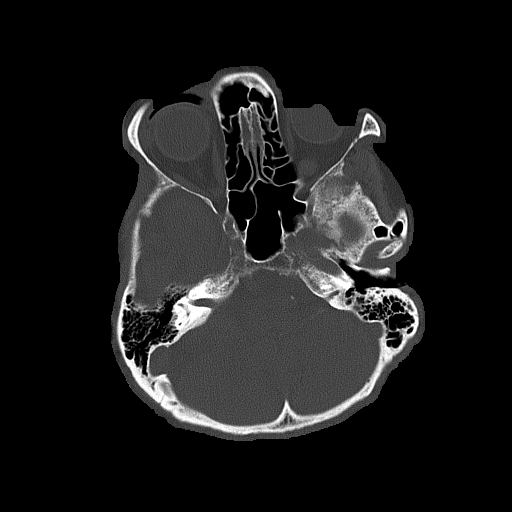
[im 23/75  bone]
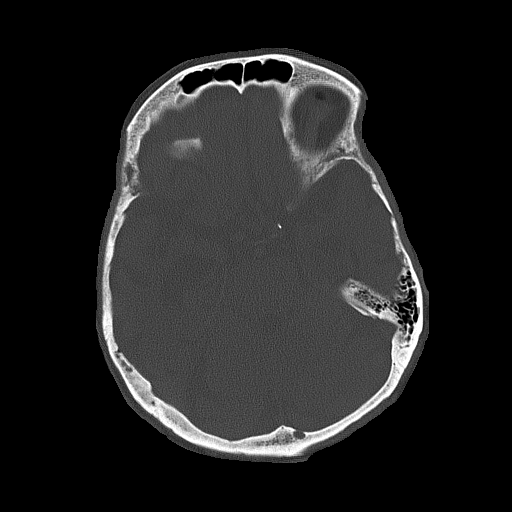

[Series 4: coronal soft · coronal · 0.31mm/px · 3 of 72 slices shown]
[im 24/72  brain]
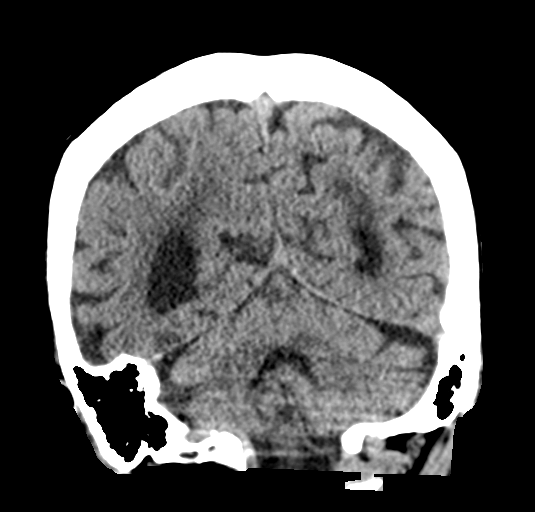
[im 32/72  brain]
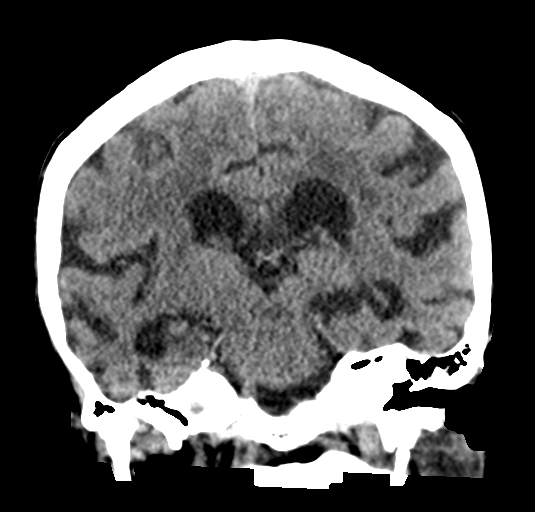
[im 40/72  brain]
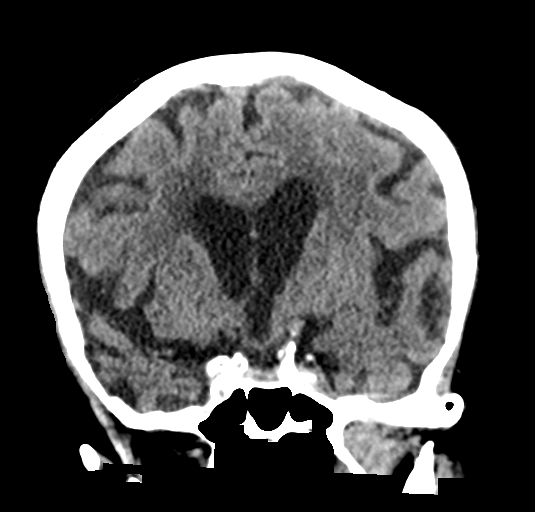

[Series 5: sagittal soft · sagittal · 0.33mm/px · 3 of 53 slices shown]
[im 18/53  brain]
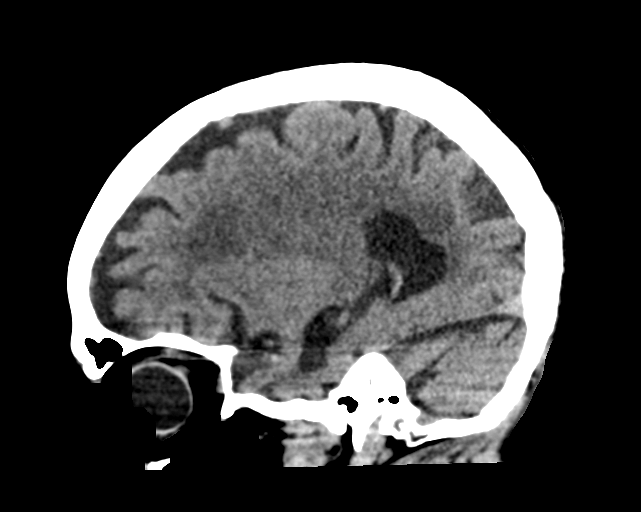
[im 27/53  brain]
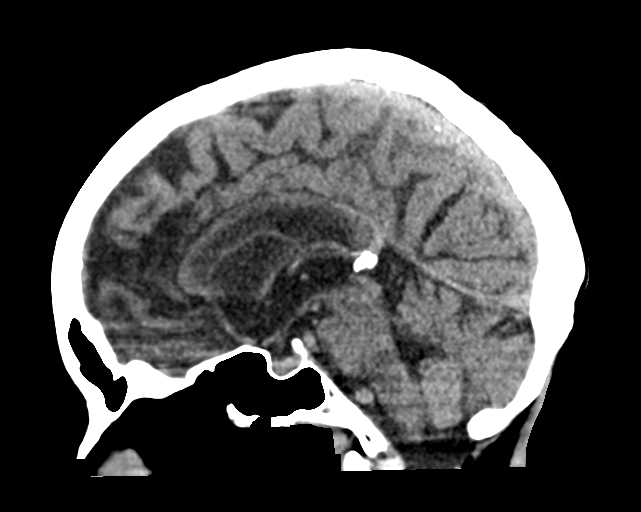
[im 35/53  brain]
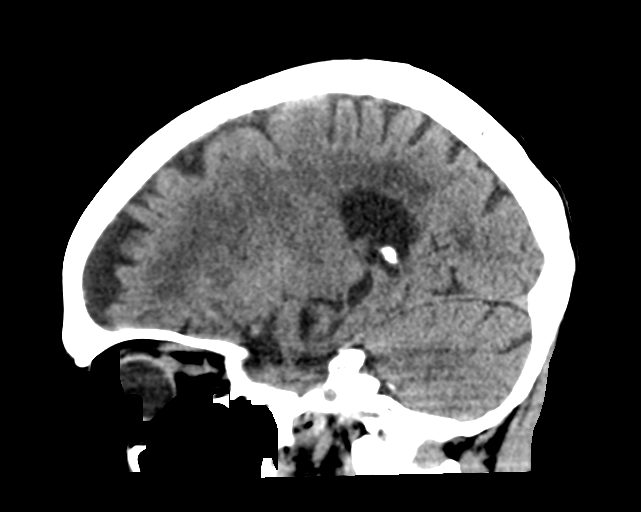

[16 of 47 positions shown; findings below may reference images not displayed]

FINDINGS: Brain: Moderate cerebral and mild cerebellar atrophy. Patchy and
confluent areas of decreased attenuation are noted throughout the
deep and periventricular white matter of the cerebral hemispheres
bilaterally, compatible with chronic microvascular ischemic disease.
No evidence of acute infarction, hemorrhage, hydrocephalus,
extra-axial collection or mass lesion/mass effect.

Vascular: Multiple atherosclerotic calcifications are noted within
the cerebral vasculature.

Skull: Normal. Negative for fracture or focal lesion.

Sinuses/Orbits: No acute finding.

Other: None.
IMPRESSION: 1. No acute intracranial abnormalities.
2. Moderate cerebral and mild cerebellar atrophy with extensive
chronic microvascular ischemic changes in the cerebral white matter,
as above.

## 2022-10-22 ENCOUNTER — Ambulatory Visit
Admission: RE | Admit: 2022-10-22 | Discharge: 2022-10-22 | Disposition: A | Discharge status: Home / Self Care | Payer: Medicare Other | Source: Ambulatory Visit | Attending: Gastroenterology | Admitting: Gastroenterology

## 2022-10-22 DIAGNOSIS — R1084 Generalized abdominal pain: Secondary | ICD-10-CM | POA: Diagnosis present

## 2022-10-22 IMAGING — DX DG CHEST 1V PORT
1 series · 1 of 1 positions shown · non-contrast
Comparison: 09/17/2021

CLINICAL DATA: Weakness, SIRS

EXAM:
PORTABLE CHEST 1 VIEW

[chest ap]
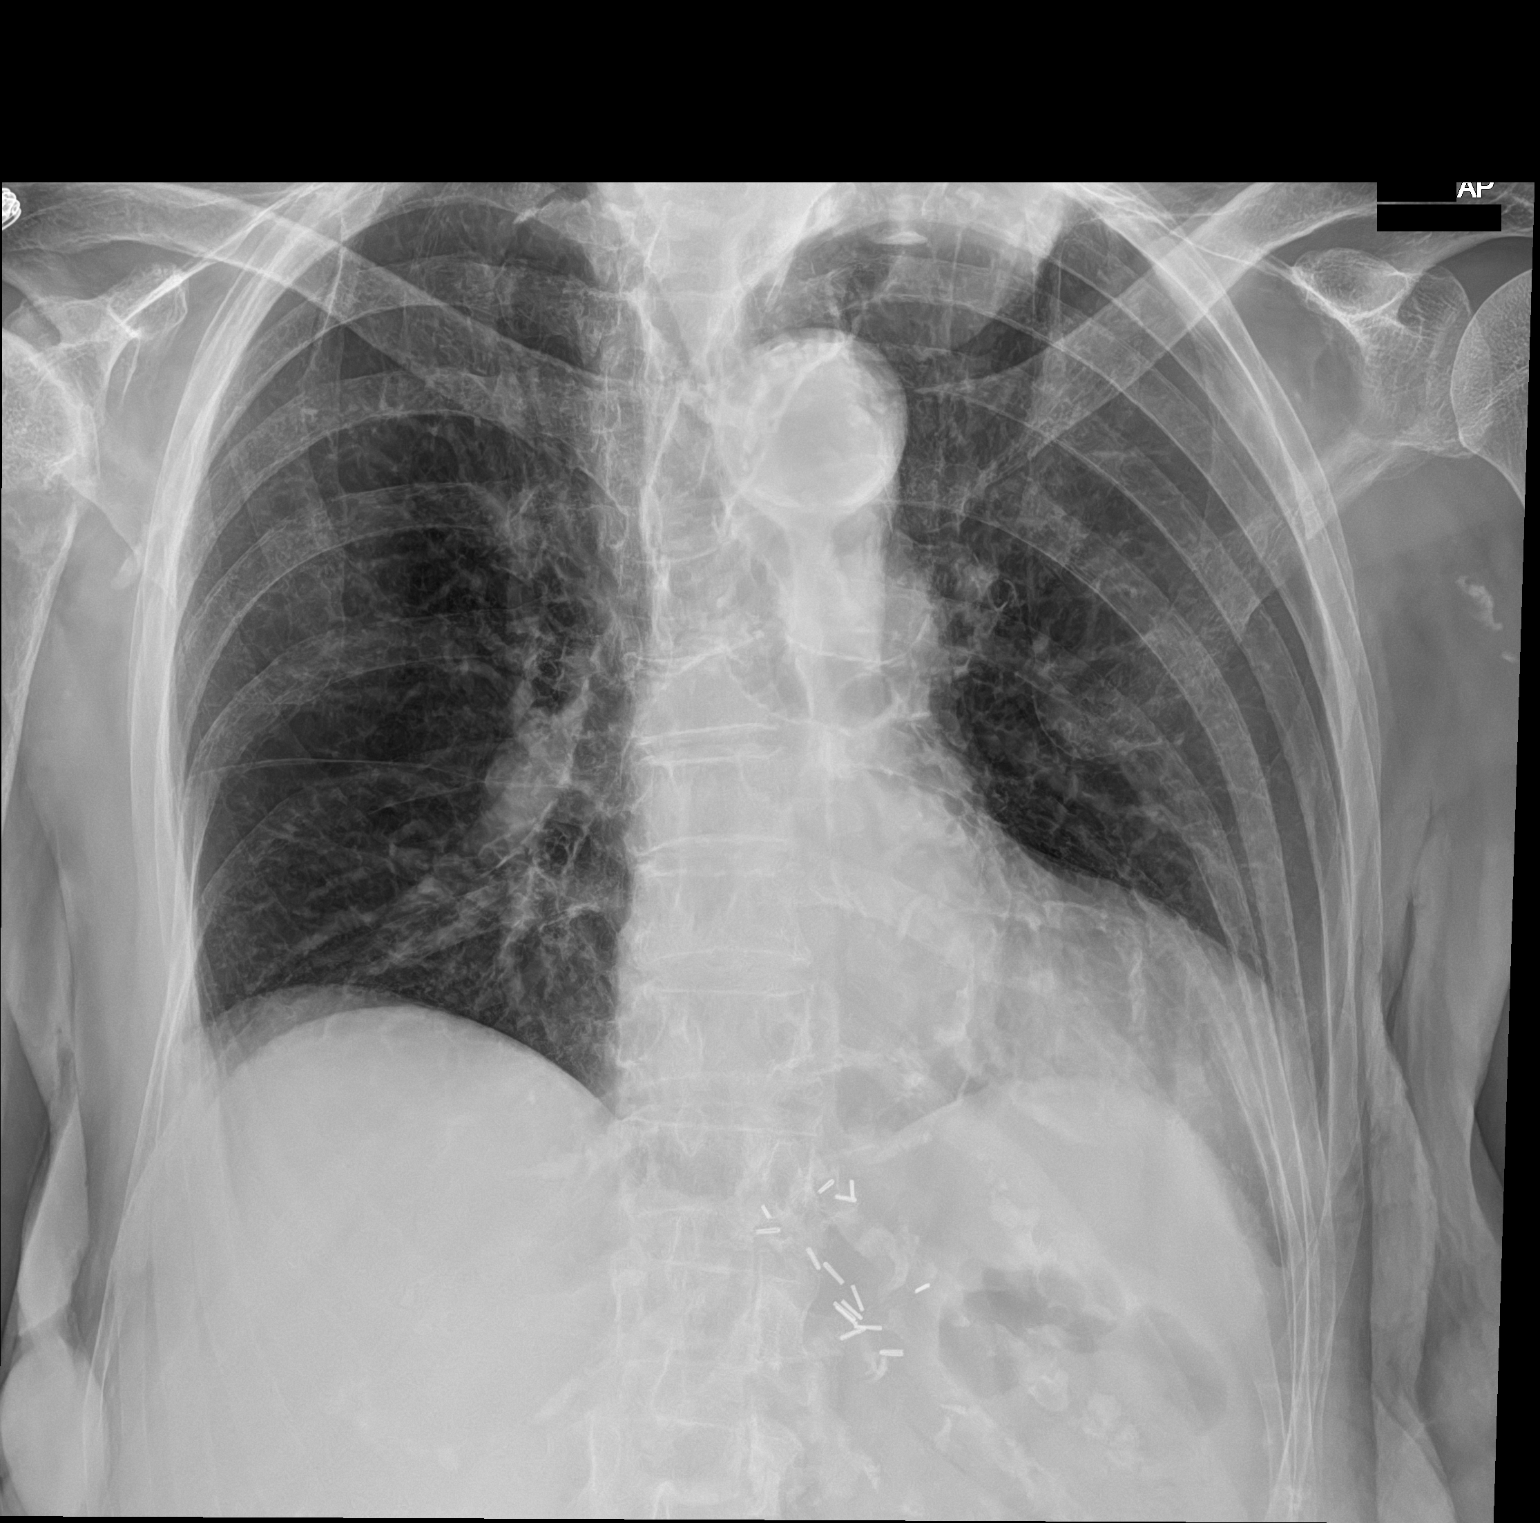

[1 of 1 positions shown; findings below may reference images not displayed]

FINDINGS: No new consolidation or edema. No pleural effusion or pneumothorax.
Stable cardiomediastinal contours. Surgical clips at the epigastric
region.
IMPRESSION: No acute process in the chest.

## 2022-10-22 MED ORDER — IOHEXOL 300 MG/ML  SOLN
80.0000 mL | Freq: Once | INTRAMUSCULAR | Status: AC | PRN
  Administered 2022-10-22: 80 mL via INTRAVENOUS

## 2022-10-22 MED ORDER — BARIUM SULFATE 2 % PO SUSP
450.0000 mL | ORAL | Status: AC
  Administered 2022-10-22 (×2): 450 mL via ORAL

## 2022-10-23 ENCOUNTER — Other Ambulatory Visit: Payer: Medicare Other | Admitting: Pharmacist

## 2022-11-15 ENCOUNTER — Telehealth: Payer: Self-pay | Admitting: Family Medicine

## 2022-11-15 NOTE — Telephone Encounter (Signed)
Patient's daughter [name] called to notify provider they've settled on a facility for the patient: [facility]. Facility will be faxing FL2 paperwork to the office for completion by the provider; this form does not have an associated completion fee.  Paperwork will be placed on nurse's desk when received.

## 2022-11-19 ENCOUNTER — Other Ambulatory Visit: Payer: Self-pay

## 2022-11-19 ENCOUNTER — Encounter: Payer: Self-pay | Admitting: Family Medicine

## 2022-11-19 DIAGNOSIS — M816 Localized osteoporosis [Lequesne]: Secondary | ICD-10-CM

## 2022-11-19 MED ORDER — CALCIUM CARBONATE 1250 (500 CA) MG PO TABS
1.0000 | ORAL_TABLET | Freq: Every day | ORAL | 3 refills | Status: DC
Start: 2022-11-19 — End: 2023-02-22

## 2022-11-20 ENCOUNTER — Encounter: Payer: Self-pay | Admitting: *Deleted

## 2022-11-22 ENCOUNTER — Encounter: Payer: Self-pay | Admitting: Family Medicine

## 2022-11-29 ENCOUNTER — Emergency Department (HOSPITAL_COMMUNITY): Payer: Medicare Other

## 2022-11-29 ENCOUNTER — Encounter (HOSPITAL_COMMUNITY): Payer: Self-pay | Admitting: Pharmacy Technician

## 2022-11-29 ENCOUNTER — Other Ambulatory Visit: Payer: Self-pay

## 2022-11-29 ENCOUNTER — Emergency Department (HOSPITAL_COMMUNITY)
Admission: EM | Admit: 2022-11-29 | Discharge: 2022-11-29 | Disposition: A | Discharge status: Home / Self Care | Payer: Medicare Other | Attending: Emergency Medicine | Admitting: Emergency Medicine

## 2022-11-29 DIAGNOSIS — N3000 Acute cystitis without hematuria: Secondary | ICD-10-CM | POA: Diagnosis not present

## 2022-11-29 DIAGNOSIS — I10 Essential (primary) hypertension: Secondary | ICD-10-CM | POA: Diagnosis not present

## 2022-11-29 DIAGNOSIS — W19XXXA Unspecified fall, initial encounter: Secondary | ICD-10-CM | POA: Diagnosis not present

## 2022-11-29 DIAGNOSIS — Z7982 Long term (current) use of aspirin: Secondary | ICD-10-CM | POA: Diagnosis not present

## 2022-11-29 DIAGNOSIS — F0390 Unspecified dementia without behavioral disturbance: Secondary | ICD-10-CM | POA: Diagnosis not present

## 2022-11-29 DIAGNOSIS — Z79899 Other long term (current) drug therapy: Secondary | ICD-10-CM | POA: Insufficient documentation

## 2022-11-29 DIAGNOSIS — R55 Syncope and collapse: Secondary | ICD-10-CM | POA: Diagnosis not present

## 2022-11-29 DIAGNOSIS — R531 Weakness: Secondary | ICD-10-CM

## 2022-11-29 LAB — COMPREHENSIVE METABOLIC PANEL
ALT: 23 U/L (ref 0–44)
AST: 35 U/L (ref 15–41)
Albumin: 3.3 g/dL — ABNORMAL LOW (ref 3.5–5.0)
Alkaline Phosphatase: 56 U/L (ref 38–126)
Anion gap: 10 (ref 5–15)
BUN: 28 mg/dL — ABNORMAL HIGH (ref 8–23)
CO2: 25 mmol/L (ref 22–32)
Calcium: 9.1 mg/dL (ref 8.9–10.3)
Chloride: 99 mmol/L (ref 98–111)
Creatinine, Ser: 1.33 mg/dL — ABNORMAL HIGH (ref 0.44–1.00)
GFR, Estimated: 37 mL/min — ABNORMAL LOW (ref >60)
Glucose, Bld: 141 mg/dL — ABNORMAL HIGH (ref 70–99)
Potassium: 4.4 mmol/L (ref 3.5–5.1)
Sodium: 134 mmol/L — ABNORMAL LOW (ref 135–145)
Total Bilirubin: 0.5 mg/dL (ref 0.3–1.2)
Total Protein: 7.1 g/dL (ref 6.5–8.1)

## 2022-11-29 LAB — URINALYSIS, ROUTINE W REFLEX MICROSCOPIC
Bilirubin Urine: NEGATIVE
Glucose, UA: NEGATIVE mg/dL
Hgb urine dipstick: NEGATIVE
Ketones, ur: NEGATIVE mg/dL
Leukocytes,Ua: NEGATIVE
Nitrite: POSITIVE — AB
Protein, ur: 30 mg/dL — AB
Specific Gravity, Urine: 1.009 (ref 1.005–1.030)
pH: 7 (ref 5.0–8.0)

## 2022-11-29 LAB — CBC WITH DIFFERENTIAL/PLATELET
Abs Immature Granulocytes: 0.03 10*3/uL (ref 0.00–0.07)
Basophils Absolute: 0 10*3/uL (ref 0.0–0.1)
Basophils Relative: 0 %
Eosinophils Absolute: 0 10*3/uL (ref 0.0–0.5)
Eosinophils Relative: 0 %
HCT: 42.8 % (ref 36.0–46.0)
Hemoglobin: 13.4 g/dL (ref 12.0–15.0)
Immature Granulocytes: 1 %
Lymphocytes Relative: 12 %
Lymphs Abs: 0.8 10*3/uL (ref 0.7–4.0)
MCH: 27.6 pg (ref 26.0–34.0)
MCHC: 31.3 g/dL (ref 30.0–36.0)
MCV: 88.1 fL (ref 80.0–100.0)
Monocytes Absolute: 1 10*3/uL (ref 0.1–1.0)
Monocytes Relative: 16 %
Neutro Abs: 4.6 10*3/uL (ref 1.7–7.7)
Neutrophils Relative %: 71 %
Platelets: 158 10*3/uL (ref 150–400)
RBC: 4.86 MIL/uL (ref 3.87–5.11)
RDW: 15.1 % (ref 11.5–15.5)
WBC: 6.4 10*3/uL (ref 4.0–10.5)
nRBC: 0 % (ref 0.0–0.2)

## 2022-11-29 LAB — T4, FREE: Free T4: 1.16 ng/dL — ABNORMAL HIGH (ref 0.61–1.12)

## 2022-11-29 LAB — MAGNESIUM: Magnesium: 2.2 mg/dL (ref 1.7–2.4)

## 2022-11-29 LAB — TSH: TSH: 3.049 u[IU]/mL (ref 0.350–4.500)

## 2022-11-29 LAB — TROPONIN I (HIGH SENSITIVITY)
Troponin I (High Sensitivity): 22 ng/L — ABNORMAL HIGH (ref <18)
Troponin I (High Sensitivity): 26 ng/L — ABNORMAL HIGH (ref <18)

## 2022-11-29 MED ORDER — LACTATED RINGERS IV BOLUS
1000.0000 mL | Freq: Once | INTRAVENOUS | Status: AC
  Administered 2022-11-29: 1000 mL via INTRAVENOUS

## 2022-11-29 MED ORDER — CEPHALEXIN 500 MG PO CAPS: 500.0000 mg | ORAL_CAPSULE | Freq: Two times a day (BID) | ORAL | 0 refills | Status: AC

## 2022-11-29 MED ORDER — CEPHALEXIN 500 MG PO CAPS
500.0000 mg | ORAL_CAPSULE | ORAL | Status: AC
  Administered 2022-11-29: 500 mg via ORAL
  Filled 2022-11-29: qty 1

## 2022-11-29 MED ORDER — LORAZEPAM 0.5 MG PO TABS
0.5000 mg | ORAL_TABLET | Freq: Once | ORAL | Status: AC
  Administered 2022-11-29: 0.5 mg via ORAL
  Filled 2022-11-29: qty 1

## 2022-11-29 MED ORDER — LORAZEPAM 1 MG PO TABS: 1.0000 mg | ORAL_TABLET | Freq: Once | ORAL | Status: DC

## 2022-11-29 NOTE — Discharge Instructions (Signed)
You were seen for your fainting and urinary tract infection in the emergency department.   At home, please stay well-hydrated and take the antibiotics we have prescribed you for your urinary tract infection.    Follow-up with your primary doctor in 2-3 days regarding your visit.  Cardiology will be calling you regarding an appointment within the next 72 hours.  You may contact them if you do not hear from them in that time using the information in this packet.  Return immediately to the emergency department if you experience any of the following: Chest pain, fever, difficulty breathing, unexplained vomiting or sweating, or any other concerning symptoms.    Thank you for visiting our Emergency Department. It was a pleasure taking care of you today.

## 2022-11-29 NOTE — ED Triage Notes (Signed)
Pt bib ems from [facility] with reports of syncopal event. Family stated to ems that they were outside on the patio sitting and pt slumped over. By the time ems arrived on scene pt sitting up and alert. EMS reports pt had an unwitnessed fall one or two days ago and was not evaluated for the fall.

## 2022-11-29 NOTE — ED Provider Notes (Signed)
[facility] EMERGENCY DEPARTMENT AT [facility] Provider Note   CSN: [id number] Arrival date & time: 11/29/22  1446     History {Add pertinent medical, surgical, social history, OB history to HPI:1} Chief Complaint  Patient presents with   Loss of Consciousness    Dawn Conner is a 89 y.o. female.  89 year old female with history of dementia, hypertension, hyperlipidemia, thyroid disease, and renal insufficiency who presents to the emergency department with a syncopal event.  Patient's daughter was with her today and reports that they were sitting outside on the patio and having her mother do some leg exercises when at 2 PM she slumped forward and lost consciousness for 2 minutes.  No seizure-like activity.  No history of syncopal events.  No cardiac history.  Did not have any other preceding symptoms such as chest pain or shortness of breath.  2 nights ago was found sitting on the ground but no known head trauma.  Says that she has had some generalized fatigue and difficulty walking recently.  Also hurt her left wrist recently which they think was from an unwitnessed fall.  No family history of cardiac arrhythmia or sudden unexplained death.  Has been [facility] for 1 week.       Home Medications Prior to Admission medications   Medication Sig Start Date End Date Taking? Authorizing Provider  aspirin EC 81 MG tablet Take by mouth. 12/23/19   [provider]  calcium carbonate (OS-CAL - DOSED IN MG OF ELEMENTAL CALCIUM) 1250 (500 Ca) MG tablet Take 1 tablet (1,250 mg total) by mouth daily with breakfast. 11/19/22   Donita Brooks, MD  Cholecalciferol (VITAMIN D3) 25 MCG (1000 UT) CAPS Take 1-2 capsules (1,000-2,000 Units total) by mouth 2 (two) times daily. 1 tablet in morning and 2 tablets in evening 09/18/21   Johnson, Clanford L, MD  donepezil (ARICEPT) 10 MG tablet Take 1 tablet (10 mg total) by mouth at bedtime. 10/10/22   Donita Brooks, MD  hydrocortisone  (ANUSOL-HC) 25 MG suppository Place 1 suppository (25 mg total) rectally 2 (two) times daily. Patient not taking: Reported on 10/07/2022 07/08/22   Letta Median, PA-C  levothyroxine (SYNTHROID) 75 MCG tablet TAKE 1 TABLET BY MOUTH BEFORE BREAKFAST 01/04/22   Donita Brooks, MD  loperamide (IMODIUM) 2 MG capsule Take by mouth. Patient not taking: Reported on 10/07/2022 12/23/19   [provider]  Multiple Minerals-Vitamins (CALCIUM-MAGNESIUM-ZINC-D3 PO) Take 1-2 tablets by mouth daily.    [provider]  Multiple Vitamins-Minerals (MULTIVITAMIN ADULTS 50+ PO) Take by mouth.    [provider]      Allergies    Sulfa antibiotics    Review of Systems   Review of Systems  Physical Exam Updated Vital Signs BP (!) 142/101   Pulse 78   Resp (!) 23   SpO2 96%  Physical Exam Vitals and nursing note reviewed.  Constitutional:      General: She is not in acute distress.    Appearance: Normal appearance. She is well-developed. She is not ill-appearing.     Comments: Alert and oriented x 2 at baseline per daughter  HENT:     Head: Normocephalic and atraumatic.     Right Ear: External ear normal.     Left Ear: External ear normal.     Nose: Nose normal.     Mouth/Throat:     Mouth: Mucous membranes are moist.     Pharynx: Oropharynx is clear.  Eyes:  Extraocular Movements: Extraocular movements intact.     Conjunctiva/sclera: Conjunctivae normal.     Pupils: Pupils are equal, round, and reactive to light.  Neck:     Comments: No C-spine midline tenderness to palpation Cardiovascular:     Rate and Rhythm: Normal rate and regular rhythm.     Pulses: Normal pulses.     Heart sounds: Normal heart sounds. No murmur heard. Pulmonary:     Effort: Pulmonary effort is normal. No respiratory distress.     Breath sounds: Normal breath sounds.  Abdominal:     General: Abdomen is flat. There is no distension.     Palpations: Abdomen is soft. There is no mass.      Tenderness: There is no abdominal tenderness. There is no guarding.  Musculoskeletal:        General: No deformity. Normal range of motion.     Cervical back: Normal range of motion and neck supple. No rigidity or tenderness.     Right lower leg: No edema.     Left lower leg: No edema.     Comments: No tenderness to palpation of midline thoracic or lumbar spine.  No step-offs palpated.  No tenderness to palpation of chest wall.  No bruising noted.  No tenderness to palpation of bilateral clavicles.  No tenderness to palpation, bruising, or deformities noted of bilateral shoulders, elbows, hips, knees, or ankles.  Bruising and tenderness palpation of left wrist.  Skin:    General: Skin is warm and dry.  Neurological:     General: No focal deficit present.     Mental Status: She is alert. Mental status is at baseline.     Cranial Nerves: No cranial nerve deficit.     Sensory: No sensory deficit.     Motor: No weakness.  Psychiatric:        Mood and Affect: Mood normal.     ED Results / Procedures / Treatments   Labs (all labs ordered are listed, but only abnormal results are displayed) Labs Reviewed - No data to display  EKG None  Radiology No results found.  Procedures Procedures  {Document cardiac monitor, telemetry assessment procedure when appropriate:1}  Medications Ordered in ED Medications - No data to display  ED Course/ Medical Decision Making/ A&P   {   Click here for ABCD2, HEART and other calculatorsREFRESH Note before signing :1}                              Medical Decision Making Amount and/or Complexity of Data Reviewed Labs: ordered. Radiology: ordered.   ***  {Document critical care time when appropriate:1} {Document review of labs and clinical decision tools ie heart score, Chads2Vasc2 etc:1}  {Document your independent review of radiology images, and any outside records:1} {Document your discussion with family members, caretakers, and  with consultants:1} {Document social determinants of health affecting pt's care:1} {Document your decision making why or why not admission, treatments were needed:1} Final Clinical Impression(s) / ED Diagnoses Final diagnoses:  None    Rx / DC Orders ED Discharge Orders     None

## 2022-12-05 ENCOUNTER — Telehealth: Payer: Self-pay

## 2022-12-05 ENCOUNTER — Encounter: Payer: Self-pay | Admitting: Gastroenterology

## 2022-12-05 NOTE — Telephone Encounter (Signed)
Transition Care Management Follow-up Telephone Call Date of discharge and from where: [facility]9 How have you been since you were released from the hospital? Doing ok in a SNF Any questions or concerns? No  Items Reviewed: Did the pt receive and understand the discharge instructions provided? Yes  Medications obtained and verified? No  Other? No  Any new allergies since your discharge? No  Dietary orders reviewed? No Do you have support at home? Yes     Follow up appointments reviewed:  PCP Hospital f/u appt confirmed? Yes  Scheduled to see PCP on OVER THE PHONE  @ . Specialist Hospital f/u appt confirmed? Yes  Scheduled to see Cardiology on 10/16 @ . Are transportation arrangements needed? No If their condition worsens, is the pt aware to call PCP or go to the Emergency Dept.? Yes Was the patient provided with contact information for the PCP's office or ED? Yes Was to pt encouraged to call back with questions or concerns? Yes

## 2022-12-16 ENCOUNTER — Encounter: Payer: Self-pay | Admitting: Family Medicine

## 2022-12-16 ENCOUNTER — Telehealth: Payer: Self-pay | Admitting: Family Medicine

## 2022-12-16 NOTE — Telephone Encounter (Signed)
Received call from [place] at [facility] to report patient having some breakdown on her bottom. Requesting script for Baza cream.  Please send to [facility] (pharmacy they send their scripts to) via fax at [phone]  Please advise Marcelino Duster with [facility] at [phone].

## 2022-12-31 ENCOUNTER — Telehealth: Payer: Self-pay | Admitting: Family Medicine

## 2022-12-31 NOTE — Telephone Encounter (Signed)
Prescription faxed to [facility] in [place] [phone]

## 2022-12-31 NOTE — Telephone Encounter (Signed)
Received call from [facility] with [facility]ie [facility] to request a written order to address hemorrhoid issue (patient sitting on hip to get up off of her bottom). Sandie unsure of what provider would order.  Please fax signed order to [phone], Attn: [facility]

## 2023-01-22 ENCOUNTER — Ambulatory Visit: Payer: Medicare Other | Admitting: Gastroenterology

## 2023-02-05 ENCOUNTER — Ambulatory Visit: Payer: Medicare Other | Attending: Internal Medicine | Admitting: Internal Medicine

## 2023-02-05 ENCOUNTER — Ambulatory Visit: Payer: Medicare Other

## 2023-02-05 VITALS — BP 131/79 | HR 94 | Ht 60.0 in | Wt 99.0 lb

## 2023-02-05 DIAGNOSIS — R55 Syncope and collapse: Secondary | ICD-10-CM | POA: Diagnosis present

## 2023-02-05 DIAGNOSIS — F0390 Unspecified dementia without behavioral disturbance: Secondary | ICD-10-CM | POA: Insufficient documentation

## 2023-02-05 NOTE — Progress Notes (Signed)
Cardiology Office Note  Date: 02/05/2023   ID: Dawn Conner, DOB 12/27/1927, MRN [id number]  PCP:  Donita Brooks, MD  Cardiologist:  None Electrophysiologist:  None   History of Present Illness: Dawn Conner is a 89 y.o. female known to have dementia, HTN, HLD was referred to cardiology clinic for evaluation of syncope.  Currently lives in a facility.  Accompanied by daughter.  Patient's daughter reported that herself and her mother were sitting outside on the patio in August 2024 performing some leg exercises when suddenly the patient slumped forward and lost consciousness for a few minutes.  There were no warning signs according to the patient's daughter.  Patient could not recall as she has dementia.  EKG in the ER showed NSR, no ischemia.  High-sensitivity troponins were mildly elevated, 22 and 26.  Otherwise, she does not have any other symptoms.  Echo and event monitor not performed.  Does not complain of any angina, DOE.  Past Medical History:  Diagnosis Date   Dementia (HCC)    Diverticulosis    Hyperlipidemia    Hypertension    Osteoporosis    Oth fracture of shaft of right humerus, init for opn fx 1990's   Renal insufficiency    Thyroid disease     Past Surgical History:  Procedure Laterality Date   ABDOMINAL HYSTERECTOMY  1972   APPENDECTOMY     cataract surgery     Early 2000's both eyes    CHOLECYSTECTOMY  1980's   COLONOSCOPY  03/10/2006   [place]; severe diverticulosis especially involving the sigmoid and to a lesser extent all other segments as well.  There was no evidence of neoplastic pathology. Recommended continuing Metamucil as well as Questran which appeared to keep her fairly regular. Consider repeat in 5 years.   OVARIAN CYST REMOVAL  09/2012   TONSILLECTOMY  age 72    Current Outpatient Medications  Medication Sig Dispense Refill   aspirin EC 81 MG tablet Take by mouth.     calcium carbonate (OS-CAL - DOSED IN MG OF ELEMENTAL CALCIUM) 1250  (500 Ca) MG tablet Take 1 tablet (1,250 mg total) by mouth daily with breakfast. 30 tablet 3   Cholecalciferol (VITAMIN D3) 25 MCG (1000 UT) CAPS Take 1-2 capsules (1,000-2,000 Units total) by mouth 2 (two) times daily. 1 tablet in morning and 2 tablets in evening     donepezil (ARICEPT) 10 MG tablet Take 1 tablet (10 mg total) by mouth at bedtime. 30 tablet 11   hydrocortisone (ANUSOL-HC) 2.5 % rectal cream Apply topically.     ibuprofen (ADVIL) 200 MG tablet Take 200 mg by mouth every 4 (four) hours as needed for mild pain (pain score 1-3) or moderate pain (pain score 4-6).     levothyroxine (SYNTHROID) 75 MCG tablet TAKE 1 TABLET BY MOUTH BEFORE BREAKFAST 90 tablet 3   loperamide (IMODIUM) 2 MG capsule Take by mouth.     Multiple Minerals-Vitamins (CALCIUM-MAGNESIUM-ZINC-D3 PO) Take 1-2 tablets by mouth daily.     Multiple Vitamins-Minerals (MULTIVITAMIN ADULTS 50+ PO) Take by mouth.     No current facility-administered medications for this visit.   Allergies:  Sulfa antibiotics   Social History: The patient  reports that she has never smoked. She has never used smokeless tobacco. She reports that she does not drink alcohol and does not use drugs.   Family History: The patient's family history is not on file.   ROS:  Please see the history of present illness. Otherwise, complete  review of systems is positive for none.  All other systems are reviewed and negative.   Physical Exam: VS:  Ht 5' (1.524 m)   Wt 99 lb (44.9 kg)   BMI 19.33 kg/m , BMI Body mass index is 19.33 kg/m.  Wt Readings from Last 3 Encounters:  02/05/23 99 lb (44.9 kg)  10/10/22 107 lb 12.8 oz (48.9 kg)  10/07/22 105 lb 9.6 oz (47.9 kg)    General: Patient appears comfortable at rest. HEENT: Conjunctiva and lids normal, oropharynx clear with moist mucosa. Neck: Supple, no elevated JVP or carotid bruits, no thyromegaly. Lungs: Clear to auscultation, nonlabored breathing at rest. Cardiac: Regular rate and rhythm,  no S3 or significant systolic murmur, no pericardial rub. Abdomen: Soft, nontender, no hepatomegaly, bowel sounds present, no guarding or rebound. Extremities: No pitting edema, distal pulses 2+. Skin: Warm and dry. Musculoskeletal: No kyphosis. Neuropsychiatric: Alert and oriented x1, only to self  Recent Labwork: 11/29/2022: ALT 23; AST 35; BUN 28; Creatinine, Ser 1.33; Hemoglobin 13.4; Magnesium 2.2; Platelets 158; Potassium 4.4; Sodium 134; TSH 3.049     Component Value Date/Time   CHOL 168 04/09/2021 1229   TRIG 108 04/09/2021 1229   HDL 56 04/09/2021 1229   CHOLHDL 3.0 04/09/2021 1229   LDLCALC 92 04/09/2021 1229     Assessment and Plan:  Syncope, unclear etiology: No warning signs prior to syncopal event in 11/2022.  Loss of consciousness lasted for about few minutes.  Witnessed syncopal event by the daughter.  No recurrent syncopal episodes since then.  EKG showed NSR, no ischemia.  Mildly elevated high-sensitivity troponins, 26 and 22.  Obtain 2D echocardiogram and 2-week event monitor.  Strongly encouraged p.o. hydration.  Dementia: Patient is not oriented to time and place.  She is only oriented to self.  Continue donepezil 10 mg nightly, follow-up with PCP.  I spent a total duration of 45 minutes reviewing the patient notes especially the recent ER visit from 11/2022, labs, imaging studies, face-to-face discussion with the patient and her daughter about her syncopal event, pathophysiology, evaluation, management, answered all her questions, ordering test/imaging studies and documenting the findings in the note.   Medication Adjustments/Labs and Tests Ordered: Current medicines are reviewed at length with the patient today.  Concerns regarding medicines are outlined above.    Disposition:  Follow up pending results  Signed, Carter Kaman Verne Spurr, MD, 02/05/2023 3:23 PM    [facility] [address] [place], [place] [address]

## 2023-02-05 NOTE — Patient Instructions (Signed)
Medication Instructions:  Your physician recommends that you continue on your current medications as directed. Please refer to the Current Medication list given to you today.  *If you need a refill on your cardiac medications before your next appointment, please call your pharmacy*   Lab Work: None If you have labs (blood work) drawn today and your tests are completely normal, you will receive your results only by: MyChart Message (if you have MyChart) OR A paper copy in the mail If you have any lab test that is abnormal or we need to change your treatment, we will call you to review the results.   Testing/Procedures: Your physician has requested that you have an echocardiogram. Echocardiography is a painless test that uses sound waves to create images of your heart. It provides your doctor with information about the size and shape of your heart and how well your heart's chambers and valves are working. This procedure takes approximately one hour. There are no restrictions for this procedure. Please do NOT wear cologne, perfume, aftershave, or lotions (deodorant is allowed). Please arrive 15 minutes prior to your appointment time.    Follow-Up: At [facility], you and your health needs are our priority.  As part of our continuing mission to provide you with exceptional heart care, we have created designated Provider Care Teams.  These Care Teams include your primary Cardiologist (physician) and Advanced Practice Providers (APPs -  Physician Assistants and Nurse Practitioners) who all work together to provide you with the care you need, when you need it.  We recommend signing up for the patient portal called "MyChart".  Sign up information is provided on this After Visit Summary.  MyChart is used to connect with patients for Virtual Visits (Telemedicine).  Patients are able to view lab/test results, encounter notes, upcoming appointments, etc.  Non-urgent messages can be sent to your  provider as well.   To learn more about what you can do with MyChart, go to [email or web address].    Your next appointment:   Follow up is pending testing results   Provider:   Luane School, MD    Other Instructions ZIO XT- Long Term Monitor Instructions   Your physician has requested you wear your ZIO patch monitor__14_____days.   This is a single patch monitor.  [employer] supplies one patch monitor per enrollment.  Additional stickers are not available.   Please do not apply patch if you will be having a Nuclear Stress Test, Echocardiogram, Cardiac CT, MRI, or Chest Xray during the time frame you would be wearing the monitor. The patch cannot be worn during these tests.  You cannot remove and re-apply the ZIO XT patch monitor.   Your ZIO patch monitor will be sent USPS Priority mail from [facility] directly to your home address. The monitor may also be mailed to a PO BOX if home delivery is not available.   It may take 3-5 days to receive your monitor after you have been enrolled.   Once you have received you monitor, please review enclosed instructions.  Your monitor has already been registered assigning a specific monitor serial # to you.   Applying the monitor   Shave hair from upper left chest.   Hold abrader disc by orange tab.  Rub abrader in 40 strokes over left upper chest as indicated in your monitor instructions.   Clean area with 4 enclosed alcohol pads .  Use all pads to assure are is cleaned thoroughly.  Let dry.  Apply patch as indicated in monitor instructions.  Patch will be place under collarbone on left side of chest with arrow pointing upward.   Rub patch adhesive wings for 2 minutes.Remove white label marked "1".  Remove white label marked "2".  Rub patch adhesive wings for 2 additional minutes.   While looking in a mirror, press and release button in center of patch.  A small green light will flash 3-4 times .  This will be your only indicator  the monitor has been turned on.     Do not shower for the first 24 hours.  You may shower after the first 24 hours.   Press button if you feel a symptom. You will hear a small click.  Record Date, Time and Symptom in the Patient Log Book.   When you are ready to remove patch, follow instructions on last 2 pages of Patient Log Book.  Stick patch monitor onto last page of Patient Log Book.   Place Patient Log Book in [place] box.  Use locking tab on box and tape box closed securely.  The [employer] and [employer] has [employer] on it.  Please place in mailbox as soon as possible.  Your physician should have your test results approximately 7 days after the monitor has been mailed back to [facility].   Call [facility] Customer Care at [phone] if you have questions regarding your ZIO XT patch monitor.  Call them immediately if you see an orange light blinking on your monitor.   If your monitor falls off in less than 4 days contact our Monitor department at [phone].  If your monitor becomes loose or falls off after 4 days call [employer] at [phone] for suggestions on securing your monitor.  \

## 2023-02-07 DIAGNOSIS — F0390 Unspecified dementia without behavioral disturbance: Secondary | ICD-10-CM | POA: Insufficient documentation

## 2023-02-07 DIAGNOSIS — R55 Syncope and collapse: Secondary | ICD-10-CM | POA: Insufficient documentation

## 2023-02-08 NOTE — Progress Notes (Unsigned)
Referring Provider: Donita Brooks, MD Primary Care Physician:  Donita Brooks, MD Primary GI Physician: Dr. Marletta Lor  Chief Complaint  Patient presents with   Follow-up    Several bowel incidents, has prn imodium, benefiber is scheduled. Had a pasty bm on 02/09/23.    HPI:   Dawn Conner is a 89 y.o. female presenting today for follow-up.  She has history of dementia, hypothyroidism, HTN, HLD, osteoporosis, and chronic history of intermittent diarrhea likely secondary to lactose intolerance and bile salt diarrhea s/p cholecystectomy, previously well controlled with tums before meals and a lactose free diet.  Previously, TSH  within normal limits on medication and celiac screen negative. Developed intermittent constipation with associated left-sided abdominal discomfort in the later part of 2022. This improved with benefiber.  Last seen in the office 10/07/2022.  Noted some increased frequency of diarrhea, now occurring a few days a week.  No dietary changes.  Weight was stable.  Occasional toilet tissue hematochezia related to frequent wiping/perianal irritation.  Noted some abdominal pain intermittently prior to bowel movements.  On days with diarrhea, stools start out small, [place] 5, then become large [address].  Up to 3 bowel movements a day when having diarrhea all occurring within 30 minutes.  May skip 1 day without a bowel movement after having diarrhea.  She was taking Tums twice daily at lunch and dinner, following lactose-free diet for the most part, 1 teaspoon of Benefiber and 1 tablet of Benefiber.  Daughter asking about trying gluten-free.  Additionally, daughter stated patient was reporting left-sided abdominal pain more frequently and was asking to go ahead and pursue imaging.  Recommendations included Tums 3 times daily before meals, increase Benefiber to 2 teaspoons twice daily, try IBgard, okay to try gluten-free, continue lactose-free, and arrange CT.   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 Notably, recent LFTs in June were entirely normal.  Discussed with patient's daughter the possibility of MRI versus triphasic CT to further evaluate findings.  Ultimately, considering underlying dementia and age, patient's daughter preferred not to put her through any additional testing as she would not want to put her mother through any sort of intervention.  Recommended monitoring for worsening abdominal pain, nausea, vomiting, jaundice.   Today: At [facility] for the last couple of months. Daughter still goes daily to see her.  Patient unable to give history due to dementia.  Spoke with care coordinator, Marcelino Duster, who states patient has been having intermittent frequent bowel movements.  States it depends on what she eats.  She may have somewhere between 2-5 bowel movements a day.  5 out of 7 days a week she tends to be having some between 3-5 bowel movements a day.  Symptoms are definitely worse with cabbage and beans.  She is strict lactose free.  Stool consistency can range from loose to pasty and sometimes watery.  However, over the last few days, stools have been more pasty. Just 2 BMs per day over the weekend. No brbpr or melena. Weight has been stable.   She was on antibiotics in August for UTI.   Benefiber just started 10/18. 1 tablet daily.  She is getting tums TID before meals.  Daughter reports diet wasn't always being  followed strictly, but they are doing better.     Currently under evaluation for syncope by cardiology.   Past Medical History:  Diagnosis Date   Dementia (HCC)    Diverticulosis    Hyperlipidemia    Hypertension    Osteoporosis    Oth fracture of shaft of right  humerus, init for opn fx 1990's   Renal insufficiency    Thyroid disease     Past Surgical History:  Procedure Laterality Date   ABDOMINAL HYSTERECTOMY  1972   APPENDECTOMY     cataract surgery     Early 2000's both eyes    CHOLECYSTECTOMY  1980's   COLONOSCOPY  03/10/2006   [place]; severe diverticulosis especially involving the sigmoid and to a lesser extent all other segments as well.  There was no evidence of neoplastic pathology. Recommended continuing Metamucil as well as Questran which appeared to keep her fairly regular. Consider repeat in 5 years.   OVARIAN CYST REMOVAL  09/2012   TONSILLECTOMY  age 48    Current Outpatient Medications  Medication Sig Dispense Refill   aspirin EC 81 MG tablet Take by mouth.     calcium carbonate (OS-CAL - DOSED IN MG OF ELEMENTAL CALCIUM) 1250 (500 Ca) MG tablet Take 1 tablet (1,250 mg total) by mouth daily with breakfast. 30 tablet 3   calcium carbonate (TUMS - DOSED IN MG ELEMENTAL CALCIUM) 500 MG chewable tablet Chew 1 tablet by mouth 3 (three) times daily with meals.     Cholecalciferol (VITAMIN D3) 25 MCG (1000 UT) CAPS Take 1-2 capsules (1,000-2,000 Units total) by mouth 2 (two) times daily. 1 tablet in morning and 2 tablets in evening     donepezil (ARICEPT) 10 MG tablet Take 1 tablet (10 mg total) by mouth at bedtime. 30 tablet 11   ibuprofen (ADVIL) 200 MG tablet Take 200 mg by mouth every 4 (four) hours as needed for mild pain (pain score 1-3) or moderate pain (pain score 4-6).     levothyroxine (SYNTHROID) 75 MCG tablet TAKE 1 TABLET BY MOUTH BEFORE BREAKFAST 90 tablet 3   loperamide (IMODIUM) 2 MG capsule Take by mouth.     Multiple Vitamins-Minerals (MULTIVITAMIN ADULTS 50+ PO) Take by mouth.     Zinc Oxide 12 % CREA Apply topically 2 (two) times daily.     Wheat Dextrin (BENEFIBER) CHEW Chew 1 tablet by mouth in the morning and at bedtime. 60 tablet 3   No current facility-administered medications for this visit.     Allergies as of 02/10/2023 - Review Complete 02/10/2023  Allergen Reaction Noted   Sulfa antibiotics  11/09/2018    Family History  Problem Relation Age of Onset   Colon cancer Neg Hx     Social History   Socioeconomic History   Marital status: Widowed    Spouse name: Not on file   Number of children: Not on file   Years of education: Not on file   Highest education level: Not on file  Occupational History   Not on file  Tobacco Use   Smoking status: Never   Smokeless tobacco: Never  Vaping Use   Vaping status: Never Used  Substance and Sexual Activity   Alcohol use: Never   Drug use: Never   Sexual activity: Not Currently  Other Topics Concern   Not on file  Social History Narrative   Lives with [name] and son in Social worker.    Social Determinants of Health   Financial Resource Strain:  Low Risk  (05/03/2022)   Overall Financial Resource Strain (CARDIA)    Difficulty of Paying Living Expenses: Not hard at all  Food Insecurity: No Food Insecurity (05/03/2022)   Hunger Vital Sign    Worried About Running Out of Food in the Last Year: Never true    Ran Out of Food in the Last Year: Never true  Transportation Needs: No Transportation Needs (05/03/2022)   PRAPARE - Administrator, Civil Service (Medical): No    Lack of Transportation (Non-Medical): No  Physical Activity: Insufficiently Active (05/03/2022)   Exercise Vital Sign    Days of Exercise per Week: 3 days    Minutes of Exercise per Session: 10 min  Stress: No Stress Concern Present (05/03/2022)   [employer] of Occupational Health - Occupational Stress Questionnaire    Feeling of Stress : Only a little  Social Connections: Moderately Isolated (05/03/2022)   Social Connection and Isolation Panel [NHANES]    Frequency of Communication with Friends and Family: Twice a week    Frequency of Social Gatherings with Friends and Family: Twice a week    Attends Religious Services: 1 to 4 times per  year    Active Member of [employer] or Organizations: No    Attends Banker Meetings: Never    Marital Status: Widowed    Review of Systems: Gen: Denies fever, chills, cold or flu like symptoms. GI: See HPI Heme: See HPI  Physical Exam: BP 124/78 (BP Location: Right Arm, Patient Position: Sitting, Cuff Size: Normal)   Pulse (!) 103   Temp 97.6 F (36.4 C) (Oral)   Ht 4\' 11"  (1.499 m)   Wt 98 lb 12.8 oz (44.8 kg)   SpO2 92%   BMI 19.96 kg/m  General:   Alert. No distress noted. Pleasant and cooperative.  Head:  Normocephalic and atraumatic. Eyes:  Conjuctiva clear without scleral icterus. Abdomen:  +BS, soft, non-tender and non-distended. No rebound or guarding. No HSM or masses noted. Msk:  Symmetrical without gross deformities. Normal posture. Extremities:  Without edema. Neurologic:  Alert and  oriented x4 Psych:  Normal mood and affect.    Assessment:  89 year old female with history of dementia, hypothyroidism, HTN, HLD, osteoporosis, and chronic history of intermittent diarrhea likely secondary to lactose intolerance and bile salt diarrhea s/p cholecystectomy, previously well controlled with tums before meals,  lactose free diet, and daily fiber. She is presenting today for follow-up. She has been residing at [facility] since August and staff there have noticed frequent loose/pasty and sometimes watery bowel movements 5 out of 7 days a week since being at the facility. This may be related to dietary changes as she was following a very strict diet when she was living with her daughter and facility notes her bowel frequency is very much influenced by what she eats, worse with cabbage and beans. She had also not been getting Benefiber until 10/18 and this had previously help with stool consistency. No alarm symptoms.   For now, I have recommended increasing benefieber, avoiding dairy, cruciferous vegetables, beans, and checking fecal elastase for EPI. Notably, prior TSH  wnl and celiac screen negative. If fecal elastase is normal and no improvement with benefiber/dietary changes I have asked for facility to notify me so I can arrange stool studies to rule out infection.    Plan:  Fecal elastase Increase Benefiber to 1 tablet twice daily. Strict lactose-free diet. Avoid cruciferous vegetables.  Limit beans. Requested facility to notify me if  persistent symptoms. Would recommend completing stool studies at that point.  Follow-up in 3 months or sooner if needed.   Ermalinda Memos, PA-C [facility] 02/10/2023

## 2023-02-10 ENCOUNTER — Ambulatory Visit (INDEPENDENT_AMBULATORY_CARE_PROVIDER_SITE_OTHER): Payer: Medicare Other | Admitting: Gastroenterology

## 2023-02-10 ENCOUNTER — Encounter: Payer: Self-pay | Admitting: Gastroenterology

## 2023-02-10 VITALS — BP 124/78 | HR 103 | Temp 97.6°F | Ht 59.0 in | Wt 98.8 lb

## 2023-02-10 DIAGNOSIS — R194 Change in bowel habit: Secondary | ICD-10-CM | POA: Insufficient documentation

## 2023-02-10 MED ORDER — BENEFIBER PO CHEW: 1.0000 | CHEWABLE_TABLET | Freq: Two times a day (BID) | ORAL | 3 refills | Status: DC

## 2023-02-12 ENCOUNTER — Telehealth: Payer: Self-pay | Admitting: Internal Medicine

## 2023-02-12 NOTE — Telephone Encounter (Signed)
Pt's caregiver is requesting a callback regarding pt taking monitor off on Sunday/Monday and now it's nowhere to be found so she'd like to know what they do next. Please advise

## 2023-02-12 NOTE — Telephone Encounter (Signed)
Spoke to [place] who stated that when pt left for a doctor appointment, she had the monitor on, when she came back from her appointment, the monitor was no longer on patient. Caregiver and pt's daughter have searched pt's room for monitor with no luck. Gave number to [employer] to caregiver to have new monitor over-nighted.   Caregiver had no further questions.

## 2023-02-18 DIAGNOSIS — R55 Syncope and collapse: Secondary | ICD-10-CM

## 2023-02-19 ENCOUNTER — Telehealth: Payer: Self-pay | Admitting: Internal Medicine

## 2023-02-19 NOTE — Telephone Encounter (Signed)
Spoke to [place] who stated that pt keeps pulling monitor off d/t dementia. Marcelino Duster advised to send monitor back.   Please advise.

## 2023-02-19 NOTE — Telephone Encounter (Signed)
Marcelino Duster is calling from [place] and states patient keeps taking monitor off. Marcelino Duster states pt has dementia and wants if she should continue to apply monitor or not.

## 2023-02-21 ENCOUNTER — Emergency Department (HOSPITAL_COMMUNITY): Payer: Medicare Other

## 2023-02-21 ENCOUNTER — Other Ambulatory Visit: Payer: Self-pay | Admitting: Gastroenterology

## 2023-02-21 ENCOUNTER — Other Ambulatory Visit: Payer: Self-pay

## 2023-02-21 ENCOUNTER — Encounter (HOSPITAL_COMMUNITY): Payer: Self-pay

## 2023-02-21 ENCOUNTER — Inpatient Hospital Stay (HOSPITAL_COMMUNITY)
Admission: EM | Admit: 2023-02-21 | Discharge: 2023-02-25 | DRG: 964 | Disposition: A | Discharge status: Home Health | Payer: Medicare Other | Source: Skilled Nursing Facility | Attending: Family Medicine | Admitting: Family Medicine

## 2023-02-21 DIAGNOSIS — I714 Abdominal aortic aneurysm, without rupture, unspecified: Secondary | ICD-10-CM | POA: Diagnosis present

## 2023-02-21 DIAGNOSIS — R2241 Localized swelling, mass and lump, right lower limb: Secondary | ICD-10-CM

## 2023-02-21 DIAGNOSIS — F0390 Unspecified dementia without behavioral disturbance: Secondary | ICD-10-CM | POA: Diagnosis not present

## 2023-02-21 DIAGNOSIS — Z23 Encounter for immunization: Secondary | ICD-10-CM

## 2023-02-21 DIAGNOSIS — M7989 Other specified soft tissue disorders: Secondary | ICD-10-CM | POA: Diagnosis present

## 2023-02-21 DIAGNOSIS — S3282XA Multiple fractures of pelvis without disruption of pelvic ring, initial encounter for closed fracture: Secondary | ICD-10-CM | POA: Diagnosis present

## 2023-02-21 DIAGNOSIS — W19XXXA Unspecified fall, initial encounter: Secondary | ICD-10-CM | POA: Diagnosis not present

## 2023-02-21 DIAGNOSIS — S065XAA Traumatic subdural hemorrhage with loss of consciousness status unknown, initial encounter: Principal | ICD-10-CM

## 2023-02-21 DIAGNOSIS — Z79890 Hormone replacement therapy (postmenopausal): Secondary | ICD-10-CM

## 2023-02-21 DIAGNOSIS — D72829 Elevated white blood cell count, unspecified: Secondary | ICD-10-CM | POA: Diagnosis present

## 2023-02-21 DIAGNOSIS — B961 Klebsiella pneumoniae [K. pneumoniae] as the cause of diseases classified elsewhere: Secondary | ICD-10-CM | POA: Diagnosis present

## 2023-02-21 DIAGNOSIS — E440 Moderate protein-calorie malnutrition: Secondary | ICD-10-CM | POA: Insufficient documentation

## 2023-02-21 DIAGNOSIS — N1832 Chronic kidney disease, stage 3b: Secondary | ICD-10-CM | POA: Diagnosis present

## 2023-02-21 DIAGNOSIS — E039 Hypothyroidism, unspecified: Secondary | ICD-10-CM | POA: Diagnosis present

## 2023-02-21 DIAGNOSIS — Z515 Encounter for palliative care: Secondary | ICD-10-CM | POA: Diagnosis not present

## 2023-02-21 DIAGNOSIS — S32502A Unspecified fracture of left pubis, initial encounter for closed fracture: Secondary | ICD-10-CM

## 2023-02-21 DIAGNOSIS — S0101XA Laceration without foreign body of scalp, initial encounter: Secondary | ICD-10-CM | POA: Diagnosis present

## 2023-02-21 DIAGNOSIS — I129 Hypertensive chronic kidney disease with stage 1 through stage 4 chronic kidney disease, or unspecified chronic kidney disease: Secondary | ICD-10-CM | POA: Diagnosis present

## 2023-02-21 DIAGNOSIS — Z681 Body mass index (BMI) 19 or less, adult: Secondary | ICD-10-CM

## 2023-02-21 DIAGNOSIS — Z7982 Long term (current) use of aspirin: Secondary | ICD-10-CM | POA: Diagnosis not present

## 2023-02-21 DIAGNOSIS — I10 Essential (primary) hypertension: Secondary | ICD-10-CM | POA: Diagnosis present

## 2023-02-21 DIAGNOSIS — W01198A Fall on same level from slipping, tripping and stumbling with subsequent striking against other object, initial encounter: Secondary | ICD-10-CM | POA: Diagnosis present

## 2023-02-21 DIAGNOSIS — K8681 Exocrine pancreatic insufficiency: Secondary | ICD-10-CM

## 2023-02-21 DIAGNOSIS — S0100XA Unspecified open wound of scalp, initial encounter: Secondary | ICD-10-CM | POA: Diagnosis present

## 2023-02-21 DIAGNOSIS — Z79899 Other long term (current) drug therapy: Secondary | ICD-10-CM | POA: Diagnosis not present

## 2023-02-21 DIAGNOSIS — S32501A Unspecified fracture of right pubis, initial encounter for closed fracture: Secondary | ICD-10-CM | POA: Diagnosis not present

## 2023-02-21 DIAGNOSIS — S329XXA Fracture of unspecified parts of lumbosacral spine and pelvis, initial encounter for closed fracture: Principal | ICD-10-CM | POA: Diagnosis present

## 2023-02-21 DIAGNOSIS — D631 Anemia in chronic kidney disease: Secondary | ICD-10-CM | POA: Diagnosis present

## 2023-02-21 DIAGNOSIS — F0150 Vascular dementia without behavioral disturbance: Secondary | ICD-10-CM | POA: Diagnosis present

## 2023-02-21 DIAGNOSIS — Z90710 Acquired absence of both cervix and uterus: Secondary | ICD-10-CM

## 2023-02-21 DIAGNOSIS — Y92009 Unspecified place in unspecified non-institutional (private) residence as the place of occurrence of the external cause: Secondary | ICD-10-CM | POA: Diagnosis not present

## 2023-02-21 DIAGNOSIS — S32501S Unspecified fracture of right pubis, sequela: Secondary | ICD-10-CM | POA: Diagnosis not present

## 2023-02-21 DIAGNOSIS — R627 Adult failure to thrive: Secondary | ICD-10-CM | POA: Diagnosis present

## 2023-02-21 DIAGNOSIS — N3000 Acute cystitis without hematuria: Secondary | ICD-10-CM | POA: Diagnosis not present

## 2023-02-21 DIAGNOSIS — Z882 Allergy status to sulfonamides status: Secondary | ICD-10-CM

## 2023-02-21 DIAGNOSIS — Z9049 Acquired absence of other specified parts of digestive tract: Secondary | ICD-10-CM

## 2023-02-21 DIAGNOSIS — K8689 Other specified diseases of pancreas: Secondary | ICD-10-CM | POA: Diagnosis present

## 2023-02-21 DIAGNOSIS — Z66 Do not resuscitate: Secondary | ICD-10-CM | POA: Diagnosis present

## 2023-02-21 DIAGNOSIS — E785 Hyperlipidemia, unspecified: Secondary | ICD-10-CM | POA: Diagnosis present

## 2023-02-21 DIAGNOSIS — N390 Urinary tract infection, site not specified: Secondary | ICD-10-CM | POA: Diagnosis present

## 2023-02-21 DIAGNOSIS — N179 Acute kidney failure, unspecified: Secondary | ICD-10-CM | POA: Diagnosis not present

## 2023-02-21 DIAGNOSIS — Z6821 Body mass index (BMI) 21.0-21.9, adult: Secondary | ICD-10-CM

## 2023-02-21 DIAGNOSIS — Z7189 Other specified counseling: Secondary | ICD-10-CM | POA: Diagnosis not present

## 2023-02-21 LAB — CBC WITH DIFFERENTIAL/PLATELET
Abs Immature Granulocytes: 0.11 10*3/uL — ABNORMAL HIGH (ref 0.00–0.07)
Basophils Absolute: 0 10*3/uL (ref 0.0–0.1)
Basophils Relative: 0 %
Eosinophils Absolute: 0.1 10*3/uL (ref 0.0–0.5)
Eosinophils Relative: 1 %
HCT: 37.9 % (ref 36.0–46.0)
Hemoglobin: 11.4 g/dL — ABNORMAL LOW (ref 12.0–15.0)
Immature Granulocytes: 1 %
Lymphocytes Relative: 11 %
Lymphs Abs: 1.6 10*3/uL (ref 0.7–4.0)
MCH: 26.8 pg (ref 26.0–34.0)
MCHC: 30.1 g/dL (ref 30.0–36.0)
MCV: 89 fL (ref 80.0–100.0)
Monocytes Absolute: 1 10*3/uL (ref 0.1–1.0)
Monocytes Relative: 7 %
Neutro Abs: 11.6 10*3/uL — ABNORMAL HIGH (ref 1.7–7.7)
Neutrophils Relative %: 80 %
Platelets: 233 10*3/uL (ref 150–400)
RBC: 4.26 MIL/uL (ref 3.87–5.11)
RDW: 15.7 % — ABNORMAL HIGH (ref 11.5–15.5)
WBC: 14.4 10*3/uL — ABNORMAL HIGH (ref 4.0–10.5)
nRBC: 0 % (ref 0.0–0.2)

## 2023-02-21 LAB — BASIC METABOLIC PANEL
Anion gap: 7 (ref 5–15)
BUN: 26 mg/dL — ABNORMAL HIGH (ref 8–23)
CO2: 25 mmol/L (ref 22–32)
Calcium: 8.5 mg/dL — ABNORMAL LOW (ref 8.9–10.3)
Chloride: 104 mmol/L (ref 98–111)
Creatinine, Ser: 1.32 mg/dL — ABNORMAL HIGH (ref 0.44–1.00)
GFR, Estimated: 38 mL/min — ABNORMAL LOW (ref >60)
Glucose, Bld: 151 mg/dL — ABNORMAL HIGH (ref 70–99)
Potassium: 3.9 mmol/L (ref 3.5–5.1)
Sodium: 136 mmol/L (ref 135–145)

## 2023-02-21 LAB — PANCREATIC ELASTASE, FECAL: Pancreatic Elastase-1, Stool: 168 ug/g — ABNORMAL LOW

## 2023-02-21 MED ORDER — OXYCODONE HCL 5 MG PO TABS
5.0000 mg | ORAL_TABLET | Freq: Once | ORAL | Status: AC
  Administered 2023-02-21: 5 mg via ORAL
  Filled 2023-02-21: qty 1

## 2023-02-21 MED ORDER — ACETAMINOPHEN 325 MG PO TABS: 650.0000 mg | ORAL_TABLET | Freq: Four times a day (QID) | ORAL | Status: DC | PRN

## 2023-02-21 MED ORDER — TETANUS-DIPHTH-ACELL PERTUSSIS 5-2.5-18.5 LF-MCG/0.5 IM SUSY
0.5000 mL | PREFILLED_SYRINGE | Freq: Once | INTRAMUSCULAR | Status: AC
  Administered 2023-02-21: 0.5 mL via INTRAMUSCULAR
  Filled 2023-02-21: qty 0.5

## 2023-02-21 MED ORDER — ONDANSETRON HCL 4 MG/2ML IJ SOLN: 4.0000 mg | Freq: Four times a day (QID) | INTRAMUSCULAR | Status: DC | PRN

## 2023-02-21 MED ORDER — LEVOTHYROXINE SODIUM 75 MCG PO TABS
75.0000 ug | ORAL_TABLET | Freq: Every day | ORAL | Status: DC
  Administered 2023-02-22 – 2023-02-25 (×4): 75 ug via ORAL
  Filled 2023-02-21 (×4): qty 1

## 2023-02-21 MED ORDER — DONEPEZIL HCL 5 MG PO TABS
10.0000 mg | ORAL_TABLET | Freq: Every day | ORAL | Status: DC
  Administered 2023-02-22 – 2023-02-24 (×3): 10 mg via ORAL
  Filled 2023-02-21 (×3): qty 2

## 2023-02-21 MED ORDER — OXYCODONE-ACETAMINOPHEN 5-325 MG PO TABS
1.0000 | ORAL_TABLET | ORAL | Status: DC | PRN
  Administered 2023-02-22: 1 via ORAL
  Filled 2023-02-21: qty 1

## 2023-02-21 MED ORDER — PANCRELIPASE (LIP-PROT-AMYL) 36000-114000 UNITS PO CPEP
36000.0000 [IU] | ORAL_CAPSULE | Freq: Three times a day (TID) | ORAL | Status: DC
  Administered 2023-02-22 – 2023-02-25 (×10): 36000 [IU] via ORAL
  Filled 2023-02-21 (×11): qty 1

## 2023-02-21 MED ORDER — CALCIUM CARBONATE 1250 (500 CA) MG PO TABS
1.0000 | ORAL_TABLET | Freq: Every day | ORAL | Status: DC
  Administered 2023-02-22 – 2023-02-24 (×3): 1250 mg via ORAL
  Filled 2023-02-21 (×4): qty 1

## 2023-02-21 MED ORDER — ACETAMINOPHEN 500 MG PO TABS
1000.0000 mg | ORAL_TABLET | Freq: Once | ORAL | Status: AC
  Administered 2023-02-21: 1000 mg via ORAL
  Filled 2023-02-21: qty 2

## 2023-02-21 MED ORDER — AMLODIPINE BESYLATE 5 MG PO TABS: 5.0000 mg | ORAL_TABLET | Freq: Every day | ORAL | Status: DC

## 2023-02-21 MED ORDER — LIDOCAINE HCL (PF) 1 % IJ SOLN
5.0000 mL | Freq: Once | INTRAMUSCULAR | Status: AC
  Administered 2023-02-21: 5 mL via INTRADERMAL
  Filled 2023-02-21: qty 5

## 2023-02-21 MED ORDER — PANCRELIPASE (LIP-PROT-AMYL) 36000-114000 UNITS PO CPEP: 36000.0000 [IU] | ORAL_CAPSULE | Freq: Three times a day (TID) | ORAL | 3 refills | Status: DC

## 2023-02-21 MED ORDER — ACETAMINOPHEN 650 MG RE SUPP
650.0000 mg | Freq: Four times a day (QID) | RECTAL | Status: DC | PRN
Start: 2023-02-21 — End: 2023-02-22

## 2023-02-21 MED ORDER — ONDANSETRON HCL 4 MG PO TABS: 4.0000 mg | ORAL_TABLET | Freq: Four times a day (QID) | ORAL | Status: DC | PRN

## 2023-02-21 NOTE — H&P (Signed)
History and Physical    Patient: Dawn Conner JYN:[id number] DOB: 02-18-1928 DOA: 02/21/2023 DOS: the patient was seen and examined on 02/21/2023 PCP: Donita Brooks, MD  Patient coming from: SN[facility]  Chief Complaint:  Chief Complaint  Patient presents with   Fall   HPI: Dawn Conner is a 89 y.o. female with medical history significant of tension, hyperlipidemia, hypothyroidism, dementia, osteoporosis and CKD 3B who presents to the emergency department due to a fall from standing position today.  Patient was unable to provide history possibly due to underlying dementia history was obtained from ED physician and ED medical record.  Patient was unable to provide history, history was obtained from ED physician and ED medical record, per report patient fell backwards and hit her head.  She complains of left hip pain and headache after the fall.  No further detailed history was obtainable at this time.  ED Course:  In the emergency department, BP was 152/107, other vital signs were within normal range.  Workup in the ED showed leukocytosis and normocytic anemia.  BMP was normal except for blood glucose of 151, BUN/creatinine 26/1.32 (baseline creatinine at 1.0-1.2). CT head without contrast showed trace left parafalcine subdural hematoma, 2 mm in thickness.  No brain contact.  Left posterior scalp wound without fracture.  Negative for cervical spine fracture or subluxation CT pelvis without contrast showed pelvic bone fractures with moderate hematoma along the pelvic floor anterior and to the left side of the urinary bladder. Patient was treated with Tylenol, oxycodone 5 mg orally x 1 was given. NP Cosentino who was covering for neurosurgery was consulted and recommended repeat CT scan in 4 to 6 hours. Dr. Aundria Rud (orthopedics) was consulted and recommended weightbearing as tolerated. Continue to have significant pain and which was worse on bearing weight, it was then decided for patient to be admitted.   TRH was called to admit patient for further evaluation and management.   Review of Systems: Review of systems as noted in the HPI. All other systems reviewed and are negative.   Past Medical History:  Diagnosis Date   Dementia (HCC)    Diverticulosis    Hyperlipidemia    Hypertension    Osteoporosis    Oth fracture of shaft of right humerus, init for opn fx 1990's   Renal insufficiency    Thyroid disease    Past Surgical History:  Procedure Laterality Date   ABDOMINAL HYSTERECTOMY  1972   APPENDECTOMY     cataract surgery     Early 2000's both eyes    CHOLECYSTECTOMY  1980's   COLONOSCOPY  03/10/2006   [place]; severe diverticulosis especially involving the sigmoid and to a lesser extent all other segments as well.  There was no evidence of neoplastic pathology. Recommended continuing Metamucil as well as Questran which appeared to keep her fairly regular. Consider repeat in 5 years.   OVARIAN CYST REMOVAL  09/2012   TONSILLECTOMY  age 43    Social History:  reports that she has never smoked. She has never used smokeless tobacco. She reports that she does not drink alcohol and does not use drugs.   Allergies  Allergen Reactions   Sulfa Antibiotics     Family History  Problem Relation Age of Onset   Colon cancer Neg Hx      Prior to Admission medications   Medication Sig Start Date End Date Taking? Authorizing Provider  aspirin EC 81 MG tablet Take by mouth. 12/23/19   [provider]  calcium carbonate (OS-CAL - DOSED IN MG OF ELEMENTAL CALCIUM) 1250 (500 Ca) MG tablet Take 1 tablet (1,250 mg total) by mouth daily with breakfast. 11/19/22   Donita Brooks, MD  calcium carbonate (TUMS - DOSED IN MG ELEMENTAL CALCIUM) 500 MG chewable tablet Chew 1 tablet by mouth 3 (three) times daily with meals.    [provider]  Cholecalciferol (VITAMIN D3) 25 MCG (1000 UT) CAPS Take 1-2 capsules (1,000-2,000 Units total) by mouth 2 (two) times daily. 1 tablet  in morning and 2 tablets in evening 09/18/21   Johnson, Clanford L, MD  donepezil (ARICEPT) 10 MG tablet Take 1 tablet (10 mg total) by mouth at bedtime. 10/10/22   Donita Brooks, MD  ibuprofen (ADVIL) 200 MG tablet Take 200 mg by mouth every 4 (four) hours as needed for mild pain (pain score 1-3) or moderate pain (pain score 4-6).    [provider]  levothyroxine (SYNTHROID) 75 MCG tablet TAKE 1 TABLET BY MOUTH BEFORE BREAKFAST 01/04/22   Donita Brooks, MD  lipase/protease/amylase (CREON) 36000 UNITS CPEP capsule Take 1 capsule (36,000 Units total) by mouth 3 (three) times daily with meals. Take with first bite of food. 02/21/23   Letta Median, PA-C  loperamide (IMODIUM) 2 MG capsule Take by mouth. 12/23/19   [provider]  Multiple Vitamins-Minerals (MULTIVITAMIN ADULTS 50+ PO) Take by mouth.    [provider]  Wheat Dextrin (BENEFIBER) CHEW Chew 1 tablet by mouth in the morning and at bedtime. 02/10/23   Letta Median, PA-C  Zinc Oxide 12 % CREA Apply topically 2 (two) times daily.    [provider]    Physical Exam: BP (!) 104/55   Pulse 62   Temp 98.8 F (37.1 C) (Oral)   Resp 18   Ht 4\' 11"  (1.499 m)   Wt 44.8 kg   SpO2 96%   BMI 19.96 kg/m   General: 89 y.o. year-old female well developed well nourished in no acute distress.  Alert and oriented x3. HEENT: Laceration repair noted in posterior skull, EOMI Neck: Supple, trachea medial Cardiovascular: Regular rate and rhythm with no rubs or gallops.  No thyromegaly or JVD noted.  No lower extremity edema. 2/4 pulses in all 4 extremities. Respiratory: Clear to auscultation with no wheezes or rales. Good inspiratory effort. Abdomen: Soft, nontender nondistended with normal bowel sounds x4 quadrants. Muskuloskeletal: Limited ROM of left hip due to pain. RLE swelling > LLE.  Neuro: CN II-XII intact, strength 5/5 x 4, sensation, reflexes intact Skin: Left forearm abrasion noted.  No  ulcerative lesions noted or rashes Psychiatry: Judgement and insight appear normal. Mood is appropriate for condition and setting          Labs on Admission:  Basic Metabolic Panel: Recent Labs  Lab 02/21/23 1553  NA 136  K 3.9  CL 104  CO2 25  GLUCOSE 151*  BUN 26*  CREATININE 1.32*  CALCIUM 8.5*   Liver Function Tests: No results for input(s): "AST", "ALT", "ALKPHOS", "BILITOT", "PROT", "ALBUMIN" in the last 168 hours. No results for input(s): "LIPASE", "AMYLASE" in the last 168 hours. No results for input(s): "AMMONIA" in the last 168 hours. CBC: Recent Labs  Lab 02/21/23 1553  WBC 14.4*  NEUTROABS 11.6*  HGB 11.4*  HCT 37.9  MCV 89.0  PLT 233   Cardiac Enzymes: No results for input(s): "CKTOTAL", "CKMB", "CKMBINDEX", "TROPONINI" in the last 168 hours.  BNP (last 3 results) No results for  input(s): "BNP" in the last 8760 hours.  ProBNP (last 3 results) No results for input(s): "PROBNP" in the last 8760 hours.  CBG: No results for input(s): "GLUCAP" in the last 168 hours.  Radiological Exams on Admission: CT Head Wo Contrast  Result Date: 02/21/2023 CLINICAL DATA:  4-6 hour follow-up for subdural hematoma EXAM: CT HEAD WITHOUT CONTRAST TECHNIQUE: Contiguous axial images were obtained from the base of the skull through the vertex without intravenous contrast. RADIATION DOSE REDUCTION: This exam was performed according to the departmental dose-optimization program which includes automated exposure control, adjustment of the mA and/or kV according to patient size and/or use of iterative reconstruction technique. COMPARISON:  02/21/2023 12:33 p.m. FINDINGS: Brain: Redemonstrated trace left parafalcine subdural hematoma along the anterior falx, which measures up to 2 mm, unchanged from the prior exam. No significant mass effect. No new extra-axial collection is seen. No evidence of acute infarction, parenchymal hemorrhage, mass, mass effect, or midline shift. No  hydrocephalus. Periventricular white matter changes, likely the sequela of chronic small vessel ischemic disease. Vascular: No hyperdense vessel. Atherosclerotic calcifications in the intracranial carotid and vertebral arteries. Skull: Negative for fracture or focal lesion. Sinuses/Orbits: No acute finding. Other: The mastoid air cells are well aerated. IMPRESSION: Unchanged trace left parafalcine subdural hematoma along the anterior falx. No significant mass effect. No new extra-axial collection. Electronically Signed   By: Wiliam Ke M.D.   On: 02/21/2023 18:57   CT PELVIS WO CONTRAST  Result Date: 02/21/2023 CLINICAL DATA:  Pelvic fracture. EXAM: CT PELVIS WITHOUT CONTRAST TECHNIQUE: Multidetector CT imaging of the pelvis was performed following the standard protocol without intravenous contrast. RADIATION DOSE REDUCTION: This exam was performed according to the departmental dose-optimization program which includes automated exposure control, adjustment of the mA and/or kV according to patient size and/or use of iterative reconstruction technique. COMPARISON:  Left hip radiograph dated 02/21/2023. FINDINGS: Evaluation of this exam is limited in the absence of intravenous contrast. Urinary Tract: The urinary bladder is partially distended and grossly unremarkable. Bowel: There is severe sigmoid diverticulosis with muscular hypertrophy. Additional scattered colonic diverticula. No bowel dilatation in the pelvis. Vascular/Lymphatic: Advanced aortoiliac atherosclerotic disease. There is a 4 cm fusiform distal abdominal aortic aneurysm. No pelvic adenopathy. Reproductive:  Hysterectomy. Other: Moderate amount of hematoma within the lower pelvis anterior and to the left of the bladder. Musculoskeletal: There is a comminuted and displaced fracture of the left superior pubic ramus close to the symphysis previous as well as mildly displaced fracture of the left inferior pubic ramus. Nondisplaced fracture of the  right superior pubic ramus as well as nondisplaced fractures of the left sacral al a. IMPRESSION: 1. Pelvic bone fractures as above with moderate hematoma along the pelvic floor anterior and to the left of the urinary bladder. 2. Severe sigmoid diverticulosis. 3. A 4 cm fusiform distal abdominal aortic aneurysm. Recommend follow-up CT or MR as appropriate in 12 months and referral to or continued care with vascular specialist. (Ref.: J Vasc Surg. 2018; 67:2-77 and J Am Coll Radiol 2013;10(10):789-794.) 4.  Aortic Atherosclerosis (ICD10-I70.0). Electronically Signed   By: Elgie Collard M.D.   On: 02/21/2023 17:49   DG Hip Unilat With Pelvis 2-3 Views Left  Result Date: 02/21/2023 CLINICAL DATA:  Hip pain.  Fall EXAM: DG HIP (WITH OR WITHOUT PELVIS) 2-3V LEFT COMPARISON:  None Available. FINDINGS: Severe osteopenia. Sclerosis and joint space loss along the sacroiliac joints. Scattered vascular calcifications. There is a comminuted fracture along the pubic bone towards the  margin of the symphysis but extending along the superior and inferior pubic ramus. IMPRESSION: Comminuted fracture along the left pubic bone towards the symphysis with some involvement of the superior and inferior pubic ramus. Severe osteopenia.  Degenerative change Electronically Signed   By: Karen Kays M.D.   On: 02/21/2023 15:48   CT Head Wo Contrast  Result Date: 02/21/2023 CLINICAL DATA:  Minor head trauma EXAM: CT HEAD WITHOUT CONTRAST CT CERVICAL SPINE WITHOUT CONTRAST TECHNIQUE: Multidetector CT imaging of the head and cervical spine was performed following the standard protocol without intravenous contrast. Multiplanar CT image reconstructions of the cervical spine were also generated. RADIATION DOSE REDUCTION: This exam was performed according to the departmental dose-optimization program which includes automated exposure control, adjustment of the mA and/or kV according to patient size and/or use of iterative reconstruction  technique. COMPARISON:  11/29/2022 FINDINGS: CT HEAD FINDINGS Brain: Trace left parafalcine subdural hematoma anteriorly, at the 2 mm in thickness. No brain mass effect. Atrophy of the brain, especially advanced at the temporal lobes. Chronic small vessel ischemia which is confluent in the deep white matter. No acute infarct or hydrocephalus. No masslike finding Vascular: No hyperdense vessel or unexpected calcification. Skull: Posterior scalp wound on the left without calvarial fracture or foreign body Sinuses/Orbits: No evidence of injury CT CERVICAL SPINE FINDINGS Alignment: Normal. Skull base and vertebrae: No acute fracture. No primary bone lesion or focal pathologic process. Soft tissues and spinal canal: No prevertebral fluid or swelling. No visible canal hematoma. Disc levels:  No evidence of impingement Upper chest: Clear apical lungs Critical Value/emergent results were called by telephone at the time of interpretation on 02/21/2023 at 12:41 pm to provider Alvino Blood , who verbally acknowledged these results. IMPRESSION: 1. Trace left parafalcine subdural hematoma, 2 mm in thickness. No brain contact. 2. Left posterior scalp wound without fracture. 3. Negative for cervical spine fracture or subluxation. Electronically Signed   By: Tiburcio Pea M.D.   On: 02/21/2023 12:43   CT Cervical Spine Wo Contrast  Result Date: 02/21/2023 CLINICAL DATA:  Minor head trauma EXAM: CT HEAD WITHOUT CONTRAST CT CERVICAL SPINE WITHOUT CONTRAST TECHNIQUE: Multidetector CT imaging of the head and cervical spine was performed following the standard protocol without intravenous contrast. Multiplanar CT image reconstructions of the cervical spine were also generated. RADIATION DOSE REDUCTION: This exam was performed according to the departmental dose-optimization program which includes automated exposure control, adjustment of the mA and/or kV according to patient size and/or use of iterative reconstruction technique.  COMPARISON:  11/29/2022 FINDINGS: CT HEAD FINDINGS Brain: Trace left parafalcine subdural hematoma anteriorly, at the 2 mm in thickness. No brain mass effect. Atrophy of the brain, especially advanced at the temporal lobes. Chronic small vessel ischemia which is confluent in the deep white matter. No acute infarct or hydrocephalus. No masslike finding Vascular: No hyperdense vessel or unexpected calcification. Skull: Posterior scalp wound on the left without calvarial fracture or foreign body Sinuses/Orbits: No evidence of injury CT CERVICAL SPINE FINDINGS Alignment: Normal. Skull base and vertebrae: No acute fracture. No primary bone lesion or focal pathologic process. Soft tissues and spinal canal: No prevertebral fluid or swelling. No visible canal hematoma. Disc levels:  No evidence of impingement Upper chest: Clear apical lungs Critical Value/emergent results were called by telephone at the time of interpretation on 02/21/2023 at 12:41 pm to provider Alvino Blood , who verbally acknowledged these results. IMPRESSION: 1. Trace left parafalcine subdural hematoma, 2 mm in thickness. No brain contact. 2.  Left posterior scalp wound without fracture. 3. Negative for cervical spine fracture or subluxation. Electronically Signed   By: Tiburcio Pea M.D.   On: 02/21/2023 12:43    EKG: I independently viewed the EKG done and my findings are as followed: EKG was not done in the ED  Assessment/Plan Present on Admission:  Closed displaced fracture of pelvis (HCC)  Leukocytosis  Acquired hypothyroidism  Dementia without behavioral disturbance (HCC)  Principal Problem:   Closed displaced fracture of pelvis (HCC) Active Problems:   Acquired hypothyroidism   Leukocytosis   Fall at home, initial encounter   Dementia without behavioral disturbance (HCC)   Subdural hematoma (HCC)   Scalp wound   AAA (abdominal aortic aneurysm) (HCC)   Pancreatic insufficiency   Chronic kidney disease, stage 3b (HCC)    Localized swelling of right lower extremity  Closed displaced fracture of the pelvis CT pelvis without contrast showed pelvic bone fractures  Orthopedic was consulted and no indication for any surgical intervention, but to continue to bear weight as tolerated. Continue Percocet as needed Continue Os-Cal Continue fall precaution Continue PT/OT eval and treat  Subdural hematoma CT head without contrast showed trace left parafalcine subdural hematoma, 2 mm in thickness Neurosurgery NP was consulted and recommended a repeat CT of head in 4 to 6 hours.  This was done and there was no change in patient's subdural hematoma. Continue to monitor patient  Fall at home Continue fall precaution Continue PT/OT eval and treat  Scalp wound Patient sustained laceration to posterior scalp due to fall, this was repaired. Continue wound care  Leukocytosis (possibly reactive) WBC 14.4, no obvious sign of any acute infectious process at this time Continue to monitor WBCs with morning labs  RLE swelling > LLE Right lower extremity ultrasound will be done in the morning  Pancreatic insufficiency Continue Creon  AAA CT pelvis without contrast showed a 4 cm.  Fusiform distal abdominal aortic aneurysm Follow-up CT or MRI in 12 months and referral to all continued care with vascular specialist was recommended by radiologist  CKD 3B BUN/creatinine 26/1.32 (baseline creatinine at 1.0-1.2). Renally adjust medications, avoid nephrotoxic agents/dehydration/hypotension  Dementia Continue Donepezil  Acquired hypothyroidism Continue Synthroid  DVT prophylaxis: SCDs   Advance Care Planning: CODE STATUS: DNR  Consults: PT/OT  Family Communication: Daughter at bedside (all questions answered to satisfaction)  Severity of Illness: The appropriate patient status for this patient is INPATIENT. Inpatient status is judged to be reasonable and necessary in order to provide the required intensity of  service to ensure the patient's safety. The patient's presenting symptoms, physical exam findings, and initial radiographic and laboratory data in the context of their chronic comorbidities is felt to place them at high risk for further clinical deterioration. Furthermore, it is not anticipated that the patient will be medically stable for discharge from the hospital within 2 midnights of admission.   * I certify that at the point of admission it is my clinical judgment that the patient will require inpatient hospital care spanning beyond 2 midnights from the point of admission due to high intensity of service, high risk for further deterioration and high frequency of surveillance required.*  Author: Frankey Shown, DO 02/21/2023 9:27 PM  For on call review [email or web address].

## 2023-02-21 NOTE — ED Provider Notes (Signed)
[facility] [place] [facility]PARTMENT AT [facility] Provider Note  CSN: [id number] Arrival date & time: 02/21/23 1055  Chief Complaint(s) Fall  HPI Dawn Conner is a 89 y.o. female with history of dementia presenting to the emergency department with fall.  Patient reportedly had a fall from a standing position.  She fell backwards and hit her head.  Patient reports headache, left hip pain.  Denies pain elsewhere.  Patient does not remember what happened but also does not know why she is in the hospital, does not know who brought her to the hospital, and is unable to provide much additional history.  This was witnessed apparently at her skilled nursing facility.  No reported loss of consciousness.  History limited due to dementia.   Past Medical History Past Medical History:  Diagnosis Date   Dementia (HCC)    Diverticulosis    Hyperlipidemia    Hypertension    Osteoporosis    Oth fracture of shaft of right humerus, init for opn fx 1990's   Renal insufficiency    Thyroid disease    Patient Active Problem List   Diagnosis Date Noted   Increased bowel frequency 02/10/2023   Syncope and collapse 02/07/2023   Dementia (HCC) 02/07/2023   Generalized abdominal pain 10/07/2022   Rectal bleeding 07/08/2022   Tinea 07/08/2022   Fall 03/19/2022   Perianal irritation 12/20/2021   Prediabetes 09/18/2021   SIRS (systemic inflammatory response syndrome) (HCC) 09/17/2021   Failure to thrive in adult 09/17/2021   Fever 09/17/2021   Leukocytosis 09/17/2021   Generalized weakness 09/17/2021   Chronic confusional state 09/17/2021   Hyperglycemia 09/17/2021   Alternating constipation and diarrhea 01/22/2021   Vascular dementia (HCC) 10/17/2019   Diarrhea 05/19/2019   Essential hypertension 11/09/2018   Osteoporosis 11/09/2018   Hypothyroidism 11/09/2018   Hyperlipidemia 11/09/2018   Chronic diarrhea 11/09/2018   Home Medication(s) Prior to Admission medications   Medication Sig  Start Date End Date Taking? Authorizing Provider  aspirin EC 81 MG tablet Take by mouth. 12/23/19   [provider]  calcium carbonate (OS-CAL - DOSED IN MG OF ELEMENTAL CALCIUM) 1250 (500 Ca) MG tablet Take 1 tablet (1,250 mg total) by mouth daily with breakfast. 11/19/22   Donita Brooks, MD  calcium carbonate (TUMS - DOSED IN MG ELEMENTAL CALCIUM) 500 MG chewable tablet Chew 1 tablet by mouth 3 (three) times daily with meals.    [provider]  Cholecalciferol (VITAMIN D3) 25 MCG (1000 UT) CAPS Take 1-2 capsules (1,000-2,000 Units total) by mouth 2 (two) times daily. 1 tablet in morning and 2 tablets in evening 09/18/21   Johnson, Clanford L, MD  donepezil (ARICEPT) 10 MG tablet Take 1 tablet (10 mg total) by mouth at bedtime. 10/10/22   Donita Brooks, MD  ibuprofen (ADVIL) 200 MG tablet Take 200 mg by mouth every 4 (four) hours as needed for mild pain (pain score 1-3) or moderate pain (pain score 4-6).    [provider]  levothyroxine (SYNTHROID) 75 MCG tablet TAKE 1 TABLET BY MOUTH BEFORE BREAKFAST 01/04/22   Donita Brooks, MD  lipase/protease/amylase (CREON) 36000 UNITS CPEP capsule Take 1 capsule (36,000 Units total) by mouth 3 (three) times daily with meals. Take with first bite of food. 02/21/23   Letta Median, PA-C  loperamide (IMODIUM) 2 MG capsule Take by mouth. 12/23/19   [provider]  Multiple Vitamins-Minerals (MULTIVITAMIN ADULTS 50+ PO) Take by mouth.    [provider]  Wheat Dextrin (BENEFIBER) CHEW Chew 1 tablet by mouth in the morning and at bedtime. 02/10/23   Letta Median, PA-C  Zinc Oxide 12 % CREA Apply topically 2 (two) times daily.    [provider]                                                                                                                                    Past Surgical History Past Surgical History:  Procedure Laterality Date   ABDOMINAL HYSTERECTOMY  1972   APPENDECTOMY      cataract surgery     Early 2000's both eyes    CHOLECYSTECTOMY  1980's   COLONOSCOPY  03/10/2006   [place]; severe diverticulosis especially involving the sigmoid and to a lesser extent all other segments as well.  There was no evidence of neoplastic pathology. Recommended continuing Metamucil as well as Questran which appeared to keep her fairly regular. Consider repeat in 5 years.   OVARIAN CYST REMOVAL  09/2012   TONSILLECTOMY  age 62   Family History Family History  Problem Relation Age of Onset   Colon cancer Neg Hx     Social History Social History   Tobacco Use   Smoking status: Never   Smokeless tobacco: Never  Vaping Use   Vaping status: Never Used  Substance Use Topics   Alcohol use: Never   Drug use: Never   Allergies Sulfa antibiotics  Review of Systems Review of Systems  All other systems reviewed and are negative.   Physical Exam Vital Signs  I have reviewed the triage vital signs BP (!) 152/107 (BP Location: Left Arm)   Pulse 87   Temp 97.7 F (36.5 C) (Oral)   Resp 18   Ht 4\' 11"  (1.499 m)   Wt 44.8 kg   SpO2 96%   BMI 19.96 kg/m  Physical Exam Vitals and nursing note reviewed.  Constitutional:      General: She is not in acute distress.    Appearance: She is well-developed.  HENT:     Head: Normocephalic.     Comments: 2 cm laceration to the posterior scalp    Mouth/Throat:     Mouth: Mucous membranes are moist.  Eyes:     Pupils: Pupils are equal, round, and reactive to light.  Cardiovascular:     Rate and Rhythm: Normal rate and regular rhythm.     Heart sounds: No murmur heard. Pulmonary:     Effort: Pulmonary effort is normal. No respiratory distress.     Breath sounds: Normal breath sounds.  Abdominal:     General: Abdomen is flat.     Palpations: Abdomen is soft.     Tenderness: There is no abdominal tenderness.  Musculoskeletal:     Comments: No midline C, T, L-spine tenderness.  Full active range of motion of the  bilateral upper extremities with no focal tenderness or deformity.  Painful range  of motion of the left hip with no tenderness over the left knee, ankle or foot.  Full active range of motion of the right lower extremity.  No chest wall tenderness or crepitus.  Skin:    General: Skin is warm and dry.     Comments: ~3cm skin tear left forearm   Neurological:     General: No focal deficit present.     Mental Status: She is alert. Mental status is at baseline.  Psychiatric:        Mood and Affect: Mood normal.        Behavior: Behavior normal.     ED Results and Treatments Labs (all labs ordered are listed, but only abnormal results are displayed) Labs Reviewed  BASIC METABOLIC PANEL  CBC WITH DIFFERENTIAL/PLATELET                                                                                                                          Radiology DG Hip Unilat With Pelvis 2-3 Views Left  Result Date: 02/21/2023 CLINICAL DATA:  Hip pain.  Fall EXAM: DG HIP (WITH OR WITHOUT PELVIS) 2-3V LEFT COMPARISON:  None Available. FINDINGS: Severe osteopenia. Sclerosis and joint space loss along the sacroiliac joints. Scattered vascular calcifications. There is a comminuted fracture along the pubic bone towards the margin of the symphysis but extending along the superior and inferior pubic ramus. IMPRESSION: Comminuted fracture along the left pubic bone towards the symphysis with some involvement of the superior and inferior pubic ramus. Severe osteopenia.  Degenerative change Electronically Signed   By: Karen Kays M.D.   On: 02/21/2023 15:48   CT Head Wo Contrast  Result Date: 02/21/2023 CLINICAL DATA:  Minor head trauma EXAM: CT HEAD WITHOUT CONTRAST CT CERVICAL SPINE WITHOUT CONTRAST TECHNIQUE: Multidetector CT imaging of the head and cervical spine was performed following the standard protocol without intravenous contrast. Multiplanar CT image reconstructions of the cervical spine were also generated.  RADIATION DOSE REDUCTION: This exam was performed according to the departmental dose-optimization program which includes automated exposure control, adjustment of the mA and/or kV according to patient size and/or use of iterative reconstruction technique. COMPARISON:  11/29/2022 FINDINGS: CT HEAD FINDINGS Brain: Trace left parafalcine subdural hematoma anteriorly, at the 2 mm in thickness. No brain mass effect. Atrophy of the brain, especially advanced at the temporal lobes. Chronic small vessel ischemia which is confluent in the deep white matter. No acute infarct or hydrocephalus. No masslike finding Vascular: No hyperdense vessel or unexpected calcification. Skull: Posterior scalp wound on the left without calvarial fracture or foreign body Sinuses/Orbits: No evidence of injury CT CERVICAL SPINE FINDINGS Alignment: Normal. Skull base and vertebrae: No acute fracture. No primary bone lesion or focal pathologic process. Soft tissues and spinal canal: No prevertebral fluid or swelling. No visible canal hematoma. Disc levels:  No evidence of impingement Upper chest: Clear apical lungs Critical Value/emergent results were called by telephone at the time of interpretation on 02/21/2023 at  12:41 pm to provider Alvino Blood , who verbally acknowledged these results. IMPRESSION: 1. Trace left parafalcine subdural hematoma, 2 mm in thickness. No brain contact. 2. Left posterior scalp wound without fracture. 3. Negative for cervical spine fracture or subluxation. Electronically Signed   By: Tiburcio Pea M.D.   On: 02/21/2023 12:43   CT Cervical Spine Wo Contrast  Result Date: 02/21/2023 CLINICAL DATA:  Minor head trauma EXAM: CT HEAD WITHOUT CONTRAST CT CERVICAL SPINE WITHOUT CONTRAST TECHNIQUE: Multidetector CT imaging of the head and cervical spine was performed following the standard protocol without intravenous contrast. Multiplanar CT image reconstructions of the cervical spine were also generated. RADIATION  DOSE REDUCTION: This exam was performed according to the departmental dose-optimization program which includes automated exposure control, adjustment of the mA and/or kV according to patient size and/or use of iterative reconstruction technique. COMPARISON:  11/29/2022 FINDINGS: CT HEAD FINDINGS Brain: Trace left parafalcine subdural hematoma anteriorly, at the 2 mm in thickness. No brain mass effect. Atrophy of the brain, especially advanced at the temporal lobes. Chronic small vessel ischemia which is confluent in the deep white matter. No acute infarct or hydrocephalus. No masslike finding Vascular: No hyperdense vessel or unexpected calcification. Skull: Posterior scalp wound on the left without calvarial fracture or foreign body Sinuses/Orbits: No evidence of injury CT CERVICAL SPINE FINDINGS Alignment: Normal. Skull base and vertebrae: No acute fracture. No primary bone lesion or focal pathologic process. Soft tissues and spinal canal: No prevertebral fluid or swelling. No visible canal hematoma. Disc levels:  No evidence of impingement Upper chest: Clear apical lungs Critical Value/emergent results were called by telephone at the time of interpretation on 02/21/2023 at 12:41 pm to provider Alvino Blood , who verbally acknowledged these results. IMPRESSION: 1. Trace left parafalcine subdural hematoma, 2 mm in thickness. No brain contact. 2. Left posterior scalp wound without fracture. 3. Negative for cervical spine fracture or subluxation. Electronically Signed   By: Tiburcio Pea M.D.   On: 02/21/2023 12:43    Pertinent labs & imaging results that were available during my care of the patient were reviewed by me and considered in my medical decision making (see MDM for details).  Medications Ordered in ED Medications  oxyCODONE (Oxy IR/ROXICODONE) immediate release tablet 5 mg (has no administration in time range)  Tdap (BOOSTRIX) injection 0.5 mL (0.5 mLs Intramuscular Given 02/21/23 1149)   acetaminophen (TYLENOL) tablet 1,000 mg (1,000 mg Oral Given 02/21/23 1149)  lidocaine (PF) (XYLOCAINE) 1 % injection 5 mL (5 mLs Intradermal Given 02/21/23 1207)                                                                                                                                     Procedures .Marland KitchenLaceration Repair  Date/Time: 02/21/2023 3:56 PM  Performed by: Lonell Grandchild, MD Authorized by: Lonell Grandchild, MD   Consent:    Consent obtained:  Verbal  Consent given by:  Patient   Risks, benefits, and alternatives were discussed: yes     Risks discussed:  Infection, need for additional repair, nerve damage, pain, vascular damage, poor wound healing, poor cosmetic result, retained foreign body and tendon damage   Alternatives discussed:  No treatment Universal protocol:    Patient identity confirmed:  Verbally with patient and arm band Laceration details:    Location:  Scalp   Scalp location:  Occipital   Length (cm):  2 Exploration:    Wound exploration: wound explored through full range of motion     Wound extent: areolar tissue not violated, fascia not violated, no foreign body, no signs of injury, no nerve damage, no tendon damage, no underlying fracture and no vascular damage     Contaminated: no   Treatment:    Area cleansed with:  Saline   Amount of cleaning:  Standard   Irrigation solution:  Sterile saline   Irrigation method:  Syringe   Visualized foreign bodies/material removed: no     Debridement:  None   Undermining:  None   Scar revision: no   Skin repair:    Repair method:  Staples Approximation:    Approximation:  Close Repair type:    Repair type:  Simple Post-procedure details:    Procedure completion:  Tolerated well, no immediate complications .Critical Care  Performed by: Lonell Grandchild, MD Authorized by: Lonell Grandchild, MD   Critical care provider statement:    Critical care time (minutes):  30   Critical care was  necessary to treat or prevent imminent or life-threatening deterioration of the following conditions:  CNS failure or compromise   Critical care was time spent personally by me on the following activities:  Development of treatment plan with patient or surrogate, discussions with consultants, evaluation of patient's response to treatment, examination of patient, ordering and review of laboratory studies, ordering and review of radiographic studies, ordering and performing treatments and interventions, pulse oximetry, re-evaluation of patient's condition and review of old charts   (including critical care time)  Medical Decision Making / ED Course   MDM:  89 year old female presenting to the emergency department after fall.  Patient well-appearing, physical exam notable for scalp laceration, painful range of motion of the left lower extremity.  Given fall, will obtain CT head and CT cervical spine.  Physical exam otherwise notable for the left hip pain so we will obtain x-ray of the left hip.  Unclear circumstances of fall but seems like he may have just lost balance and fell backwards.  Was recently evaluated cardiology for syncope and they felt symptoms were more consistent with dehydration.  Will reassess.    Clinical Course as of 02/21/23 1600  Fri Feb 21, 2023  1308 CT head with trace subdural. Will discuss with NSGY. XR hip/pelvis shows possible pelvic fx pending read.  [WS]  1431 Discussed with NP Cosentino who is covering neurosurgery with Dr. Gearldine Bienenstock. Recommend repeat head CT scan in 4-6 hours (~4-6pm).  [WS]  1432 XR on my interpretation shows pelvic fracture. Patients daughter worked with Dr. Romeo Apple and requests he consults.Will attempt to page him but not on call today.  [WS]  1554 Dr. Romeo Apple did not reply.  He is not on-call today.  Discussed with Dr. Aundria Rud who reviewed imaging.  He reports patient can be weightbearing as tolerated.  She is still having significant pain.  I did  repair her scalp laceration.  She will need repeat head  CT scan.  Signed out to oncoming provider Dr. Maple Hudson pending this.  Anticipate she will need to be admitted to the hospital for PT and pain control. [WS]    Clinical Course User Index [WS] Lonell Grandchild, MD     Additional history obtained: -Additional history obtained from family and ems    Lab Tests: -I ordered, reviewed, and interpreted labs.   The pertinent results include:   Labs Reviewed  BASIC METABOLIC PANEL  CBC WITH DIFFERENTIAL/PLATELET         Imaging Studies ordered: I ordered imaging studies including CT head, CT pelvis  On my interpretation imaging demonstrates subdural, pelvic fracture  I independently visualized and interpreted imaging. I agree with the radiologist interpretation   Medicines ordered and prescription drug management: Meds ordered this encounter  Medications   Tdap (BOOSTRIX) injection 0.5 mL   acetaminophen (TYLENOL) tablet 1,000 mg   lidocaine (PF) (XYLOCAINE) 1 % injection 5 mL   oxyCODONE (Oxy IR/ROXICODONE) immediate release tablet 5 mg    -I have reviewed the patients home medicines and have made adjustments as needed  Reevaluation: After the interventions noted above, I reevaluated the patient and found that their symptoms have improved  Co morbidities that complicate the patient evaluation  Past Medical History:  Diagnosis Date   Dementia (HCC)    Diverticulosis    Hyperlipidemia    Hypertension    Osteoporosis    Oth fracture of shaft of right humerus, init for opn fx 1990's   Renal insufficiency    Thyroid disease       Dispostion: Disposition decision including need for hospitalization was considered, and patient disposition pending at time of sign out.    Final Clinical Impression(s) / ED Diagnoses Final diagnoses:  Closed displaced fracture of pelvis, unspecified part of pelvis, initial encounter (HCC)  Subdural hematoma (HCC)     This chart  was dictated using voice recognition software.  Despite best efforts to proofread,  errors can occur which can change the documentation meaning.    Lonell Grandchild, MD 02/21/23 1600

## 2023-02-21 NOTE — ED Triage Notes (Signed)
Pt was witnessed falling from a standing position hitting the back of her head, small skin tear to left arm and pt is pain upon standing.

## 2023-02-22 ENCOUNTER — Inpatient Hospital Stay (HOSPITAL_COMMUNITY): Payer: Medicare Other

## 2023-02-22 DIAGNOSIS — S329XXA Fracture of unspecified parts of lumbosacral spine and pelvis, initial encounter for closed fracture: Secondary | ICD-10-CM

## 2023-02-22 DIAGNOSIS — S065XAA Traumatic subdural hemorrhage with loss of consciousness status unknown, initial encounter: Secondary | ICD-10-CM | POA: Diagnosis not present

## 2023-02-22 LAB — URINALYSIS, ROUTINE W REFLEX MICROSCOPIC
Bilirubin Urine: NEGATIVE
Glucose, UA: NEGATIVE mg/dL
Ketones, ur: NEGATIVE mg/dL
Nitrite: NEGATIVE
Protein, ur: 100 mg/dL — AB
Specific Gravity, Urine: 1.016 (ref 1.005–1.030)
WBC, UA: >50 WBC/hpf (ref 0–5)
pH: 5 (ref 5.0–8.0)

## 2023-02-22 LAB — CBC
HCT: 32.8 % — ABNORMAL LOW (ref 36.0–46.0)
Hemoglobin: 9.8 g/dL — ABNORMAL LOW (ref 12.0–15.0)
MCH: 26.6 pg (ref 26.0–34.0)
MCHC: 29.9 g/dL — ABNORMAL LOW (ref 30.0–36.0)
MCV: 88.9 fL (ref 80.0–100.0)
Platelets: 205 10*3/uL (ref 150–400)
RBC: 3.69 MIL/uL — ABNORMAL LOW (ref 3.87–5.11)
RDW: 15.9 % — ABNORMAL HIGH (ref 11.5–15.5)
WBC: 12 10*3/uL — ABNORMAL HIGH (ref 4.0–10.5)
nRBC: 0 % (ref 0.0–0.2)

## 2023-02-22 LAB — COMPREHENSIVE METABOLIC PANEL
ALT: 22 U/L (ref 0–44)
AST: 28 U/L (ref 15–41)
Albumin: 2.9 g/dL — ABNORMAL LOW (ref 3.5–5.0)
Alkaline Phosphatase: 44 U/L (ref 38–126)
Anion gap: 9 (ref 5–15)
BUN: 33 mg/dL — ABNORMAL HIGH (ref 8–23)
CO2: 24 mmol/L (ref 22–32)
Calcium: 8.3 mg/dL — ABNORMAL LOW (ref 8.9–10.3)
Chloride: 104 mmol/L (ref 98–111)
Creatinine, Ser: 1.46 mg/dL — ABNORMAL HIGH (ref 0.44–1.00)
GFR, Estimated: 33 mL/min — ABNORMAL LOW (ref >60)
Glucose, Bld: 172 mg/dL — ABNORMAL HIGH (ref 70–99)
Potassium: 4.9 mmol/L (ref 3.5–5.1)
Sodium: 137 mmol/L (ref 135–145)
Total Bilirubin: 0.7 mg/dL (ref 0.3–1.2)
Total Protein: 6.5 g/dL (ref 6.5–8.1)

## 2023-02-22 LAB — MAGNESIUM: Magnesium: 2.3 mg/dL (ref 1.7–2.4)

## 2023-02-22 LAB — MRSA NEXT GEN BY PCR, NASAL: MRSA by PCR Next Gen: NOT DETECTED

## 2023-02-22 LAB — PHOSPHORUS: Phosphorus: 5.1 mg/dL — ABNORMAL HIGH (ref 2.5–4.6)

## 2023-02-22 MED ORDER — LIDOCAINE 5 % EX PTCH
1.0000 | MEDICATED_PATCH | CUTANEOUS | Status: DC
  Administered 2023-02-22 – 2023-02-25 (×4): 1 via TRANSDERMAL
  Filled 2023-02-22 (×4): qty 1

## 2023-02-22 MED ORDER — OXYCODONE HCL 5 MG PO TABS
5.0000 mg | ORAL_TABLET | ORAL | Status: DC | PRN
  Administered 2023-02-23 – 2023-02-24 (×2): 5 mg via ORAL
  Filled 2023-02-22 (×2): qty 1

## 2023-02-22 MED ORDER — ACETAMINOPHEN 500 MG PO TABS
1000.0000 mg | ORAL_TABLET | Freq: Three times a day (TID) | ORAL | Status: DC
  Administered 2023-02-22 – 2023-02-25 (×11): 1000 mg via ORAL
  Filled 2023-02-22 (×13): qty 2

## 2023-02-22 NOTE — Progress Notes (Signed)
PT Cancellation Note  Patient Details Name: Dawn Conner MRN: [id number] DOB: [birth year]   Cancelled Treatment:    Reason Eval/Treat Not Completed: Patient's level of consciousness;Pain limiting ability to participate;Patient declined, no reason specified  Entering pt's room @ 1051, assessed AxO, oriented only to name. When attempting to assess PLOF pt dozing in/out and closing eyes. History of poor historian per chart review. Pt demonstrating hesitancy for mobility evaluation this session, when encouraged, pt reporting not desiring to try at this time.   PT will evaluate pt as schedule permits based on pt's availabiltiy and level of participation in functional assessment.   Nelida Meuse 02/22/2023, 11:11 AM

## 2023-02-22 NOTE — Progress Notes (Addendum)
PROGRESS NOTE    Dawn Conner  WUJ:[id number] DOB: December 16, 1927 DOA: 02/21/2023 PCP: Donita Brooks, MD    Brief Narrative:  Dawn Conner is a 89 y.o. female from [place] with medical history significant of tension, hyperlipidemia, hypothyroidism, dementia, osteoporosis and CKD 3B who presents to the emergency department due to a fall from standing position today.   Had syncopal episodes in past.   Assessment and Plan: Closed displaced fracture of the pelvis CT pelvis without contrast showed pelvic bone fractures  Orthopedic was consulted and no indication for any surgical intervention, but to continue to bear weight as tolerated. -schedule tylenol, PRN oxy - fall precaution - PT/OT eval and treat   Subdural hematoma CT head without contrast showed trace left parafalcine subdural hematoma, 2 mm in thickness Neurosurgery NP was consulted and recommended a repeat CT of head in 4 to 6 hours.  This was done and there was no change in patient's subdural hematoma.   Fall at home - fall precaution -PT/OT eval and treat -outpatient syncope work up in progress by cardiology with event monitor -check orthostatics   Scalp wound Patient sustained laceration to posterior scalp due to fall, this was repaired wit staples-- these will need to be removed Continue wound care   Leukocytosis (possibly reactive) WBC 14.4, no obvious sign of any acute infectious process at this time -check urine  RLE swelling > LLE -duplex pending   Pancreatic insufficiency Continue Creon   AAA CT pelvis without contrast showed a 4 cm.  Fusiform distal abdominal aortic aneurysm Follow-up CT or MRI in 12 months and referral to all continued care with vascular specialist was recommended by radiologist   CKD 3B BUN/creatinine 26/1.32 (baseline creatinine at 1.0-1.2). - encourage PO intake    Acquired hypothyroidism Continue Synthroid   Dementia: Patient is not oriented to time and place. She is only  oriented to self. Continue donepezil 10 mg nightly, follow-up with PCP.    Family aware that we may need palliative care involvement if we can not rehab/control pain   DVT prophylaxis: SCDs Start: 02/21/23 2016    Code Status: Limited: Do not attempt resuscitation (DNR) -DNR-LIMITED -Do Not Intubate/DNI  Family Communication:   Disposition Plan:  Level of care: Med-Surg Status is: Inpatient Remains inpatient appropriate     Consultants:  NS (phone) Ortho (phone)   Subjective: C/o hip pain  Objective: Vitals:   02/21/23 1116 02/21/23 1500 02/21/23 1900 02/21/23 2057  BP:  (!) 145/89 (!) 149/84 (!) 104/55  Pulse:  85 86 62  Resp:  16 16 18   Temp:  97.9 F (36.6 C) 97.8 F (36.6 C) 98.8 F (37.1 C)  TempSrc:  Oral  Oral  SpO2:  97% 96%   Weight: 44.8 kg   47.6 kg  Height: 4\' 11"  (1.499 m)      No intake or output data in the 24 hours ending 02/22/23 0741 Filed Weights   02/21/23 1116 02/21/23 2057  Weight: 44.8 kg 47.6 kg    Examination:   General: Appearance:    Well developed, well nourished female in no acute distress     Lungs:     respirations unlabored  Heart:    Normal heart rate.     MS:   All extremities are intact.    Neurologic:   Awake, alert       Data Reviewed: I have personally reviewed following labs and imaging studies  CBC: Recent Labs  Lab 02/21/23 1553 02/22/23 0352  WBC 14.4* 12.0*  NEUTROABS 11.6*  --   HGB 11.4* 9.8*  HCT 37.9 32.8*  MCV 89.0 88.9  PLT 233 205   Basic Metabolic Panel: Recent Labs  Lab 02/21/23 1553 02/22/23 0352  NA 136 137  K 3.9 4.9  CL 104 104  CO2 25 24  GLUCOSE 151* 172*  BUN 26* 33*  CREATININE 1.32* 1.46*  CALCIUM 8.5* 8.3*  MG  --  2.3  PHOS  --  5.1*   GFR: Estimated Creatinine Clearance: 16.4 mL/min (A) (by C-G formula based on SCr of 1.46 mg/dL (H)). Liver Function Tests: Recent Labs  Lab 02/22/23 0352  AST 28  ALT 22  ALKPHOS 44  BILITOT 0.7  PROT 6.5  ALBUMIN 2.9*    No results for input(s): "LIPASE", "AMYLASE" in the last 168 hours. No results for input(s): "AMMONIA" in the last 168 hours. Coagulation Profile: No results for input(s): "INR", "PROTIME" in the last 168 hours. Cardiac Enzymes: No results for input(s): "CKTOTAL", "CKMB", "CKMBINDEX", "TROPONINI" in the last 168 hours. BNP (last 3 results) No results for input(s): "PROBNP" in the last 8760 hours. HbA1C: No results for input(s): "HGBA1C" in the last 72 hours. CBG: No results for input(s): "GLUCAP" in the last 168 hours. Lipid Profile: No results for input(s): "CHOL", "HDL", "LDLCALC", "TRIG", "CHOLHDL", "LDLDIRECT" in the last 72 hours. Thyroid Function Tests: No results for input(s): "TSH", "T4TOTAL", "FREET4", "T3FREE", "THYROIDAB" in the last 72 hours. Anemia Panel: No results for input(s): "VITAMINB12", "FOLATE", "FERRITIN", "TIBC", "IRON", "RETICCTPCT" in the last 72 hours. Sepsis Labs: No results for input(s): "PROCALCITON", "LATICACIDVEN" in the last 168 hours.  No results found for this or any previous visit (from the past 240 hour(s)).       Radiology Studies: CT Head Wo Contrast  Result Date: 02/21/2023 CLINICAL DATA:  4-6 hour follow-up for subdural hematoma EXAM: CT HEAD WITHOUT CONTRAST TECHNIQUE: Contiguous axial images were obtained from the base of the skull through the vertex without intravenous contrast. RADIATION DOSE REDUCTION: This exam was performed according to the departmental dose-optimization program which includes automated exposure control, adjustment of the mA and/or kV according to patient size and/or use of iterative reconstruction technique. COMPARISON:  02/21/2023 12:33 p.m. FINDINGS: Brain: Redemonstrated trace left parafalcine subdural hematoma along the anterior falx, which measures up to 2 mm, unchanged from the prior exam. No significant mass effect. No new extra-axial collection is seen. No evidence of acute infarction, parenchymal hemorrhage,  mass, mass effect, or midline shift. No hydrocephalus. Periventricular white matter changes, likely the sequela of chronic small vessel ischemic disease. Vascular: No hyperdense vessel. Atherosclerotic calcifications in the intracranial carotid and vertebral arteries. Skull: Negative for fracture or focal lesion. Sinuses/Orbits: No acute finding. Other: The mastoid air cells are well aerated. IMPRESSION: Unchanged trace left parafalcine subdural hematoma along the anterior falx. No significant mass effect. No new extra-axial collection. Electronically Signed   By: Wiliam Ke M.D.   On: 02/21/2023 18:57   CT PELVIS WO CONTRAST  Result Date: 02/21/2023 CLINICAL DATA:  Pelvic fracture. EXAM: CT PELVIS WITHOUT CONTRAST TECHNIQUE: Multidetector CT imaging of the pelvis was performed following the standard protocol without intravenous contrast. RADIATION DOSE REDUCTION: This exam was performed according to the departmental dose-optimization program which includes automated exposure control, adjustment of the mA and/or kV according to patient size and/or use of iterative reconstruction technique. COMPARISON:  Left hip radiograph dated 02/21/2023. FINDINGS: Evaluation of this exam is limited in the absence of intravenous contrast. Urinary  Tract: The urinary bladder is partially distended and grossly unremarkable. Bowel: There is severe sigmoid diverticulosis with muscular hypertrophy. Additional scattered colonic diverticula. No bowel dilatation in the pelvis. Vascular/Lymphatic: Advanced aortoiliac atherosclerotic disease. There is a 4 cm fusiform distal abdominal aortic aneurysm. No pelvic adenopathy. Reproductive:  Hysterectomy. Other: Moderate amount of hematoma within the lower pelvis anterior and to the left of the bladder. Musculoskeletal: There is a comminuted and displaced fracture of the left superior pubic ramus close to the symphysis previous as well as mildly displaced fracture of the left inferior pubic  ramus. Nondisplaced fracture of the right superior pubic ramus as well as nondisplaced fractures of the left sacral al a. IMPRESSION: 1. Pelvic bone fractures as above with moderate hematoma along the pelvic floor anterior and to the left of the urinary bladder. 2. Severe sigmoid diverticulosis. 3. A 4 cm fusiform distal abdominal aortic aneurysm. Recommend follow-up CT or MR as appropriate in 12 months and referral to or continued care with vascular specialist. (Ref.: J Vasc Surg. 2018; 67:2-77 and J Am Coll Radiol 2013;10(10):789-794.) 4.  Aortic Atherosclerosis (ICD10-I70.0). Electronically Signed   By: Elgie Collard M.D.   On: 02/21/2023 17:49   DG Hip Unilat With Pelvis 2-3 Views Left  Result Date: 02/21/2023 CLINICAL DATA:  Hip pain.  Fall EXAM: DG HIP (WITH OR WITHOUT PELVIS) 2-3V LEFT COMPARISON:  None Available. FINDINGS: Severe osteopenia. Sclerosis and joint space loss along the sacroiliac joints. Scattered vascular calcifications. There is a comminuted fracture along the pubic bone towards the margin of the symphysis but extending along the superior and inferior pubic ramus. IMPRESSION: Comminuted fracture along the left pubic bone towards the symphysis with some involvement of the superior and inferior pubic ramus. Severe osteopenia.  Degenerative change Electronically Signed   By: Karen Kays M.D.   On: 02/21/2023 15:48   CT Head Wo Contrast  Result Date: 02/21/2023 CLINICAL DATA:  Minor head trauma EXAM: CT HEAD WITHOUT CONTRAST CT CERVICAL SPINE WITHOUT CONTRAST TECHNIQUE: Multidetector CT imaging of the head and cervical spine was performed following the standard protocol without intravenous contrast. Multiplanar CT image reconstructions of the cervical spine were also generated. RADIATION DOSE REDUCTION: This exam was performed according to the departmental dose-optimization program which includes automated exposure control, adjustment of the mA and/or kV according to patient size  and/or use of iterative reconstruction technique. COMPARISON:  11/29/2022 FINDINGS: CT HEAD FINDINGS Brain: Trace left parafalcine subdural hematoma anteriorly, at the 2 mm in thickness. No brain mass effect. Atrophy of the brain, especially advanced at the temporal lobes. Chronic small vessel ischemia which is confluent in the deep white matter. No acute infarct or hydrocephalus. No masslike finding Vascular: No hyperdense vessel or unexpected calcification. Skull: Posterior scalp wound on the left without calvarial fracture or foreign body Sinuses/Orbits: No evidence of injury CT CERVICAL SPINE FINDINGS Alignment: Normal. Skull base and vertebrae: No acute fracture. No primary bone lesion or focal pathologic process. Soft tissues and spinal canal: No prevertebral fluid or swelling. No visible canal hematoma. Disc levels:  No evidence of impingement Upper chest: Clear apical lungs Critical Value/emergent results were called by telephone at the time of interpretation on 02/21/2023 at 12:41 pm to provider Alvino Blood , who verbally acknowledged these results. IMPRESSION: 1. Trace left parafalcine subdural hematoma, 2 mm in thickness. No brain contact. 2. Left posterior scalp wound without fracture. 3. Negative for cervical spine fracture or subluxation. Electronically Signed   By: Audry Riles.D.  On: 02/21/2023 12:43   CT Cervical Spine Wo Contrast  Result Date: 02/21/2023 CLINICAL DATA:  Minor head trauma EXAM: CT HEAD WITHOUT CONTRAST CT CERVICAL SPINE WITHOUT CONTRAST TECHNIQUE: Multidetector CT imaging of the head and cervical spine was performed following the standard protocol without intravenous contrast. Multiplanar CT image reconstructions of the cervical spine were also generated. RADIATION DOSE REDUCTION: This exam was performed according to the departmental dose-optimization program which includes automated exposure control, adjustment of the mA and/or kV according to patient size and/or use  of iterative reconstruction technique. COMPARISON:  11/29/2022 FINDINGS: CT HEAD FINDINGS Brain: Trace left parafalcine subdural hematoma anteriorly, at the 2 mm in thickness. No brain mass effect. Atrophy of the brain, especially advanced at the temporal lobes. Chronic small vessel ischemia which is confluent in the deep white matter. No acute infarct or hydrocephalus. No masslike finding Vascular: No hyperdense vessel or unexpected calcification. Skull: Posterior scalp wound on the left without calvarial fracture or foreign body Sinuses/Orbits: No evidence of injury CT CERVICAL SPINE FINDINGS Alignment: Normal. Skull base and vertebrae: No acute fracture. No primary bone lesion or focal pathologic process. Soft tissues and spinal canal: No prevertebral fluid or swelling. No visible canal hematoma. Disc levels:  No evidence of impingement Upper chest: Clear apical lungs Critical Value/emergent results were called by telephone at the time of interpretation on 02/21/2023 at 12:41 pm to provider Alvino Blood , who verbally acknowledged these results. IMPRESSION: 1. Trace left parafalcine subdural hematoma, 2 mm in thickness. No brain contact. 2. Left posterior scalp wound without fracture. 3. Negative for cervical spine fracture or subluxation. Electronically Signed   By: Tiburcio Pea M.D.   On: 02/21/2023 12:43        Scheduled Meds:  acetaminophen  1,000 mg Oral TID PC & HS   calcium carbonate  1 tablet Oral Q breakfast   donepezil  10 mg Oral QHS   levothyroxine  75 mcg Oral Q0600   lipase/protease/amylase  36,000 Units Oral TID WC   Continuous Infusions:   LOS: 1 day    Time spent: 45 minutes spent on chart review, discussion with nursing staff, consultants, updating family and interview/physical exam; more than 50% of that time was spent in counseling and/or coordination of care.    Joseph Art, DO [facility] Available via Epic secure chat 7am-7pm After these hours,  please refer to coverage provider listed on [email or web address] 02/22/2023, 7:41 AM

## 2023-02-22 NOTE — Plan of Care (Signed)

## 2023-02-23 DIAGNOSIS — I10 Essential (primary) hypertension: Secondary | ICD-10-CM

## 2023-02-23 DIAGNOSIS — N3000 Acute cystitis without hematuria: Secondary | ICD-10-CM | POA: Diagnosis not present

## 2023-02-23 DIAGNOSIS — S32501A Unspecified fracture of right pubis, initial encounter for closed fracture: Secondary | ICD-10-CM | POA: Diagnosis not present

## 2023-02-23 DIAGNOSIS — D72829 Elevated white blood cell count, unspecified: Secondary | ICD-10-CM

## 2023-02-23 DIAGNOSIS — N1832 Chronic kidney disease, stage 3b: Secondary | ICD-10-CM

## 2023-02-23 DIAGNOSIS — F0390 Unspecified dementia without behavioral disturbance: Secondary | ICD-10-CM

## 2023-02-23 DIAGNOSIS — S32501S Unspecified fracture of right pubis, sequela: Secondary | ICD-10-CM

## 2023-02-23 DIAGNOSIS — N390 Urinary tract infection, site not specified: Secondary | ICD-10-CM | POA: Diagnosis present

## 2023-02-23 LAB — CBC
HCT: 29.7 % — ABNORMAL LOW (ref 36.0–46.0)
Hemoglobin: 9.1 g/dL — ABNORMAL LOW (ref 12.0–15.0)
MCH: 27.1 pg (ref 26.0–34.0)
MCHC: 30.6 g/dL (ref 30.0–36.0)
MCV: 88.4 fL (ref 80.0–100.0)
Platelets: 174 10*3/uL (ref 150–400)
RBC: 3.36 MIL/uL — ABNORMAL LOW (ref 3.87–5.11)
RDW: 15.9 % — ABNORMAL HIGH (ref 11.5–15.5)
WBC: 12.9 10*3/uL — ABNORMAL HIGH (ref 4.0–10.5)
nRBC: 0 % (ref 0.0–0.2)

## 2023-02-23 LAB — BASIC METABOLIC PANEL
Anion gap: 9 (ref 5–15)
BUN: 45 mg/dL — ABNORMAL HIGH (ref 8–23)
CO2: 24 mmol/L (ref 22–32)
Calcium: 8.3 mg/dL — ABNORMAL LOW (ref 8.9–10.3)
Chloride: 103 mmol/L (ref 98–111)
Creatinine, Ser: 1.71 mg/dL — ABNORMAL HIGH (ref 0.44–1.00)
GFR, Estimated: 28 mL/min — ABNORMAL LOW (ref >60)
Glucose, Bld: 138 mg/dL — ABNORMAL HIGH (ref 70–99)
Potassium: 4.3 mmol/L (ref 3.5–5.1)
Sodium: 136 mmol/L (ref 135–145)

## 2023-02-23 MED ORDER — SODIUM CHLORIDE 0.9 % IV SOLN
1.0000 g | INTRAVENOUS | Status: DC
  Administered 2023-02-23 – 2023-02-24 (×2): 1 g via INTRAVENOUS
  Filled 2023-02-23 (×2): qty 10

## 2023-02-23 NOTE — Progress Notes (Signed)
PT Cancellation Note  Patient Details Name: Dawn Conner MRN: [id number] DOB: 1927/04/26   Cancelled Treatment:    Reason Eval/Treat Not Completed: Patient's level of consciousness;Pain limiting ability to participate;Patient declined, no reason specified  Entering pt's room @ 0815, assessed AxO, today, pt unable to give personal name. Pt repeating questions back to therapist. Attempted to encourage pt for mobility, continues with hesitancy and unable to functionally follow commands at this time.   PT will continue attempts to evaluate pt based upon willingness and cognitive status.   Nelida Meuse 02/23/2023, 8:20 AM

## 2023-02-23 NOTE — Plan of Care (Signed)

## 2023-02-23 NOTE — Progress Notes (Signed)
PROGRESS NOTE  Dawn Conner  ZOX:[id number] DOB: 1927-06-27 DOA: 02/21/2023 PCP: Donita Brooks, MD    Brief Narrative:  Dawn Conner is a 89 y.o. female from [place] with medical history significant of tension, hyperlipidemia, hypothyroidism, dementia, osteoporosis and CKD 3B who presents to the emergency department due to a fall from standing position today.   Had syncopal episodes in past.   Assessment and Plan: 1)Closed displaced fracture of the pelvis CT pelvis without contrast showed pelvic bone fractures  Orthopedic was consulted and no indication for any surgical intervention, but to continue to bear weight as tolerated. -schedule tylenol, PRN oxy - fall precautions - PT/OT eval and treat pending (-Patient was Not cooperating with PT evaluation due to confusion)   2)Subdural hematoma CT head without contrast showed trace left parafalcine subdural hematoma, 2 mm in thickness Neurosurgery NP was consulted and recommended a repeat CT of head in 4 to 6 hours.  This was done and there was no change in patient's subdural hematoma.   3)Fall at home - fall precaution -PT/OT eval and treat -outpatient syncope work up in progress by cardiology with event monitor   4)Scalp wound Patient sustained laceration to posterior scalp due to fall, this was repaired wit staples-- these will need to be removed Continue wound care   5)Presumed UTI--- UA from 02/22/2023 suggestive of UTI  WBC 14.4 >>12.0 >>12.9 -IV Rocephin pending urine culture  6)RLE swelling > LLE -duplex without DVT   7)Pancreatic insufficiency Continue Creon   8)AAA CT pelvis without contrast showed a 4 cm.  Fusiform distal abdominal aortic aneurysm Follow-up CT or MRI in 12 months and referral to all continued care with vascular specialist was recommended by radiologist   9)Aki on CKD 3B Creatinine up to 1.7 (was  1.1 on 10/11/22) Creatinine was 1.3 on admission - renally adjust medications, avoid nephrotoxic  agents / dehydration  / hypotension - encourage PO intake  10)Acquired hypothyroidism Continue Synthroid   11)Advanced dementia: Patient is not oriented to time and place. She is only oriented to self. Continue donepezil 10 mg nightly, follow-up with PCP.   12)Social/Ethics--official palliative care consult requested to further delineate goals of care  -patient is a DNR  DVT prophylaxis: SCDs Start: 02/21/23 2016    Code Status: Limited: Do not attempt resuscitation (DNR) -DNR-LIMITED -Do Not Intubate/DNI  Family Communication: none at bedside   Disposition Plan: Anticipate discharge to SNF if PT is able to do a PT eval on her Level of care: Med-Surg Status is: Inpatient Remains inpatient appropriate     Consultants:  NS (phone) Ortho (phone)   Subjective: - Pleasantly confused -Oral intake is fair No fevers or emesis -Patient was Not cooperating with PT evaluation due to confusion  Objective: Vitals:   02/21/23 1500 02/21/23 1900 02/21/23 2057 02/22/23 1316  BP: (!) 145/89 (!) 149/84 (!) 104/55 (!) 121/51  Pulse: 85 86 62 68  Resp: 16 16 18    Temp: 97.9 F (36.6 C) 97.8 F (36.6 C) 98.8 F (37.1 C) 98.5 F (36.9 C)  TempSrc: Oral  Oral   SpO2: 97% 96%  95%  Weight:   47.6 kg   Height:        Intake/Output Summary (Last 24 hours) at 02/23/2023 1434 Last data filed at 02/23/2023 0900 Gross per 24 hour  Intake 480 ml  Output 200 ml  Net 280 ml   Filed Weights   02/21/23 1116 02/21/23 2057  Weight: 44.8 kg 47.6 kg  Physical Exam Gen:- Awake Alert, in no acute distress , pleasantly confused, oriented x 1 HEENT:- -hemostatic posterior scalp wound/laceration repaired Neck-Supple Neck,No JVD,.  Lungs-  CTAB , fair air movement bilaterally  CV- S1, S2 normal, RRR Abd-  +ve B.Sounds, Abd Soft, No tenderness,    Extremity/Skin:- No  edema,   good pedal pulses , left forearm abrasion Psych-affect is appropriate, oriented x1, pleasantly confused with obvious  cognitive and memory deficits Neuro-generalized weakness, no new focal deficits, no tremors MSK-limited range of motion due to pelvic area discomfort with positional change and movement, especially left hip area   Data Reviewed: I have personally reviewed following labs and imaging studies  CBC: Recent Labs  Lab 02/21/23 1553 02/22/23 0352 02/23/23 0459  WBC 14.4* 12.0* 12.9*  NEUTROABS 11.6*  --   --   HGB 11.4* 9.8* 9.1*  HCT 37.9 32.8* 29.7*  MCV 89.0 88.9 88.4  PLT 233 205 174   Basic Metabolic Panel: Recent Labs  Lab 02/21/23 1553 02/22/23 0352 02/23/23 0459  NA 136 137 136  K 3.9 4.9 4.3  CL 104 104 103  CO2 25 24 24   GLUCOSE 151* 172* 138*  BUN 26* 33* 45*  CREATININE 1.32* 1.46* 1.71*  CALCIUM 8.5* 8.3* 8.3*  MG  --  2.3  --   PHOS  --  5.1*  --    GFR: Estimated Creatinine Clearance: 14 mL/min (A) (by C-G formula based on SCr of 1.71 mg/dL (H)). Liver Function Tests: Recent Labs  Lab 02/22/23 0352  AST 28  ALT 22  ALKPHOS 44  BILITOT 0.7  PROT 6.5  ALBUMIN 2.9*   Recent Results (from the past 240 hour(s))  MRSA Next Gen by PCR, Nasal     Status: None   Collection Time: 02/22/23 11:37 AM   Specimen: Nasal Mucosa; Nasal Swab  Result Value Ref Range Status   MRSA by PCR Next Gen NOT DETECTED NOT DETECTED Final    Comment: (NOTE) The GeneXpert MRSA Assay (FDA approved for NASAL specimens only), is one component of a comprehensive MRSA colonization surveillance program. It is not intended to diagnose MRSA infection nor to guide or monitor treatment for MRSA infections. Test performance is not FDA approved in patients less than 68 years old. Performed at [facility], [address], [place], [place] [address]     Radiology Studies: US Venous Img Lower Unilateral Right (DVT)  Result Date: 02/22/2023 CLINICAL DATA:  Right lower extremity pain for the past day. Evaluate for DVT. EXAM: RIGHT LOWER EXTREMITY VENOUS DOPPLER ULTRASOUND TECHNIQUE:  Gray-scale sonography with graded compression, as well as color Doppler and duplex ultrasound were performed to evaluate the lower extremity deep venous systems from the level of the common femoral vein and including the common femoral, femoral, profunda femoral, popliteal and calf veins including the posterior tibial, peroneal and gastrocnemius veins when visible. The superficial great saphenous vein was also interrogated. Spectral Doppler was utilized to evaluate flow at rest and with distal augmentation maneuvers in the common femoral, femoral and popliteal veins. COMPARISON:  None Available. FINDINGS: Contralateral Common Femoral Vein: Respiratory phasicity is normal and symmetric with the symptomatic side. No evidence of thrombus. Normal compressibility. Common Femoral Vein: No evidence of thrombus. Normal compressibility, respiratory phasicity and response to augmentation. Saphenofemoral Junction: No evidence of thrombus. Normal compressibility and flow on color Doppler imaging. Profunda Femoral Vein: No evidence of thrombus. Normal compressibility and flow on color Doppler imaging. Femoral Vein: No evidence of thrombus. Normal  compressibility, respiratory phasicity and response to augmentation. Popliteal Vein: No evidence of thrombus. Normal compressibility, respiratory phasicity and response to augmentation. Calf Veins: No evidence of thrombus. Normal compressibility and flow on color Doppler imaging. Superficial Great Saphenous Vein: No evidence of thrombus. Normal compressibility. Other Findings:  None. IMPRESSION: No evidence of DVT within the right lower extremity. Electronically Signed   By: Simonne Come M.D.   On: 02/22/2023 14:34   CT Head Wo Contrast  Result Date: 02/21/2023 CLINICAL DATA:  4-6 hour follow-up for subdural hematoma EXAM: CT HEAD WITHOUT CONTRAST TECHNIQUE: Contiguous axial images were obtained from the base of the skull through the vertex without intravenous contrast. RADIATION  DOSE REDUCTION: This exam was performed according to the departmental dose-optimization program which includes automated exposure control, adjustment of the mA and/or kV according to patient size and/or use of iterative reconstruction technique. COMPARISON:  02/21/2023 12:33 p.m. FINDINGS: Brain: Redemonstrated trace left parafalcine subdural hematoma along the anterior falx, which measures up to 2 mm, unchanged from the prior exam. No significant mass effect. No new extra-axial collection is seen. No evidence of acute infarction, parenchymal hemorrhage, mass, mass effect, or midline shift. No hydrocephalus. Periventricular white matter changes, likely the sequela of chronic small vessel ischemic disease. Vascular: No hyperdense vessel. Atherosclerotic calcifications in the intracranial carotid and vertebral arteries. Skull: Negative for fracture or focal lesion. Sinuses/Orbits: No acute finding. Other: The mastoid air cells are well aerated. IMPRESSION: Unchanged trace left parafalcine subdural hematoma along the anterior falx. No significant mass effect. No new extra-axial collection. Electronically Signed   By: Wiliam Ke M.D.   On: 02/21/2023 18:57   CT PELVIS WO CONTRAST  Result Date: 02/21/2023 CLINICAL DATA:  Pelvic fracture. EXAM: CT PELVIS WITHOUT CONTRAST TECHNIQUE: Multidetector CT imaging of the pelvis was performed following the standard protocol without intravenous contrast. RADIATION DOSE REDUCTION: This exam was performed according to the departmental dose-optimization program which includes automated exposure control, adjustment of the mA and/or kV according to patient size and/or use of iterative reconstruction technique. COMPARISON:  Left hip radiograph dated 02/21/2023. FINDINGS: Evaluation of this exam is limited in the absence of intravenous contrast. Urinary Tract: The urinary bladder is partially distended and grossly unremarkable. Bowel: There is severe sigmoid diverticulosis with  muscular hypertrophy. Additional scattered colonic diverticula. No bowel dilatation in the pelvis. Vascular/Lymphatic: Advanced aortoiliac atherosclerotic disease. There is a 4 cm fusiform distal abdominal aortic aneurysm. No pelvic adenopathy. Reproductive:  Hysterectomy. Other: Moderate amount of hematoma within the lower pelvis anterior and to the left of the bladder. Musculoskeletal: There is a comminuted and displaced fracture of the left superior pubic ramus close to the symphysis previous as well as mildly displaced fracture of the left inferior pubic ramus. Nondisplaced fracture of the right superior pubic ramus as well as nondisplaced fractures of the left sacral al a. IMPRESSION: 1. Pelvic bone fractures as above with moderate hematoma along the pelvic floor anterior and to the left of the urinary bladder. 2. Severe sigmoid diverticulosis. 3. A 4 cm fusiform distal abdominal aortic aneurysm. Recommend follow-up CT or MR as appropriate in 12 months and referral to or continued care with vascular specialist. (Ref.: J Vasc Surg. 2018; 67:2-77 and J Am Coll Radiol 2013;10(10):789-794.) 4.  Aortic Atherosclerosis (ICD10-I70.0). Electronically Signed   By: Elgie Collard M.D.   On: 02/21/2023 17:49    Scheduled Meds:  acetaminophen  1,000 mg Oral TID PC & HS   calcium carbonate  1 tablet Oral Q  breakfast   donepezil  10 mg Oral QHS   levothyroxine  75 mcg Oral Q0600   lidocaine  1 patch Transdermal Q24H   lipase/protease/amylase  36,000 Units Oral TID WC   Continuous Infusions:  cefTRIAXone (ROCEPHIN)  IV      LOS: 2 days   Shon Hale, MD [facility] Available via Epic secure chat 7am-7pm After these hours, please refer to coverage provider listed on [email or web address] 02/23/2023, 2:34 PM

## 2023-02-24 ENCOUNTER — Encounter: Payer: Self-pay | Admitting: Internal Medicine

## 2023-02-24 ENCOUNTER — Encounter (HOSPITAL_COMMUNITY): Payer: Self-pay | Admitting: Internal Medicine

## 2023-02-24 ENCOUNTER — Other Ambulatory Visit: Payer: Self-pay | Admitting: Gastroenterology

## 2023-02-24 DIAGNOSIS — Z7189 Other specified counseling: Secondary | ICD-10-CM

## 2023-02-24 DIAGNOSIS — I10 Essential (primary) hypertension: Secondary | ICD-10-CM | POA: Diagnosis not present

## 2023-02-24 DIAGNOSIS — Z515 Encounter for palliative care: Secondary | ICD-10-CM

## 2023-02-24 DIAGNOSIS — S32501S Unspecified fracture of right pubis, sequela: Secondary | ICD-10-CM | POA: Diagnosis not present

## 2023-02-24 DIAGNOSIS — K8681 Exocrine pancreatic insufficiency: Secondary | ICD-10-CM

## 2023-02-24 DIAGNOSIS — D72829 Elevated white blood cell count, unspecified: Secondary | ICD-10-CM | POA: Diagnosis not present

## 2023-02-24 DIAGNOSIS — E440 Moderate protein-calorie malnutrition: Secondary | ICD-10-CM | POA: Insufficient documentation

## 2023-02-24 DIAGNOSIS — N3000 Acute cystitis without hematuria: Secondary | ICD-10-CM | POA: Diagnosis not present

## 2023-02-24 DIAGNOSIS — S329XXA Fracture of unspecified parts of lumbosacral spine and pelvis, initial encounter for closed fracture: Secondary | ICD-10-CM | POA: Diagnosis not present

## 2023-02-24 MED ORDER — PANCRELIPASE (LIP-PROT-AMYL) 36000-114000 UNITS PO CPEP: 36000.0000 [IU] | ORAL_CAPSULE | Freq: Three times a day (TID) | ORAL | 5 refills | Status: DC

## 2023-02-24 MED ORDER — BOOST / RESOURCE BREEZE PO LIQD CUSTOM
1.0000 | Freq: Three times a day (TID) | ORAL | Status: DC
  Administered 2023-02-24 (×2): 1 via ORAL

## 2023-02-24 MED ORDER — SODIUM CHLORIDE 0.9 % IV SOLN: INTRAVENOUS | Status: AC

## 2023-02-24 NOTE — Plan of Care (Signed)
  Problem: Acute Rehab OT Goals (only OT should resolve) Goal: Pt. Will Perform Grooming Flowsheets (Taken 02/24/2023 0956) Pt Will Perform Grooming:  with contact guard assist  standing  with min assist Goal: Pt. Will Perform Lower Body Bathing Flowsheets (Taken 02/24/2023 0956) Pt Will Perform Lower Body Bathing:  with contact guard assist  sitting/lateral leans Goal: Pt. Will Perform Lower Body Dressing Flowsheets (Taken 02/24/2023 0956) Pt Will Perform Lower Body Dressing:  with contact guard assist  sitting/lateral leans Goal: Pt. Will Transfer To Toilet Flowsheets (Taken 02/24/2023 [phone]) Pt Will Transfer to Toilet:  with contact guard assist  stand pivot transfer Goal: Pt/Caregiver Will Perform Home Exercise Program Flowsheets (Taken 02/24/2023 0956) Pt/caregiver will Perform Home Exercise Program:  Increased strength  Both right and left upper extremity  With Supervision  Pankaj Haack OT, MOT

## 2023-02-24 NOTE — TOC CM/SW Note (Addendum)
Patient suffers from ___closed pelvic fracture__ which impairs their ability to perform daily activities like ambulating in  the home. A walker will not resolve issue with performing activities of daily living. A wheelchair  will allow patient to safely perform daily activities. Patient can safely propel the wheelchair in the  home or has a caregiver who can provide assistance.  Patient suffers from pelvic fractures which impairs their ability to perform daily activities like bathing, dressing, grooming, and toileting in the home.  A cane, crutch, or walker will not resolve  issue with performing activities of daily living. A wheelchair will allow patient to safely perform daily activities.Length of need Lifetime. Patient self-propels the wheelchair while engaging in frequent activities such as meals and toileting which cannot be performed in a standard or lightweight wheelchair due to the weight of the chair. Accessories: elevating leg rests (ELRs), wheel locks, extensions and anti-tippers.

## 2023-02-24 NOTE — Consult Note (Signed)
Consultation Note Date: 02/24/2023   Patient Name: Dawn Conner  DOB: May 04, 1927  MRN: [id number]  Age / Sex: 89 y.o., female  PCP: Donita Brooks, MD Referring Physician: Shon Hale, MD  Reason for Consultation: Establishing goals of care  HPI/Patient Profile: 89 y.o. female  with past medical history of HTN/HLD, hypothyroidism, dementia, osteoporosis, CKD 3, diverticulosis, osteoporosis, abdominal hysterectomy in 1972, bilateral cataract surgery, admitted on 02/21/2023 with closed displaced fracture of the pelvis, subdural hematoma, fall.   Clinical Assessment and Goals of Care: I have reviewed medical records including EPIC notes, labs and imaging, received report from RN, assessed the patient.  Dawn Conner is sitting up in the [place] chair in her room.  She greets me, making and mostly keeping eye contact.  She is alert and oriented to self only with known memory loss.  Her daughter, Dawn Conner, is present at bedside.  We meet at the bedside to discuss diagnosis prognosis, GOC, EOL wishes, disposition and options.  I introduced Palliative Medicine as specialized medical care for people living with serious illness. It focuses on providing relief from the symptoms and stress of a serious illness. The goal is to improve quality of life for both the patient and the family.  We discussed a brief life review of the patient.  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  She lived with her daughter until August of this year when she went to [place] under memory care.  We then focused on their current illness.  We talked about pelvic fracture, any injury or illness, causing decline for those with memory loss.  We talked about the importance of returning her to her known environment.  We talk about how to make choices for loved ones including 1) keeping them at the center  of decision-making 2) are we doing something for them or to them, can we change what is happening and 3) the person her mother was 10 years ago, how would that woman tell Dawn Conner to care for her now.  I share that she and her brother are facing some hard choices soon.  The natural disease trajectory and expectations at EOL were discussed.  Advanced directives, concepts specific to code status, artifical feeding and hydration, and rehospitalization were considered and discussed.  DNR.  Hospice and Palliative Care services outpatient were explained and offered.  We talked about the benefits of outpatient palliative services and hospice services.  We talk in detail about each what is provided for palliative care and hospice care.  Daughter Dawn Conner is agreeable to outpatient "treat the treatable" hospice care if qualified.  She would accept palliative care if not accepted into hospice care.  Discussed the importance of continued conversation with family and the medical providers regarding overall plan of care and treatment options, ensuring decisions are within the context of the patient's values and GOCs. Questions and concerns were addressed. The family was encouraged to call with questions or concerns.  PMT will  continue to support holistically.  Conference with attending, bedside nursing staff, transition of care team related to patient condition, needs, goals of care, disposition.   HCPOA  NEXT OF KIN -daughter, Dawn Conner    SUMMARY OF RECOMMENDATIONS   At this point continue to treat the treatable but no CPR or intubation Return to [facility] where she has been since August of this year with hospice services as qualified. Anticipate repeat hospital stays   Code Status/Advance Care Planning: DNR  Symptom Management:  Per hospitalist, no additional needs at this time.  Palliative Prophylaxis:  Frequent Pain Assessment and Oral Care  Additional Recommendations (Limitations, Scope,  Preferences): Continue to treat but no CPR or intubation  Psycho-social/Spiritual:  Desire for further Chaplaincy support:no Additional Recommendations: Caregiving  Support/Resources and Education on Hospice  Prognosis:  < 6 months, would not be surprising based on advanced age, chronic illness burden, decreasing functional status.  Discharge Planning: Return to [facility] with hospice if qualified      Primary Diagnoses: Present on Admission:  Closed displaced fracture of pelvis (HCC)  Subdural hematoma (HCC)  Scalp wound  Leukocytosis  Acquired hypothyroidism  Chronic kidney disease, stage 3b (HCC)  Dementia without behavioral disturbance (HCC)  UTI (urinary tract infection)  Vascular dementia (HCC)  Essential hypertension   I have reviewed the medical record, interviewed the patient and family, and examined the patient. The following aspects are pertinent.  Past Medical History:  Diagnosis Date   Dementia (HCC)    Diverticulosis    Hyperlipidemia    Hypertension    Osteoporosis    Oth fracture of shaft of right humerus, init for opn fx 1990's   Renal insufficiency    Thyroid disease    Social History   Socioeconomic History   Marital status: Widowed    Spouse name: Not on file   Number of children: Not on file   Years of education: Not on file   Highest education level: Not on file  Occupational History   Not on file  Tobacco Use   Smoking status: Never   Smokeless tobacco: Never  Vaping Use   Vaping status: Never Used  Substance and Sexual Activity   Alcohol use: Never   Drug use: Never   Sexual activity: Not Currently  Other Topics Concern   Not on file  Social History Narrative   Lives with daughter Dawn Conner and son in Social worker.    Social Determinants of Health   Financial Resource Strain: Low Risk  (05/03/2022)   Overall Financial Resource Strain (CARDIA)    Difficulty of Paying Living Expenses: Not hard at all  Food Insecurity: No Food  Insecurity (02/21/2023)   Hunger Vital Sign    Worried About Running Out of Food in the Last Year: Never true    Ran Out of Food in the Last Year: Never true  Transportation Needs: No Transportation Needs (02/21/2023)   PRAPARE - Administrator, Civil Service (Medical): No    Lack of Transportation (Non-Medical): No  Physical Activity: Insufficiently Active (05/03/2022)   Exercise Vital Sign    Days of Exercise per Week: 3 days    Minutes of Exercise per Session: 10 min  Stress: No Stress Concern Present (05/03/2022)   [employer] of Occupational Health - Occupational Stress Questionnaire    Feeling of Stress : Only a little  Social Connections: Moderately Isolated (05/03/2022)   Social Connection and Isolation Panel [NHANES]    Frequency of Communication with  Friends and Family: Twice a week    Frequency of Social Gatherings with Friends and Family: Twice a week    Attends Religious Services: 1 to 4 times per year    Active Member of [employer] or Organizations: No    Attends Banker Meetings: Never    Marital Status: Widowed   Family History  Problem Relation Age of Onset   Colon cancer Neg Hx    Scheduled Meds:  acetaminophen  1,000 mg Oral TID PC & HS   calcium carbonate  1 tablet Oral Q breakfast   donepezil  10 mg Oral QHS   levothyroxine  75 mcg Oral Q0600   lidocaine  1 patch Transdermal Q24H   lipase/protease/amylase  36,000 Units Oral TID WC   Continuous Infusions:  sodium chloride 83 mL/hr at 02/24/23 0856   cefTRIAXone (ROCEPHIN)  IV 200 mL/hr at 02/23/23 1748   PRN Meds:.ondansetron **OR** ondansetron (ZOFRAN) IV, oxyCODONE Medications Prior to Admission:  Prior to Admission medications   Medication Sig Start Date End Date Taking? Authorizing Provider  Aspirin 81 MG CAPS Take 81 mg by mouth in the morning.   Yes [provider]  calcium carbonate (OSCAL) 1500 (600 Ca) MG TABS tablet Take 1,200 mg of elemental calcium by mouth at  bedtime.   Yes [provider]  calcium carbonate (TUMS - DOSED IN MG ELEMENTAL CALCIUM) 500 MG chewable tablet Chew 1 tablet by mouth 3 (three) times daily with meals.   Yes [provider]  Cholecalciferol (VITAMIN D3) 25 MCG (1000 UT) CAPS Take 1-2 capsules (1,000-2,000 Units total) by mouth 2 (two) times daily. 1 tablet in morning and 2 tablets in evening 09/18/21  Yes Johnson, Clanford L, MD  donepezil (ARICEPT) 10 MG tablet Take 1 tablet (10 mg total) by mouth at bedtime. 10/10/22  Yes Donita Brooks, MD  ibuprofen (ADVIL) 200 MG tablet Take 200 mg by mouth every 4 (four) hours as needed for mild pain (pain score 1-3) or moderate pain (pain score 4-6).   Yes [provider]  levothyroxine (SYNTHROID) 75 MCG tablet TAKE 1 TABLET BY MOUTH BEFORE BREAKFAST 01/04/22  Yes Donita Brooks, MD  loperamide (IMODIUM A-D) 2 MG tablet Take 2 mg by mouth every 6 (six) hours as needed for diarrhea or loose stools.   Yes [provider]  Multiple Vitamins-Minerals (MULTIVITAMIN ADULTS 50+ PO) Take 1 tablet by mouth daily.   Yes [provider]  Wheat Dextrin (BENEFIBER) CHEW Chew 1 tablet by mouth in the morning and at bedtime. 02/10/23  Yes Letta Median, PA-C  Zinc Oxide 12 % CREA Apply topically 2 (two) times daily.   Yes [provider]   Allergies  Allergen Reactions   Sulfa Antibiotics    Review of Systems  Unable to perform ROS: Dementia    Physical Exam Constitutional:      General: She is not in acute distress.    Appearance: She is normal weight.  HENT:     Mouth/Throat:     Mouth: Mucous membranes are moist.  Cardiovascular:     Rate and Rhythm: Normal rate.  Pulmonary:     Effort: Pulmonary effort is normal. No respiratory distress.  Skin:    General: Skin is warm and dry.  Neurological:     Mental Status: She is alert.     Comments: Known dementia, able to tell me name.   Psychiatric:        Mood and Affect:  Mood  normal.        Behavior: Behavior normal.     Vital Signs: BP 123/86 (BP Location: Right Arm)   Pulse 72   Temp (!) 97.3 F (36.3 C) (Oral)   Resp 16   Ht 4\' 11"  (1.499 m)   Wt 47.6 kg   SpO2 96%   BMI 21.20 kg/m  Pain Scale: Faces   Pain Score: 0-No pain   SpO2: SpO2: 96 % O2 Device:SpO2: 96 % O2 Flow Rate: .   IO: Intake/output summary:  Intake/Output Summary (Last 24 hours) at 02/24/2023 1403 Last data filed at 02/24/2023 0612 Gross per 24 hour  Intake 33.7 ml  Output 350 ml  Net -316.3 ml    LBM:   Baseline Weight: Weight: 44.8 kg Most recent weight: Weight: 47.6 kg     Palliative Assessment/Data:     Time In: 1200 Time Out: 1315 Time Total: 75 minutes  Greater than 50%  of this time was spent counseling and coordinating care related to the above assessment and plan.  Signed by: Katheran Awe, NP   Please contact Palliative Medicine Team phone at [phone] for questions and concerns.  For individual provider: See Loretha Stapler

## 2023-02-24 NOTE — Plan of Care (Signed)
  Problem: Acute Rehab PT Goals(only PT should resolve) Goal: Pt Will Go Supine/Side To Sit Outcome: Progressing Flowsheets (Taken 02/24/2023 1355) Pt will go Supine/Side to Sit: with minimal assist Goal: Patient Will Transfer Sit To/From Stand Outcome: Progressing Flowsheets (Taken 02/24/2023 1355) Patient will transfer sit to/from stand: with minimal assist Goal: Pt Will Transfer Bed To Chair/Chair To Bed Outcome: Progressing Flowsheets (Taken 02/24/2023 1355) Pt will Transfer Bed to Chair/Chair to Bed: with min assist Goal: Pt Will Ambulate Outcome: Progressing Flowsheets (Taken 02/24/2023 1355) Pt will Ambulate:  25 feet  with minimal assist  with moderate assist  with rolling walker   1:55 PM, 02/24/23 Ocie Bob, MPT Physical Therapist with [facility] [phone] office [phone] mobile phone

## 2023-02-24 NOTE — Progress Notes (Signed)
Transition of Care [facility]) - Inpatient Brief Assessment   Patient Details  Name: Dawn Conner MRN: [id number] Date of Birth: 06-09-1927  Transition of Care [facility]) CM/SW Contact:    Dawn Mausolf A Vedha Tercero, RN Phone Number: 02/24/2023, 4:43 PM   Clinical Narrative: Admitted with closed pelvic fracture. Patient has had slow decline and has become very dehydrated. Patient has been consulted by Palliative, PT/OT.  Daughter has been at the bedside and has decided patient will go back to [place] with home health. Patient will be discharging back to [facility] tomorrow. Chip Boer is accepting patient and request hospital bed, wheelchair, and rolling walker. [employer] [facility] notified of DME request and walker will have to be paid out of pocket. Daughter aware. Chip Boer uses Archivist for [employer]. Notified [facility] of needing HH PT/OT services. Hospitalist to put in orders for all of the above. No further TOC needs identified at this time. If any further TOC needs arise please place consult.    Transition of Care Asessment: Insurance and Status: Insurance coverage has been reviewed Patient has primary care physician: Yes Home environment has been reviewed: ALF Prior level of function:: Independent w/assistance Prior/Current Home Services: No current home services Social Determinants of Health Reivew: SDOH reviewed no interventions necessary Readmission risk has been reviewed: Yes Transition of care needs: transition of care needs identified, TOC will continue to follow

## 2023-02-24 NOTE — Progress Notes (Addendum)
Initial Nutrition Assessment  DOCUMENTATION CODES:   Non-severe (moderate) malnutrition in context of social or environmental circumstances  INTERVENTION:   Liberalize diet to regular, low lactose. Lactaid milk with meals BID. Boost Breeze po TID, each supplement provides 250 kcal and 9 grams of protein.  NUTRITION DIAGNOSIS:   Moderate Malnutrition related to chronic illness, social / environmental circumstances (dementia) as evidenced by mild fat depletion, moderate fat depletion, moderate muscle depletion, severe muscle depletion.  GOAL:   Patient will meet greater than or equal to 90% of their needs  MONITOR:   PO intake, Supplement acceptance  REASON FOR ASSESSMENT:   Malnutrition Screening Tool    ASSESSMENT:   89 yo female admitted with closed displaced fx of pelvis, trace L parafalcine SDH S/P fall at SNF. PMH includes HTN, thyroid disease, HLD, osteoporosis, diverticulosis, dementia, renal insufficiency.  Pelvic fracture is being treated non-surgically.   Spoke with patient's daughter who reports that patient has lactose intolerance. She typically likes lactose free milk, but hasn't had any since she's been sick. She limits cruciferous vegetables as well d/t causing loose stools r/t diverticulosis. Patient has not lost any weight per daughter. Review of weight encounters supports no weight loss.   Patient has been eating 10-50% of meals up until this morning, when she refused breakfast. Daughter is encouraging patient to eat and ordering foods that she thinks patient will eat. Patient tried boost breeze supplement during RD visit and liked it. Daughter to encourage intake of meals and supplements.   Weight history reviewed. No significant weight changes noted PTA.   NUTRITION - FOCUSED PHYSICAL EXAM:  Flowsheet Row Most Recent Value  Orbital Region Moderate depletion  Upper Arm Region Moderate depletion  Thoracic and Lumbar Region Mild depletion  Buccal Region  Moderate depletion  Temple Region Moderate depletion  Clavicle Bone Region Severe depletion  Clavicle and Acromion Bone Region Severe depletion  Scapular Bone Region Severe depletion  Dorsal Hand Severe depletion  Patellar Region Unable to assess  Anterior Thigh Region Unable to assess  Posterior Calf Region Moderate depletion  Edema (RD Assessment) Mild  Hair Reviewed  Eyes Reviewed  Mouth Reviewed  Skin Reviewed  Nails Reviewed       Diet Order:   Diet Order             Diet heart healthy/carb modified Room service appropriate? Yes; Fluid consistency: Thin  Diet effective now                   EDUCATION NEEDS:   No education needs have been identified at this time  Skin:  Skin Assessment: Reviewed RN Assessment  Last BM:  11/2  Height:   Ht Readings from Last 1 Encounters:  02/21/23 4\' 11"  (1.499 m)    Weight:   Wt Readings from Last 1 Encounters:  02/21/23 47.6 kg    Ideal Body Weight:  44.7 kg  BMI:  Body mass index is 21.2 kg/m.  Estimated Nutritional Needs:   Kcal:  1300-1500  Protein:  60-75 gm  Fluid:  1.5 L   Gabriel Rainwater RD, LDN, CNSC Please refer to Amion for contact information.

## 2023-02-24 NOTE — Progress Notes (Signed)
SLP Cancellation Note  Patient Details Name: Dawn Conner MRN: [id number] DOB: [birth year]   Cancelled treatment:       Reason Eval/Treat Not Completed: SLP screened, no needs identified, will sign off; SLE was ordered, however SLP spoke with Pt and her daughter in room. Daughter reports that Pt initially has "slurred speech", but that this resolved and she thinks it was due to pain medication. She reports that her mother has been living at [facility] for the past 2 months in the memory care unit due to baseline dementia. Pt spoke briefly with SLP and stated that she was doing fine, but had a headache. No reports of dysphagia and speech is back to baseline. Will defer SLE at this time due to baseline memory deficits and daughter indicates that Pt is back to baseline.   Thank you,  Havery Moros, CCC-SLP [phone]    Cecile Guevara 02/24/2023, 4:39 PM

## 2023-02-24 NOTE — TOC CM/SW Note (Signed)
Patient requires frequent re-positioning of the body in ways that cannot be achieved with  an ordinary bed or wedge pillow, to eliminate pain, reduce pressure, and the head of the  bed to be elevated more than 30 degrees most of the time due to closed pelvic fracture.

## 2023-02-24 NOTE — Progress Notes (Signed)
Dawn Conner [facility] Liaison Note:  Notified by [facility] manager of patient/family request for [facility] Palliative services at home after discharge.   Spoke with daughter at length about Hospice and palliative care services. At this time daughter is anticipating PT at facility post discharge and would not be eligible to receive hospice services until completion of rehab.   Please call with any hospice or outpatient palliative care related questions.   Thank you for the opportunity to participate in this patient's care.   Glenna Fellows, BSN, RN, OCN [employer] [phone]

## 2023-02-24 NOTE — Progress Notes (Signed)
PROGRESS NOTE  Dawn Conner  WCB:[id number] DOB: 09/11/1927 DOA: 02/21/2023 PCP: Donita Brooks, MD    Brief Narrative:  Dawn Conner is a 89 y.o. female from [place] with medical history significant of tension, hyperlipidemia, hypothyroidism, dementia, osteoporosis and CKD 3B who presents to the emergency department due to a fall from standing position today.   Had syncopal episodes in past.   Assessment and Plan: 1)Closed displaced fracture of the pelvis CT pelvis without contrast showed pelvic bone fractures  Orthopedic was consulted and no indication for any surgical intervention, but to continue to bear weight as tolerated. -schedule tylenol, PRN oxy - fall precautions - PT/OT eval and treat pending (-Patient was Not cooperating with PT evaluation due to confusion)   2)Subdural hematoma CT head without contrast showed trace left parafalcine subdural hematoma, 2 mm in thickness Neurosurgery NP was consulted and recommended a repeat CT of head in 4 to 6 hours.  This was done and there was no change in patient's subdural hematoma.   3)Fall at home - fall precaution -PT/OT eval and treat -outpatient syncope work up in progress by cardiology with event monitor   4)Scalp wound Patient sustained laceration to posterior scalp due to fall, this was repaired wit staples-- these will need to be removed Continue wound care   5)Presumed UTI--- UA from 02/22/2023 suggestive of UTI  WBC 14.4 >>12.0 >>12.9 -IV Rocephin pending urine culture  6)RLE swelling > LLE -duplex without DVT   7)Pancreatic insufficiency Continue Creon   8)AAA CT pelvis without contrast showed a 4 cm.  Fusiform distal abdominal aortic aneurysm Follow-up CT or MRI in 12 months and referral to all continued care with vascular specialist was recommended by radiologist   9)Aki on CKD 3B Creatinine up to 1.7 (was  1.1 on 10/11/22) Creatinine was 1.3 on admission - renally adjust medications, avoid nephrotoxic  agents / dehydration  / hypotension Gentle iv Hydration until oral intake improves - encourage PO intake  10)Acquired hypothyroidism Continue Synthroid   11)Advanced dementia: Patient is not oriented to time and place. She is only oriented to self. Continue donepezil 10 mg nightly, follow-up with PCP.   12)Social/Ethics--official palliative care consult requested to further delineate goals of care  -patient is a DNR  DVT prophylaxis: SCDs Start: 02/21/23 2016    Code Status: Limited: Do not attempt resuscitation (DNR) -DNR-LIMITED -Do Not Intubate/DNI  Family Communication: Discussed with [name]  Disposition Plan: Anticipate discharge to SNF if PT is able to do a PT eval on her Level of care: Med-Surg Status is: Inpatient Remains inpatient appropriate     Consultants:  NS (phone) Ortho (phone)   Subjective: - -Refusing to eat or drink -- Remains pleasantly confused -Not very cooperative with PT OT eval [name] at bedside  Objective: Vitals:   02/22/23 1316 02/23/23 1443 02/23/23 2030 02/24/23 0500  BP: (!) 121/51 (!) 112/95 (!) 137/105 123/86  Pulse: 68 98 93 72  Resp:   16 16  Temp: 98.5 F (36.9 C) 98.5 F (36.9 C) 98.5 F (36.9 C) (!) 97.3 F (36.3 C)  TempSrc:  Oral Oral Oral  SpO2: 95% 100% 96% 96%  Weight:      Height:        Intake/Output Summary (Last 24 hours) at 02/24/2023 1117 Last data filed at 02/24/2023 0612 Gross per 24 hour  Intake 33.7 ml  Output 350 ml  Net -316.3 ml   Filed Weights   02/21/23 1116 02/21/23 2057  Weight:  44.8 kg 47.6 kg   Physical Exam Gen:- Awake Alert, in no acute distress , pleasantly confused, oriented x 1 HEENT:- -hemostatic posterior scalp wound/laceration repaired Neck-Supple Neck,No JVD,.  Lungs-  CTAB , fair air movement bilaterally  CV- S1, S2 normal, RRR Abd-  +ve B.Sounds, Abd Soft, No tenderness,    Extremity/Skin:- No  edema,   good pedal pulses , left forearm abrasion Psych-affect is  appropriate, oriented x1, pleasantly confused with obvious cognitive and memory deficits Neuro-generalized weakness, no new focal deficits, no tremors MSK-limited range of motion due to pelvic area discomfort with positional change and movement, especially left hip area   Data Reviewed: I have personally reviewed following labs and imaging studies  CBC: Recent Labs  Lab 02/21/23 1553 02/22/23 0352 02/23/23 0459  WBC 14.4* 12.0* 12.9*  NEUTROABS 11.6*  --   --   HGB 11.4* 9.8* 9.1*  HCT 37.9 32.8* 29.7*  MCV 89.0 88.9 88.4  PLT 233 205 174   Basic Metabolic Panel: Recent Labs  Lab 02/21/23 1553 02/22/23 0352 02/23/23 0459  NA 136 137 136  K 3.9 4.9 4.3  CL 104 104 103  CO2 25 24 24   GLUCOSE 151* 172* 138*  BUN 26* 33* 45*  CREATININE 1.32* 1.46* 1.71*  CALCIUM 8.5* 8.3* 8.3*  MG  --  2.3  --   PHOS  --  5.1*  --    GFR: Estimated Creatinine Clearance: 14 mL/min (A) (by C-G formula based on SCr of 1.71 mg/dL (H)). Liver Function Tests: Recent Labs  Lab 02/22/23 0352  AST 28  ALT 22  ALKPHOS 44  BILITOT 0.7  PROT 6.5  ALBUMIN 2.9*   Recent Results (from the past 240 hour(s))  MRSA Next Gen by PCR, Nasal     Status: None   Collection Time: 02/22/23 11:37 AM   Specimen: Nasal Mucosa; Nasal Swab  Result Value Ref Range Status   MRSA by PCR Next Gen NOT DETECTED NOT DETECTED Final    Comment: (NOTE) The GeneXpert MRSA Assay (FDA approved for NASAL specimens only), is one component of a comprehensive MRSA colonization surveillance program. It is not intended to diagnose MRSA infection nor to guide or monitor treatment for MRSA infections. Test performance is not FDA approved in patients less than 63 years old. Performed at [facility], [address], [place], [place] [address]     Scheduled Meds:  acetaminophen  1,000 mg Oral TID PC & HS   calcium carbonate  1 tablet Oral Q breakfast   donepezil  10 mg Oral QHS   levothyroxine  75 mcg Oral Q0600    lidocaine  1 patch Transdermal Q24H   lipase/protease/amylase  36,000 Units Oral TID WC   Continuous Infusions:  sodium chloride 83 mL/hr at 02/24/23 0856   cefTRIAXone (ROCEPHIN)  IV 200 mL/hr at 02/23/23 1748    LOS: 3 days   Shon Hale, MD [facility] Available via Epic secure chat 7am-7pm After these hours, please refer to coverage provider listed on [email or web address] 02/24/2023, 11:17 AM

## 2023-02-24 NOTE — Evaluation (Signed)
Physical Therapy Evaluation Patient Details Name: Dawn Conner MRN: [id number] DOB: February 22, 1928 Today's Date: 02/24/2023  History of Present Illness  Dawn Conner is a 89 y.o. female with medical history significant of tension, hyperlipidemia, hypothyroidism, dementia, osteoporosis and CKD 3B who presents to the emergency department due to a fall from standing position today.  Patient was unable to provide history possibly due to underlying dementia history was obtained from ED physician and ED medical record.  Patient was unable to provide history, history was obtained from ED physician and ED medical record, per report patient fell backwards and hit her head.  She complains of left hip pain and headache after the fall.  No further detailed history was obtainable at this time.   Clinical Impression  Patient presents confused and requires Max verbal/tactile cueing for participating with therapy, later in day with her daughter present patient was more cooperative.  Patient demonstrates slow labored movement for sitting up at bedside with difficulty moving LLE due to increased hip pain, very unsteady on feet with fair/poor carryover for using RW  and limited to a few steps at bedside before having to sit due to fatigue, poor standing balance and fall risk.  Patient tolerated sitting up in chair after therapy with her daughter present.  Patient will benefit from continued skilled physical therapy in hospital and recommended venue below to increase strength, balance, endurance for safe ADLs and gait.           If plan is discharge home, recommend the following: A lot of help with bathing/dressing/bathroom;A lot of help with walking and/or transfers;Help with stairs or ramp for entrance;Assistance with cooking/housework   Can travel by private vehicle   No    Equipment Recommendations Rolling walker (2 wheels)  Recommendations for Other Services       Functional Status Assessment Patient has had a  recent decline in their functional status and demonstrates the ability to make significant improvements in function in a reasonable and predictable amount of time.     Precautions / Restrictions Precautions Precautions: Fall Restrictions Weight Bearing Restrictions: No RLE Weight Bearing: Weight bearing as tolerated      Mobility  Bed Mobility Overal bed mobility: Needs Assistance Bed Mobility: Supine to Sit     Supine to sit: Mod assist     General bed mobility comments: slow labored movement requiring repeated verbal/tactile cueing for following instructions    Transfers Overall transfer level: Needs assistance Equipment used: Rolling walker (2 wheels) Transfers: Sit to/from Stand, Bed to chair/wheelchair/BSC Sit to Stand: Mod assist   Step pivot transfers: Mod assist       General transfer comment: unsteady labored movement with c/o increased left hip pain    Ambulation/Gait Ambulation/Gait assistance: Mod assist, Max assist Gait Distance (Feet): 4 Feet Assistive device: Rolling walker (2 wheels) Gait Pattern/deviations: Decreased step length - right, Decreased step length - left, Decreased stride length Gait velocity: slow     General Gait Details: limited to a few slow labored side steps, steps forward/backwards with poor tolerance for weightbearing on LLE due to increasing pain  Stairs            Wheelchair Mobility     Tilt Bed    Modified Rankin (Stroke Patients Only)       Balance Overall balance assessment: Needs assistance Sitting-balance support: Feet supported, No upper extremity supported Sitting balance-Leahy Scale: Fair Sitting balance - Comments: seated at EOB   Standing balance support: Bilateral upper extremity supported,  During functional activity, Reliant on assistive device for balance Standing balance-Leahy Scale: Poor Standing balance comment: using RW                             Pertinent Vitals/Pain Pain  Assessment Pain Assessment: Faces Faces Pain Scale: Hurts even more Pain Location: pelvic area Pain Descriptors / Indicators: Grimacing, Guarding, Sharp, Discomfort Pain Intervention(s): Limited activity within patient's tolerance, Monitored during session, Premedicated before session, Repositioned    Home Living Family/patient expects to be discharged to:: Assisted living                 Home Equipment: None      Prior Function               Mobility Comments: Household ambulator without AD ADLs Comments: Assisted by ALF staff     Extremity/Trunk Assessment   Upper Extremity Assessment Upper Extremity Assessment: Defer to OT evaluation    Lower Extremity Assessment Lower Extremity Assessment: Generalized weakness    Cervical / Trunk Assessment Cervical / Trunk Assessment: Kyphotic  Communication   Communication Communication: Hearing impairment Cueing Techniques: Verbal cues;Tactile cues  Cognition Arousal: Alert Behavior During Therapy: Anxious, Agitated Overall Cognitive Status: History of cognitive impairments - at baseline                                          General Comments      Exercises     Assessment/Plan    PT Assessment Patient needs continued PT services  PT Problem List Decreased strength;Decreased activity tolerance;Decreased balance;Decreased mobility       PT Treatment Interventions DME instruction;Gait training;Functional mobility training;Therapeutic activities;Therapeutic exercise;Balance training;Patient/family education    PT Goals (Current goals can be found in the Care Plan section)  Acute Rehab PT Goals Patient Stated Goal: return home PT Goal Formulation: With patient/family Time For Goal Achievement: 03/10/23 Potential to Achieve Goals: Good    Frequency Min 3X/week     Co-evaluation PT/OT/SLP Co-Evaluation/Treatment: Yes Reason for Co-Treatment: Complexity of the patient's impairments  (multi-system involvement);To address functional/ADL transfers PT goals addressed during session: Mobility/safety with mobility;Balance;Proper use of DME         AM-PAC PT "6 Clicks" Mobility  Outcome Measure Help needed turning from your back to your side while in a flat bed without using bedrails?: A Lot Help needed moving from lying on your back to sitting on the side of a flat bed without using bedrails?: A Lot Help needed moving to and from a bed to a chair (including a wheelchair)?: A Lot Help needed standing up from a chair using your arms (e.g., wheelchair or bedside chair)?: A Lot Help needed to walk in hospital room?: A Lot Help needed climbing 3-5 steps with a railing? : Total 6 Click Score: 11    End of Session Equipment Utilized During Treatment: Gait belt Activity Tolerance: Patient tolerated treatment well;Patient limited by fatigue Patient left: in chair;with call bell/phone within reach Nurse Communication: Mobility status PT Visit Diagnosis: Unsteadiness on feet (R26.81);Other abnormalities of gait and mobility (R26.89);Muscle weakness (generalized) (M62.81)    Time: 1610-9604 PT Time Calculation (min) (ACUTE ONLY): 20 min   Charges:   PT Evaluation $PT Eval Moderate Complexity: 1 Mod PT Treatments $Therapeutic Activity: 8-22 mins PT General Charges $$ ACUTE PT VISIT: 1 Visit  1:54 PM, 02/24/23 Ocie Bob, MPT Physical Therapist with [facility] [phone] office [phone] mobile phone

## 2023-02-24 NOTE — Progress Notes (Addendum)
Pt refused to eat breakfast this AM. Encouraged pt to drink more fluids d/t dehydration. Daughter has been bedside after breakfast and has assisted with meals. Pt ate at least 50% of lunch and dinner, assisted. No c/o pain or discomfort at this time. Will continue to monitor.

## 2023-02-25 DIAGNOSIS — N1832 Chronic kidney disease, stage 3b: Secondary | ICD-10-CM | POA: Diagnosis not present

## 2023-02-25 DIAGNOSIS — W19XXXA Unspecified fall, initial encounter: Secondary | ICD-10-CM | POA: Diagnosis not present

## 2023-02-25 DIAGNOSIS — F0390 Unspecified dementia without behavioral disturbance: Secondary | ICD-10-CM | POA: Diagnosis not present

## 2023-02-25 DIAGNOSIS — Z515 Encounter for palliative care: Secondary | ICD-10-CM | POA: Diagnosis not present

## 2023-02-25 DIAGNOSIS — S065XAA Traumatic subdural hemorrhage with loss of consciousness status unknown, initial encounter: Secondary | ICD-10-CM | POA: Diagnosis not present

## 2023-02-25 DIAGNOSIS — Z7189 Other specified counseling: Secondary | ICD-10-CM | POA: Diagnosis not present

## 2023-02-25 DIAGNOSIS — S329XXA Fracture of unspecified parts of lumbosacral spine and pelvis, initial encounter for closed fracture: Secondary | ICD-10-CM | POA: Diagnosis not present

## 2023-02-25 LAB — RENAL FUNCTION PANEL
Albumin: 2.1 g/dL — ABNORMAL LOW (ref 3.5–5.0)
Anion gap: 5 (ref 5–15)
BUN: 37 mg/dL — ABNORMAL HIGH (ref 8–23)
CO2: 24 mmol/L (ref 22–32)
Calcium: 8 mg/dL — ABNORMAL LOW (ref 8.9–10.3)
Chloride: 108 mmol/L (ref 98–111)
Creatinine, Ser: 1.04 mg/dL — ABNORMAL HIGH (ref 0.44–1.00)
GFR, Estimated: 50 mL/min — ABNORMAL LOW (ref >60)
Glucose, Bld: 99 mg/dL (ref 70–99)
Phosphorus: 1.9 mg/dL — ABNORMAL LOW (ref 2.5–4.6)
Potassium: 3.9 mmol/L (ref 3.5–5.1)
Sodium: 137 mmol/L (ref 135–145)

## 2023-02-25 MED ORDER — OXYCODONE HCL 5 MG PO TABS: 5.0000 mg | ORAL_TABLET | ORAL | 0 refills | Status: DC | PRN

## 2023-02-25 MED ORDER — LIDOCAINE 5 % EX PTCH: 1.0000 | MEDICATED_PATCH | CUTANEOUS | 0 refills | Status: DC

## 2023-02-25 MED ORDER — ACETAMINOPHEN 325 MG PO TABS: 650.0000 mg | ORAL_TABLET | ORAL | 1 refills | Status: AC | PRN

## 2023-02-25 MED ORDER — POTASSIUM PHOSPHATES 15 MMOLE/5ML IV SOLN
30.0000 mmol | Freq: Once | INTRAVENOUS | Status: AC
  Administered 2023-02-25: 30 mmol via INTRAVENOUS
  Filled 2023-02-25: qty 10

## 2023-02-25 MED ORDER — ENSURE ACTIVE HIGH PROTEIN PO LIQD: 1.0000 | Freq: Four times a day (QID) | ORAL | 3 refills | Status: AC

## 2023-02-25 NOTE — Progress Notes (Signed)
Palliative: Dawn Conner is resting quietly in bed.  She appears chronically ill and somewhat frail.  She is resting comfortably, but wakes easily when I call her name.  She has known dementia therefore I do not ask her orientation questions.  I do believe that she can make her basic needs known.  There is no family at bedside at this time.  I offer Dawn Conner something to eat and drink but she declines.  She smiles pleasantly.  We talked about returning to her home today.  Face-to-face conference with transition of care team and bedside nursing staff related to patient condition, needs, goals of care, disposition.  Plan: Return to [facility], where she has been and since approximately August of this year, with home health services and outpatient palliative services with [facility].  35 minutes Dawn Carmel, NP Palliative medicine team Team phone [phone]

## 2023-02-25 NOTE — Discharge Summary (Addendum)
Dawn Conner, is a 89 y.o. female  DOB October 04, 1927  MRN [id number].  Admission date:  02/21/2023  Admitting Physician  Frankey Shown, DO  Discharge Date:  02/25/2023   Primary MD  Donita Brooks, MD  Recommendations for primary care physician for things to follow:  1) discharge back to [facility] assisted living facility with palliative care/hospice following  Admission Diagnosis  Subdural hematoma (HCC) [S06.5XAA] Displaced fracture of pelvis (HCC) [S32.9XXA] Closed displaced fracture of pelvis, unspecified part of pelvis, initial encounter (HCC) [S32.9XXA]   Discharge Diagnosis  Subdural hematoma (HCC) [S06.5XAA] Displaced fracture of pelvis (HCC) [S32.9XXA] Closed displaced fracture of pelvis, unspecified part of pelvis, initial encounter (HCC) [S32.9XXA]    Principal Problem:   Closed displaced fracture of pelvis (HCC) Active Problems:   UTI (urinary tract infection)   Essential hypertension   Acquired hypothyroidism   Vascular dementia (HCC)   Leukocytosis   Fall at home, initial encounter   Dementia without behavioral disturbance (HCC)   Subdural hematoma (HCC)   Scalp wound   AAA (abdominal aortic aneurysm) (HCC)   Pancreatic insufficiency   Chronic kidney disease, stage 3b (HCC)   Localized swelling of right lower extremity   Malnutrition of moderate degree      Past Medical History:  Diagnosis Date   Dementia (HCC)    Diverticulosis    Hyperlipidemia    Hypertension    Osteoporosis    Oth fracture of shaft of right humerus, init for opn fx 1990's   Renal insufficiency    Thyroid disease     Past Surgical History:  Procedure Laterality Date   ABDOMINAL HYSTERECTOMY  1972   APPENDECTOMY     cataract surgery     Early 2000's both eyes    CHOLECYSTECTOMY  1980's   COLONOSCOPY  03/10/2006   [place]; severe diverticulosis especially involving the sigmoid and to a lesser  extent all other segments as well.  There was no evidence of neoplastic pathology. Recommended continuing Metamucil as well as Questran which appeared to keep her fairly regular. Consider repeat in 5 years.   OVARIAN CYST REMOVAL  09/2012   TONSILLECTOMY  age 66       HPI  from the history and physical done on the day of admission:   HPI: Dawn Conner is a 89 y.o. female with medical history significant of tension, hyperlipidemia, hypothyroidism, dementia, osteoporosis and CKD 3B who presents to the emergency department due to a fall from standing position today.  Patient was unable to provide history possibly due to underlying dementia history was obtained from ED physician and ED medical record.  Patient was unable to provide history, history was obtained from ED physician and ED medical record, per report patient fell backwards and hit her head.  She complains of left hip pain and headache after the fall.  No further detailed history was obtainable at this time.   ED Course:  In the emergency department, BP was 152/107, other vital signs were within normal range.  Workup in the ED  showed leukocytosis and normocytic anemia.  BMP was normal except for blood glucose of 151, BUN/creatinine 26/1.32 (baseline creatinine at 1.0-1.2). CT head without contrast showed trace left parafalcine subdural hematoma, 2 mm in thickness.  No brain contact.  Left posterior scalp wound without fracture.  Negative for cervical spine fracture or subluxation CT pelvis without contrast showed pelvic bone fractures with moderate hematoma along the pelvic floor anterior and to the left side of the urinary bladder. Patient was treated with Tylenol, oxycodone 5 mg orally x 1 was given. NP Cosentino who was covering for neurosurgery was consulted and recommended repeat CT scan in 4 to 6 hours. Dr. Aundria Rud (orthopedics) was consulted and recommended weightbearing as tolerated. Continue to have significant pain and which was worse  on bearing weight, it was then decided for patient to be admitted.  [facility] was called to admit patient for further evaluation and management.     Review of Systems: Review of systems as noted in the HPI. All other systems reviewed and are negative.   Hospital Course:   Assessment and Plan: 1)Closed displaced fracture of the pelvis CT pelvis without contrast showed pelvic bone fractures  Orthopedic was consulted and no indication for any surgical intervention, but to continue to bear weight as tolerated. -Continue Tylenol and oxycodone as needed --- fall precautions - PT/OT eval --appreciated --Discharge back to [facility] with palliative care and possibly hospice following   2)Subdural hematoma CT head without contrast showed trace left parafalcine subdural hematoma, 2 mm in thickness Neurosurgery NP was consulted and recommended a repeat CT of head in 4 to 6 hours.  This was done and there was no change in patient's subdural hematoma. --Supportive care   3)Fall at [facility] (which is the patient's place of residence) - fall precaution -PT/OT eval appreciated --Discharge back to [facility] with palliative care and possibly hospice following   4)Scalp wound Patient sustained laceration to posterior scalp due to fall, this was repaired wit staples-- these will need to be removed Continue wound care   5)Presumed UTI--- UA from 02/22/2023 suggestive of UTI  WBC 14.4 >>12.0 >>12.9 -Treated with Rocephin   6)RLE swelling > LLE -duplex without DVT   7)Pancreatic insufficiency Continue Creon   8)AAA CT pelvis without contrast showed a 4 cm.  Fusiform distal abdominal aortic aneurysm Follow-up CT or MRI in 12 months or vascular surgery follow-up recommended by radiologist --Discharge back to [facility] with palliative care and possibly hospice following   9)Aki on CKD 3B Creatinine up to 1.7 (was  1.1 on 10/11/22) Creatinine was 1.3 on admission -Creatinine is back to  1.0 after IV fluids - renally adjust medications, avoid nephrotoxic agents / dehydration  / hypotension - encourage PO intake   10)Acquired hypothyroidism Continue Synthroid   11)Advanced dementia: Patient is not oriented to time and place. She is only oriented to self. Continue donepezil 10 mg nightly, follow-up with PCP.    12)Social/Ethics---patient is a DNR --Discharge back to [facility] with palliative care and possibly hospice following  13) hypophosphatemia--- replaced       Code Status: Limited: Do not attempt resuscitation (DNR) -DNR-LIMITED -Do Not Intubate/DNI  Family Communication: Discussed with [name]   Disposition Plan: --Discharge back to [facility] with palliative care and possibly hospice following  Discharge Condition: Medically stable at this time,  however overall prognosis is poor  Follow UP--with Palliative and hospice team   Consults obtained -palliative care, neurosurgery  Diet and Activity  recommendation:  As advised  Discharge Instructions    Discharge Instructions     Call MD for:  difficulty breathing, headache or visual disturbances   Complete by: As directed    Call MD for:  persistant dizziness or light-headedness   Complete by: As directed    Call MD for:  persistant nausea and vomiting   Complete by: As directed    Call MD for:  severe uncontrolled pain   Complete by: As directed    Call MD for:  temperature >100.4   Complete by: As directed    Diet general   Complete by: As directed    Discharge instructions   Complete by: As directed    1) discharge back to [facility] assisted living facility with palliative care/hospice following   Increase activity slowly   Complete by: As directed    No wound care   Complete by: As directed        Discharge Medications     Allergies as of 02/25/2023       Reactions   Sulfa Antibiotics         Medication List     STOP taking these medications    Aspirin 81 MG  Caps   calcium carbonate 1500 (600 Ca) MG Tabs tablet Commonly known as: OSCAL   calcium carbonate 500 MG chewable tablet Commonly known as: TUMS - dosed in mg elemental calcium   ibuprofen 200 MG tablet Commonly known as: ADVIL   Vitamin D3 25 MCG (1000 UT) Caps       TAKE these medications    acetaminophen 325 MG tablet Commonly known as: Tylenol Take 2 tablets (650 mg total) by mouth every 4 (four) hours as needed.   Benefiber Chew Chew 1 tablet by mouth in the morning and at bedtime.   donepezil 10 MG tablet Commonly known as: ARICEPT Take 1 tablet (10 mg total) by mouth at bedtime.   Ensure Active High Protein Liqd Take 1 Can by mouth in the morning, at noon, in the evening, and at bedtime.   levothyroxine 75 MCG tablet Commonly known as: SYNTHROID TAKE 1 TABLET BY MOUTH BEFORE BREAKFAST   lidocaine 5 % Commonly known as: LIDODERM Place 1 patch onto the skin daily. Apply to affected area---Remove & Discard patch within 12 hours or as directed by MD   lipase/protease/amylase [address] UNITS Cpep capsule Commonly known as: Creon Take 1 capsule (36,000 Units total) by mouth 3 (three) times daily with meals. Take with first bite of food.   loperamide 2 MG tablet Commonly known as: IMODIUM A-D Take 2 mg by mouth every 6 (six) hours as needed for diarrhea or loose stools.   MULTIVITAMIN ADULTS 50+ PO Take 1 tablet by mouth daily.   oxyCODONE 5 MG immediate release tablet Commonly known as: Oxy IR/ROXICODONE Take 1 tablet (5 mg total) by mouth every 4 (four) hours as needed for moderate pain (pain score 4-6).   Zinc Oxide 12 % Crea Apply topically 2 (two) times daily.               Durable Medical Equipment  (From admission, onward)           Start     Ordered   02/24/23 1630  For home use only DME 4 wheeled rolling walker with seat  Once       Question:  Patient needs a walker to treat with the following condition  Answer:  Closed pelvic fracture  (HCC)  02/24/23 1630   02/24/23 1630  For home use only DME high strength lightweight manual wheelchair with seat cushion  Once       Comments: Patient suffers from pelvic fractures which impairs their ability to perform daily activities like bathing, dressing, grooming, and toileting in the home.  A cane, crutch, or walker will not resolve  issue with performing activities of daily living. A wheelchair will allow patient to safely perform daily activities.Length of need Lifetime. (THEN ONE OF THESE TWO:) Patient self-propels the wheelchair while engaging in frequent activities such as meals and toileting which cannot be performed in a standard or lightweight wheelchair due to the weight of the chair. Accessories: elevating leg rests (ELRs), wheel locks, extensions and anti-tippers.   02/24/23 1630   02/24/23 1629  For home use only DME Hospital bed  Once       Question Answer Comment  Length of Need Lifetime   Patient has (list medical condition): Pelvic fractures   The above medical condition requires: Patient requires the ability to reposition frequently   Head must be elevated greater than: 30 degrees   Bed type Semi-electric   Support Surface: Gel Overlay      02/24/23 1630           Major procedures and Radiology Reports - PLEASE review detailed and final reports for all details, in brief -   US Venous Img Lower Unilateral Right (DVT)  Result Date: 02/22/2023 CLINICAL DATA:  Right lower extremity pain for the past day. Evaluate for DVT. EXAM: RIGHT LOWER EXTREMITY VENOUS DOPPLER ULTRASOUND TECHNIQUE: Gray-scale sonography with graded compression, as well as color Doppler and duplex ultrasound were performed to evaluate the lower extremity deep venous systems from the level of the common femoral vein and including the common femoral, femoral, profunda femoral, popliteal and calf veins including the posterior tibial, peroneal and gastrocnemius veins when visible. The superficial great  saphenous vein was also interrogated. Spectral Doppler was utilized to evaluate flow at rest and with distal augmentation maneuvers in the common femoral, femoral and popliteal veins. COMPARISON:  None Available. FINDINGS: Contralateral Common Femoral Vein: Respiratory phasicity is normal and symmetric with the symptomatic side. No evidence of thrombus. Normal compressibility. Common Femoral Vein: No evidence of thrombus. Normal compressibility, respiratory phasicity and response to augmentation. Saphenofemoral Junction: No evidence of thrombus. Normal compressibility and flow on color Doppler imaging. Profunda Femoral Vein: No evidence of thrombus. Normal compressibility and flow on color Doppler imaging. Femoral Vein: No evidence of thrombus. Normal compressibility, respiratory phasicity and response to augmentation. Popliteal Vein: No evidence of thrombus. Normal compressibility, respiratory phasicity and response to augmentation. Calf Veins: No evidence of thrombus. Normal compressibility and flow on color Doppler imaging. Superficial Great Saphenous Vein: No evidence of thrombus. Normal compressibility. Other Findings:  None. IMPRESSION: No evidence of DVT within the right lower extremity. Electronically Signed   By: Simonne Come M.D.   On: 02/22/2023 14:34   CT Head Wo Contrast  Result Date: 02/21/2023 CLINICAL DATA:  4-6 hour follow-up for subdural hematoma EXAM: CT HEAD WITHOUT CONTRAST TECHNIQUE: Contiguous axial images were obtained from the base of the skull through the vertex without intravenous contrast. RADIATION DOSE REDUCTION: This exam was performed according to the departmental dose-optimization program which includes automated exposure control, adjustment of the mA and/or kV according to patient size and/or use of iterative reconstruction technique. COMPARISON:  02/21/2023 12:33 p.m. FINDINGS: Brain: Redemonstrated trace left parafalcine subdural hematoma along the anterior falx, which  measures  up to 2 mm, unchanged from the prior exam. No significant mass effect. No new extra-axial collection is seen. No evidence of acute infarction, parenchymal hemorrhage, mass, mass effect, or midline shift. No hydrocephalus. Periventricular white matter changes, likely the sequela of chronic small vessel ischemic disease. Vascular: No hyperdense vessel. Atherosclerotic calcifications in the intracranial carotid and vertebral arteries. Skull: Negative for fracture or focal lesion. Sinuses/Orbits: No acute finding. Other: The mastoid air cells are well aerated. IMPRESSION: Unchanged trace left parafalcine subdural hematoma along the anterior falx. No significant mass effect. No new extra-axial collection. Electronically Signed   By: Wiliam Ke M.D.   On: 02/21/2023 18:57   CT PELVIS WO CONTRAST  Result Date: 02/21/2023 CLINICAL DATA:  Pelvic fracture. EXAM: CT PELVIS WITHOUT CONTRAST TECHNIQUE: Multidetector CT imaging of the pelvis was performed following the standard protocol without intravenous contrast. RADIATION DOSE REDUCTION: This exam was performed according to the departmental dose-optimization program which includes automated exposure control, adjustment of the mA and/or kV according to patient size and/or use of iterative reconstruction technique. COMPARISON:  Left hip radiograph dated 02/21/2023. FINDINGS: Evaluation of this exam is limited in the absence of intravenous contrast. Urinary Tract: The urinary bladder is partially distended and grossly unremarkable. Bowel: There is severe sigmoid diverticulosis with muscular hypertrophy. Additional scattered colonic diverticula. No bowel dilatation in the pelvis. Vascular/Lymphatic: Advanced aortoiliac atherosclerotic disease. There is a 4 cm fusiform distal abdominal aortic aneurysm. No pelvic adenopathy. Reproductive:  Hysterectomy. Other: Moderate amount of hematoma within the lower pelvis anterior and to the left of the bladder. Musculoskeletal: There  is a comminuted and displaced fracture of the left superior pubic ramus close to the symphysis previous as well as mildly displaced fracture of the left inferior pubic ramus. Nondisplaced fracture of the right superior pubic ramus as well as nondisplaced fractures of the left sacral al a. IMPRESSION: 1. Pelvic bone fractures as above with moderate hematoma along the pelvic floor anterior and to the left of the urinary bladder. 2. Severe sigmoid diverticulosis. 3. A 4 cm fusiform distal abdominal aortic aneurysm. Recommend follow-up CT or MR as appropriate in 12 months and referral to or continued care with vascular specialist. (Ref.: J Vasc Surg. 2018; 67:2-77 and J Am Coll Radiol 2013;10(10):789-794.) 4.  Aortic Atherosclerosis (ICD10-I70.0). Electronically Signed   By: Elgie Collard M.D.   On: 02/21/2023 17:49   DG Hip Unilat With Pelvis 2-3 Views Left  Result Date: 02/21/2023 CLINICAL DATA:  Hip pain.  Fall EXAM: DG HIP (WITH OR WITHOUT PELVIS) 2-3V LEFT COMPARISON:  None Available. FINDINGS: Severe osteopenia. Sclerosis and joint space loss along the sacroiliac joints. Scattered vascular calcifications. There is a comminuted fracture along the pubic bone towards the margin of the symphysis but extending along the superior and inferior pubic ramus. IMPRESSION: Comminuted fracture along the left pubic bone towards the symphysis with some involvement of the superior and inferior pubic ramus. Severe osteopenia.  Degenerative change Electronically Signed   By: Karen Kays M.D.   On: 02/21/2023 15:48   CT Head Wo Contrast  Result Date: 02/21/2023 CLINICAL DATA:  Minor head trauma EXAM: CT HEAD WITHOUT CONTRAST CT CERVICAL SPINE WITHOUT CONTRAST TECHNIQUE: Multidetector CT imaging of the head and cervical spine was performed following the standard protocol without intravenous contrast. Multiplanar CT image reconstructions of the cervical spine were also generated. RADIATION DOSE REDUCTION: This exam was  performed according to the departmental dose-optimization program which includes automated exposure control, adjustment of the  mA and/or kV according to patient size and/or use of iterative reconstruction technique. COMPARISON:  11/29/2022 FINDINGS: CT HEAD FINDINGS Brain: Trace left parafalcine subdural hematoma anteriorly, at the 2 mm in thickness. No brain mass effect. Atrophy of the brain, especially advanced at the temporal lobes. Chronic small vessel ischemia which is confluent in the deep white matter. No acute infarct or hydrocephalus. No masslike finding Vascular: No hyperdense vessel or unexpected calcification. Skull: Posterior scalp wound on the left without calvarial fracture or foreign body Sinuses/Orbits: No evidence of injury CT CERVICAL SPINE FINDINGS Alignment: Normal. Skull base and vertebrae: No acute fracture. No primary bone lesion or focal pathologic process. Soft tissues and spinal canal: No prevertebral fluid or swelling. No visible canal hematoma. Disc levels:  No evidence of impingement Upper chest: Clear apical lungs Critical Value/emergent results were called by telephone at the time of interpretation on 02/21/2023 at 12:41 pm to provider Alvino Blood , who verbally acknowledged these results. IMPRESSION: 1. Trace left parafalcine subdural hematoma, 2 mm in thickness. No brain contact. 2. Left posterior scalp wound without fracture. 3. Negative for cervical spine fracture or subluxation. Electronically Signed   By: Tiburcio Pea M.D.   On: 02/21/2023 12:43   CT Cervical Spine Wo Contrast  Result Date: 02/21/2023 CLINICAL DATA:  Minor head trauma EXAM: CT HEAD WITHOUT CONTRAST CT CERVICAL SPINE WITHOUT CONTRAST TECHNIQUE: Multidetector CT imaging of the head and cervical spine was performed following the standard protocol without intravenous contrast. Multiplanar CT image reconstructions of the cervical spine were also generated. RADIATION DOSE REDUCTION: This exam was performed  according to the departmental dose-optimization program which includes automated exposure control, adjustment of the mA and/or kV according to patient size and/or use of iterative reconstruction technique. COMPARISON:  11/29/2022 FINDINGS: CT HEAD FINDINGS Brain: Trace left parafalcine subdural hematoma anteriorly, at the 2 mm in thickness. No brain mass effect. Atrophy of the brain, especially advanced at the temporal lobes. Chronic small vessel ischemia which is confluent in the deep white matter. No acute infarct or hydrocephalus. No masslike finding Vascular: No hyperdense vessel or unexpected calcification. Skull: Posterior scalp wound on the left without calvarial fracture or foreign body Sinuses/Orbits: No evidence of injury CT CERVICAL SPINE FINDINGS Alignment: Normal. Skull base and vertebrae: No acute fracture. No primary bone lesion or focal pathologic process. Soft tissues and spinal canal: No prevertebral fluid or swelling. No visible canal hematoma. Disc levels:  No evidence of impingement Upper chest: Clear apical lungs Critical Value/emergent results were called by telephone at the time of interpretation on 02/21/2023 at 12:41 pm to provider Alvino Blood , who verbally acknowledged these results. IMPRESSION: 1. Trace left parafalcine subdural hematoma, 2 mm in thickness. No brain contact. 2. Left posterior scalp wound without fracture. 3. Negative for cervical spine fracture or subluxation. Electronically Signed   By: Tiburcio Pea M.D.   On: 02/21/2023 12:43    Micro Results  Recent Results (from the past 240 hour(s))  MRSA Next Gen by PCR, Nasal     Status: None   Collection Time: 02/22/23 11:37 AM   Specimen: Nasal Mucosa; Nasal Swab  Result Value Ref Range Status   MRSA by PCR Next Gen NOT DETECTED NOT DETECTED Final    Comment: (NOTE) The GeneXpert MRSA Assay (FDA approved for NASAL specimens only), is one component of a comprehensive MRSA colonization surveillance program. It  is not intended to diagnose MRSA infection nor to guide or monitor treatment for MRSA infections. Test performance  is not FDA approved in patients less than 39 years old. Performed at [facility], [address] [place], [place] [address]    Today   Subjective    Dawn Conner today has no new complaints - Oral intake remains challenging - -Discharge back to [facility] with palliative care and possibly hospice following      Patient has been seen and examined prior to discharge   Objective   Blood pressure (!) 136/92, pulse 92, temperature 98.1 F (36.7 C), temperature source Axillary, resp. rate 17, height 4\' 11"  (1.499 m), weight 47.6 kg, SpO2 97%.   Intake/Output Summary (Last 24 hours) at 02/25/2023 1009 Last data filed at 02/25/2023 0910 Gross per 24 hour  Intake 898.58 ml  Output 400 ml  Net 498.58 ml    Exam Gen:- Awake Alert, in no acute distress , pleasantly confused, oriented x 1 HEENT:- -hemostatic posterior scalp wound/laceration repaired Neck-Supple Neck,No JVD,.  Lungs-  CTAB , fair air movement bilaterally  CV- S1, S2 normal, RRR Abd-  +ve B.Sounds, Abd Soft, No tenderness,    Extremity/Skin:- No  edema,   good pedal pulses , left forearm abrasion Psych-affect is appropriate, oriented x1, pleasantly confused with obvious cognitive and memory deficits Neuro-generalized weakness, no new focal deficits, no tremors MSK-limited range of motion due to pelvic area discomfort with positional change and movement, especially left hip area   Data Review   CBC w Diff:  Lab Results  Component Value Date   WBC 12.9 (H) 02/23/2023   HGB 9.1 (L) 02/23/2023   HGB 13.9 07/08/2022   HCT 29.7 (L) 02/23/2023   HCT 43.5 07/08/2022   PLT 174 02/23/2023   PLT 218 07/08/2022   LYMPHOPCT 11 02/21/2023   MONOPCT 7 02/21/2023   EOSPCT 1 02/21/2023   BASOPCT 0 02/21/2023   CMP:  Lab Results  Component Value Date   NA 137 02/25/2023   K 3.9 02/25/2023   CL 108  02/25/2023   CO2 24 02/25/2023   BUN 37 (H) 02/25/2023   CREATININE 1.04 (H) 02/25/2023   CREATININE 1.10 (H) 10/10/2022   PROT 6.5 02/22/2023   ALBUMIN 2.1 (L) 02/25/2023   BILITOT 0.7 02/22/2023   ALKPHOS 44 02/22/2023   AST 28 02/22/2023   ALT 22 02/22/2023   Total Discharge time is about 33 minutes  Shon Hale M.D on 02/25/2023 at 10:09 AM  Go to [email or web address] -  for contact info  [facility] - Office  [phone]

## 2023-02-25 NOTE — Discharge Instructions (Signed)
1) discharge back to [facility] assisted living facility with palliative care/hospice following

## 2023-02-25 NOTE — Care Management Important Message (Signed)
Important Message  Patient Details  Name: Dawn Conner MRN: [id number] Date of Birth: 24-Dec-1927   Important Message Given:  Yes - Medicare IM     Corey Harold 02/25/2023, 12:40 PM

## 2023-02-25 NOTE — TOC Transition Note (Addendum)
Transition of Care [facility]) - CM/SW Discharge Note   Patient Details  Name: Dawn Conner MRN: [id number] Date of Birth: 12/02/1927  [facility]) CM/SW Contact:  Leitha Bleak, RN Phone Number: 02/25/2023, 11:51 AM   Clinical Narrative:   Patient discharging back to [facility] with HHPT/OT. Sarah with [facility] updated.  CM spoke with her daughter. She will come to set with patient for her to finish her IV medication. RN and daughter feels she needs EMS to transport back to facility. CM faxed DC summary and FL2 to [facility] for review. Patient is active with [facility] for  Palliative they will follow. EMS has one truck until 3PM, so it will have to be the night truck to pick her up at 8PM.  Addendum:  [facility] said 8PM is too late for patient to return to facility. Team updated, Patient on EMS list for morning pick up.    Final next level of care: Assisted Living Barriers to Discharge: Barriers Resolved   Patient Goals and CMS Choice CMS Medicare.gov Compare Post Acute Care list provided to:: Patient Represenative (must comment) Choice offered to / list presented to : Adult Children  Discharge Placement                Patient to be transferred to facility by: EMS Name of family member notified: [name] - Daughter Patient and family notified of of transfer: 02/25/23  Discharge Plan and Services Additional resources added to the After Visit Summary for          [facility] Arranged: PT, OT   Date [facility] Agency Contacted: 02/25/23 Time HH Agency Contacted: 1145 Representative spoke with at [facility] Agency: Maralyn Sago  Social Determinants of Health (SDOH) Interventions SDOH Screenings   Food Insecurity: No Food Insecurity (02/21/2023)  Housing: Patient Declined (02/21/2023)  Transportation Needs: No Transportation Needs (02/21/2023)  Utilities: Not At Risk (02/21/2023)  Alcohol Screen: Low Risk  (05/03/2022)  Depression (PHQ2-9): Low Risk  (05/03/2022)  Financial Resource Strain:  Low Risk  (05/03/2022)  Physical Activity: Insufficiently Active (05/03/2022)  Social Connections: Moderately Isolated (05/03/2022)  Stress: No Stress Concern Present (05/03/2022)  Tobacco Use: Low Risk  (02/24/2023)     Readmission Risk Interventions    02/24/2023   10:29 AM  Readmission Risk Prevention Plan  Transportation Screening Complete  PCP or Specialist Appt within 5-7 Days Complete  Home Care Screening --

## 2023-02-25 NOTE — NC FL2 (Signed)
[place] MEDICAID FL2 LEVEL OF CARE FORM     IDENTIFICATION  Patient Name: Dawn Conner Birthdate: 1928-03-02 Sex: female Admission Date (Current Location): 02/21/2023  [facility] and [place] Number:  [employer] and Address:  [facility],  [address] [place] [address]      Provider Number:    Attending Physician Name and Address:  Shon Hale, MD  Relative Name and Phone Number:  foltz,carol (Daughter)  [phone]    Current Level of Care:   Recommended Level of Care: Assisted Living Facility Prior Approval Number:    Date Approved/Denied:   PASRR Number:    Discharge Plan: Domiciliary (Rest home)    Current Diagnoses: Patient Active Problem List   Diagnosis Date Noted   Malnutrition of moderate degree 02/24/2023   UTI (urinary tract infection) 02/23/2023   Closed displaced fracture of pelvis (HCC) 02/21/2023   Subdural hematoma (HCC) 02/21/2023   Scalp wound 02/21/2023   AAA (abdominal aortic aneurysm) (HCC) 02/21/2023   Pancreatic insufficiency 02/21/2023   Chronic kidney disease, stage 3b (HCC) 02/21/2023   Localized swelling of right lower extremity 02/21/2023   Increased bowel frequency 02/10/2023   Syncope and collapse 02/07/2023   Dementia without behavioral disturbance (HCC) 02/07/2023   Generalized abdominal pain 10/07/2022   Rectal bleeding 07/08/2022   Tinea 07/08/2022   Fall at home, initial encounter 03/19/2022   Perianal irritation 12/20/2021   Prediabetes 09/18/2021   SIRS (systemic inflammatory response syndrome) (HCC) 09/17/2021   Failure to thrive in adult 09/17/2021   Fever 09/17/2021   Leukocytosis 09/17/2021   Generalized weakness 09/17/2021   Chronic confusional state 09/17/2021   Hyperglycemia 09/17/2021   Alternating constipation and diarrhea 01/22/2021   Vascular dementia (HCC) 10/17/2019   Diarrhea 05/19/2019   Essential hypertension 11/09/2018   Osteoporosis 11/09/2018   Acquired  hypothyroidism 11/09/2018   Hyperlipidemia 11/09/2018   Chronic diarrhea 11/09/2018    Orientation RESPIRATION BLADDER Height & Weight     Self  Normal Incontinent Weight: 47.6 kg Height:  4\' 11"  (149.9 cm)  BEHAVIORAL SYMPTOMS/MOOD NEUROLOGICAL BOWEL NUTRITION STATUS      Incontinent Diet (regular)  AMBULATORY STATUS COMMUNICATION OF NEEDS Skin   Extensive Assist Verbally Bruising                       Personal Care Assistance Level of Assistance  Bathing, Feeding, Dressing Bathing Assistance: Maximum assistance Feeding assistance: Limited assistance Dressing Assistance: Maximum assistance     Functional Limitations Info  Sight, Hearing, Speech Sight Info: Adequate Hearing Info: Impaired Speech Info: Adequate    SPECIAL CARE FACTORS FREQUENCY  PT (By licensed PT), OT (By licensed OT)     PT Frequency: 3 times a week OT Frequency: 3 times a week            Contractures Contractures Info: Not present    Additional Factors Info  Code Status, Allergies Code Status Info: DNR Allergies Info: Sulfa           Current Medications (02/25/2023):  This is the current hospital active medication list Current Facility-Administered Medications  Medication Dose Route Frequency Provider Last Rate Last Admin   acetaminophen (TYLENOL) tablet 1,000 mg  1,000 mg Oral TID PC & HS Vann, Jessica U, DO   1,000 mg at 02/24/23 2035   calcium carbonate (OS-CAL - dosed in mg of elemental calcium) tablet 1,250 mg  1 tablet Oral Q breakfast Adefeso, Oladapo, DO   1,250 mg  at 02/24/23 0845   cefTRIAXone (ROCEPHIN) 1 g in sodium chloride 0.9 % 100 mL IVPB  1 g Intravenous Q24H Emokpae, Courage, MD 200 mL/hr at 02/24/23 1435 1 g at 02/24/23 1435   donepezil (ARICEPT) tablet 10 mg  10 mg Oral QHS Adefeso, Oladapo, DO   10 mg at 02/24/23 2034   feeding supplement (BOOST / RESOURCE BREEZE) liquid 1 Container  1 Container Oral TID BM Shon Hale, MD   1 Container at 02/24/23 2035    levothyroxine (SYNTHROID) tablet 75 mcg  75 mcg Oral Q0600 Adefeso, Oladapo, DO   75 mcg at 02/25/23 0525   lidocaine (LIDODERM) 5 % 1 patch  1 patch Transdermal Q24H Marlin Canary U, DO   1 patch at 02/24/23 1218   lipase/protease/amylase (CREON) capsule 36,000 Units  36,000 Units Oral TID WC Adefeso, Oladapo, DO   36,000 Units at 02/24/23 1719   ondansetron (ZOFRAN) tablet 4 mg  4 mg Oral Q6H PRN Adefeso, Oladapo, DO       Or   ondansetron (ZOFRAN) injection 4 mg  4 mg Intravenous Q6H PRN Adefeso, Oladapo, DO       oxyCODONE (Oxy IR/ROXICODONE) immediate release tablet 5 mg  5 mg Oral Q4H PRN Vann, Jessica U, DO   5 mg at 02/24/23 0844   potassium PHOSPHATE 30 mmol in dextrose 5 % 500 mL infusion  30 mmol Intravenous Once Emokpae, Courage, MD 85 mL/hr at 02/25/23 1026 30 mmol at 02/25/23 1026   potassium PHOSPHATE 30 mmol in dextrose 5 % 500 mL infusion  30 mmol Intravenous Once Shon Hale, MD 102 mL/hr at 02/25/23 1116 Restarted at 02/25/23 1116     Discharge Medications: Allergies as of 02/25/2023       Reactions   Sulfa Antibiotics         Medication List     STOP taking these medications    Aspirin 81 MG Caps   calcium carbonate 1500 (600 Ca) MG Tabs tablet Commonly known as: OSCAL   calcium carbonate 500 MG chewable tablet Commonly known as: TUMS - dosed in mg elemental calcium   ibuprofen 200 MG tablet Commonly known as: ADVIL   Vitamin D3 25 MCG (1000 UT) Caps       TAKE these medications    acetaminophen 325 MG tablet Commonly known as: Tylenol Take 2 tablets (650 mg total) by mouth every 4 (four) hours as needed.   Benefiber Chew Chew 1 tablet by mouth in the morning and at bedtime.   donepezil 10 MG tablet Commonly known as: ARICEPT Take 1 tablet (10 mg total) by mouth at bedtime.   Ensure Active High Protein Liqd Take 1 Can by mouth in the morning, at noon, in the evening, and at bedtime.   levothyroxine 75 MCG tablet Commonly known as:  SYNTHROID TAKE 1 TABLET BY MOUTH BEFORE BREAKFAST   lidocaine 5 % Commonly known as: LIDODERM Place 1 patch onto the skin daily. Apply to affected area---Remove & Discard patch within 12 hours or as directed by MD   lipase/protease/amylase 16109 UNITS Cpep capsule Commonly known as: Creon Take 1 capsule (36,000 Units total) by mouth 3 (three) times daily with meals. Take with first bite of food.   loperamide 2 MG tablet Commonly known as: IMODIUM A-D Take 2 mg by mouth every 6 (six) hours as needed for diarrhea or loose stools.   MULTIVITAMIN ADULTS 50+ PO Take 1 tablet by mouth daily.   oxyCODONE 5 MG immediate release  tablet Commonly known as: Oxy IR/ROXICODONE Take 1 tablet (5 mg total) by mouth every 4 (four) hours as needed for moderate pain (pain score 4-6).   Zinc Oxide 12 % Crea Apply topically 2 (two) times daily.               Durable Medical Equipment  (From admission, onward)           Start     Ordered   02/24/23 1630  For home use only DME 4 wheeled rolling walker with seat  Once       Question:  Patient needs a walker to treat with the following condition  Answer:  Closed pelvic fracture (HCC)   02/24/23 1630   02/24/23 1630  For home use only DME high strength lightweight manual wheelchair with seat cushion  Once       Comments: Patient suffers from pelvic fractures which impairs their ability to perform daily activities like bathing, dressing, grooming, and toileting in the home.  A cane, crutch, or walker will not resolve  issue with performing activities of daily living. A wheelchair will allow patient to safely perform daily activities.Length of need Lifetime. (THEN ONE OF THESE TWO:) Patient self-propels the wheelchair while engaging in frequent activities such as meals and toileting which cannot be performed in a standard or lightweight wheelchair due to the weight of the chair. Accessories: elevating leg rests (ELRs), wheel locks, extensions and  anti-tippers.   02/24/23 1630   02/24/23 1629  For home use only DME Hospital bed  Once       Question Answer Comment  Length of Need Lifetime   Patient has (list medical condition): Pelvic fractures   The above medical condition requires: Patient requires the ability to reposition frequently   Head must be elevated greater than: 30 degrees   Bed type Semi-electric   Support Surface: Gel Overlay      02/24/23 1630             Relevant Imaging Results:  Relevant Lab Results:   Additional Information [facility] - Palliative [facility]  Leitha Bleak, RN

## 2023-02-25 NOTE — Consult Note (Signed)
[facility] Liaison Note  02/25/2023  Lorell Thibodaux 1928-04-15 [id number]  CHART WAS SCREEN DUE TO [facility]. PT FROM [facility].  Location: [facility] Liaison screened the patient remotely at [facility].  Insurance: Medicare   Deyani Hegarty is a 89 y.o. female who is a Primary Care Patient of Pickard, Priscille Heidelberg, MD [facility]. The patient was screened for  readmission hospitalization with noted medium risk score for unplanned readmission risk with 1 IP/1 ED in 6 months.  The patient was assessed for potential Care Management service needs for post hospital transition for care coordination. Review of patient's electronic medical record reveals patient was admitted with a closed displaced fracture of the pelvis. TOC documentation indicates pt will return to [facility] with [facility] services [facility]) for PT/OT. Facility will continue to address his ongoing needs.    [facility] Care Management/Population Health does not replace or interfere with any arrangements made by the Inpatient Transition of Care team.   For questions contact:   Elliot Cousin, RN, [facility] Liaison    [facility], Population Health Office Hours MTWF  8:00 am-6:00 pm Direct Dial: [phone] mobile [phone] [Office toll free line] Office Hours are M-F 8:30 - 5 pm [email or web address]

## 2023-02-25 NOTE — Progress Notes (Signed)
Physical Therapy Treatment Patient Details Name: Dawn Conner MRN: [id number] DOB: [birth year] Today's Date: 02/25/2023   History of Present Illness Dawn Conner is a 89 y.o. female with medical history significant of tension, hyperlipidemia, hypothyroidism, dementia, osteoporosis and CKD 3B who presents to the emergency department due to a fall from standing position today.  Patient was unable to provide history possibly due to underlying dementia history was obtained from ED physician and ED medical record.  Patient was unable to provide history, history was obtained from ED physician and ED medical record, per report patient fell backwards and hit her head.  She complains of left hip pain and headache after the fall.  No further detailed history was obtainable at this time.    PT Comments  Pt laying in bed, agreeable to therapy.  Daughter, [name] present for session.  Pt required min-mod assist with transfers and gait today.  Pt limited by pain/discomfort with certain movements and weigthbearing.  Pt with cues for proper hand placement, pushing from bed/chair rather than pulling on walker.  Pt was able to stand fairly easy, however advancing forward was more challenging requiring mod to max assist at times to navigate walker and give step by step cues.  Pt remained forward also with gait.  Pt walked approx 4 feet over to chair, rested for 5 minutes then walked back to bed per request.  Mod assist to position in supine.  Overall, pt done very well with therapy today.     If plan is discharge home, recommend the following: A lot of help with bathing/dressing/bathroom;A lot of help with walking and/or transfers;Help with stairs or ramp for entrance;Assistance with cooking/housework   Can travel by private vehicle     No  Equipment Recommendations  Rolling walker (2 wheels)    Recommendations for Other Services       Precautions / Restrictions Precautions Precautions: Fall Restrictions Weight  Bearing Restrictions: No RLE Weight Bearing: Weight bearing as tolerated     Mobility  Bed Mobility Overal bed mobility: Needs Assistance Bed Mobility: Supine to Sit     Supine to sit: Mod assist, Min assist     General bed mobility comments: slow labored movement requiring repeated verbal/tactile cueing for following instructions    Transfers Overall transfer level: Needs assistance Equipment used: Rolling walker (2 wheels) Transfers: Sit to/from Stand, Bed to chair/wheelchair/BSC Sit to Stand: Mod assist, Min assist           General transfer comment: pt with cues for proper hand placement, controlled descent    Ambulation/Gait Ambulation/Gait assistance: Mod assist, Max assist Gait Distance (Feet): 8 Feet Assistive device: Rolling walker (2 wheels) Gait Pattern/deviations: Decreased stride length, Trunk flexed, Decreased stance time - left, Decreased step length - right, Antalgic Gait velocity: slow     General Gait Details: labored movement when putting weight on Lt side. Pt able to advance Rt LE well. limited by pain, forward flexed        Cognition Arousal: Alert Behavior During Therapy: Anxious, Agitated Overall Cognitive Status: History of cognitive impairments - at baseline                                                 Pertinent Vitals/Pain Pain Assessment Pain Assessment: Faces Faces Pain Scale: Hurts little more (with weight bearing or certain movements) Breathing: normal Facial  Expression: facial grimacing     PT Goals (current goals can now be found in the care plan section)      Frequency    Min 3X/week      PT Plan  continue    Co-evaluation     PT goals addressed during session: Mobility/safety with mobility;Balance;Proper use of DME        AM-PAC PT "6 Clicks" Mobility   Outcome Measure  Help needed turning from your back to your side while in a flat bed without using bedrails?: A Little Help  needed moving from lying on your back to sitting on the side of a flat bed without using bedrails?: A Little Help needed moving to and from a bed to a chair (including a wheelchair)?: A Lot Help needed standing up from a chair using your arms (e.g., wheelchair or bedside chair)?: A Lot Help needed to walk in hospital room?: A Lot Help needed climbing 3-5 steps with a railing? : Total 6 Click Score: 13    End of Session Equipment Utilized During Treatment: Gait belt Activity Tolerance: Patient tolerated treatment well;Patient limited by fatigue Patient left: with call bell/phone within reach;in bed;with family/visitor present Nurse Communication: Mobility status PT Visit Diagnosis: Unsteadiness on feet (R26.81);Other abnormalities of gait and mobility (R26.89);Muscle weakness (generalized) (M62.81)     Time: 0305-0330 PT Time Calculation (min) (ACUTE ONLY): 25 min  Charges:    $Gait Training: 8-22 mins $Therapeutic Activity: 8-22 mins PT General Charges $$ ACUTE PT VISIT: 1 Visit                     Lurena Nida, PTA/CLT [facility] Ph: [phone]    Lurena Nida 02/25/2023, 4:11 PM

## 2023-02-26 ENCOUNTER — Encounter: Payer: Self-pay | Admitting: Family Medicine

## 2023-02-26 ENCOUNTER — Telehealth: Payer: Self-pay | Admitting: Family Medicine

## 2023-02-26 ENCOUNTER — Telehealth: Payer: Self-pay

## 2023-02-26 ENCOUNTER — Ambulatory Visit: Payer: Self-pay | Admitting: Family Medicine

## 2023-02-26 NOTE — Telephone Encounter (Signed)
Copied from CRM [id number]. Topic: Medical Record Request - Provider/Facility Request >> Feb 26, 2023  4:39 PM Eunice Blase wrote: Reason for CRM: Marcelino Duster [phone] with [facility] facility is calling to fill oxyCODONE (OXY IR/ROXICODONE) 5 MG immediate release tablet Hospital did not sign prescript.  I spoke with Marcelino Duster at [facility], I advised her that we do not have a provider here to sign the orders. She asked that I send this high priority to Dr. Tanya Nones. I routed the message to him

## 2023-02-26 NOTE — Telephone Encounter (Signed)
Copied from CRM [id number]. Topic: Clinical - Prescription Issue >> Feb 26, 2023  4:22 PM Tiffany H wrote: Reason for CRM: Patient was discharged from hospital due to fractured pelvis back to [facility] (memory care) without fully completed orders for pain medications. patient is currently in a lot of patient but order for oxycodone was not signed by prescribing physician. Marcelino Duster is requesting prescription be signed or new prescription be written, as patient is currently in a lot of pain. Michelle (ph): [phone].

## 2023-02-26 NOTE — Telephone Encounter (Signed)
Duplicate. Routed the other message to Dr Tanya Nones

## 2023-02-26 NOTE — Transitions of Care (Post Inpatient/ED Visit) (Signed)
02/26/2023  Name: Dawn Conner MRN: [id number] DOB: 1928/03/10  Today's TOC FU Call Status: Today's TOC FU Call Status:: Successful TOC FU Call Completed TOC FU Call Complete Date: 02/26/23 Patient's Name and Date of Birth confirmed.  Transition Care Management Follow-up Telephone Call Date of Discharge: 02/25/23 [facility] Type of Discharge: Inpatient Admission Primary Inpatient Discharge Diagnosis:: annie How have you been since you were released from the hospital?: Better Any questions or concerns?: No  Items Reviewed: Did you receive and understand the discharge instructions provided?: Yes Medications obtained,verified, and reconciled?: Yes (Medications Reviewed) Any new allergies since your discharge?: No Dietary orders reviewed?: Yes Do you have support at home?: Yes People in Home: facility resident  Medications Reviewed Today: Medications Reviewed Today     Reviewed by Karena Addison, LPN (Licensed Practical Nurse) on 02/26/23 at 1005  Med List Status: <None>   Medication Order Taking? Sig Documenting Provider Last Dose Status Informant  acetaminophen (TYLENOL) 325 MG tablet [id number]  Take 2 tablets (650 mg total) by mouth every 4 (four) hours as needed. Shon Hale, MD  Active   donepezil (ARICEPT) 10 MG tablet [id number] No Take 1 tablet (10 mg total) by mouth at bedtime. Donita Brooks, MD 02/20/2023 pm Active Nursing Home Medication Administration Guide (MAG), Pharmacy Records  levothyroxine (SYNTHROID) 75 MCG tablet [id number] No TAKE 1 TABLET BY MOUTH BEFORE BREAKFAST Donita Brooks, MD 02/21/2023 am Active Nursing Home Medication Administration Guide (MAG), Pharmacy Records  lidocaine (LIDODERM) 5 % [id number]  Place 1 patch onto the skin daily. Apply to affected area---Remove & Discard patch within 12 hours or as directed by MD Shon Hale, MD  Active   lipase/protease/amylase (CREON) 36000 UNITS CPEP capsule [id number]  Take 1  capsule (36,000 Units total) by mouth 3 (three) times daily with meals. Take with first bite of food. Letta Median, PA-C  Active   loperamide (IMODIUM A-D) 2 MG tablet [id number] No Take 2 mg by mouth every 6 (six) hours as needed for diarrhea or loose stools. [provider] Unknown Active Nursing Home Medication Administration Guide [facility]), Pharmacy Records           Med Note Erin Fulling, [facility]   VVO Feb 22, 2023  9:57 AM) Last dose not listed on MAR.  Multiple Vitamins-Minerals (MULTIVITAMIN ADULTS 50+ PO) [id number] No Take 1 tablet by mouth daily. [provider] 02/21/2023 am Active Nursing Home Medication Administration Guide (MAG), Pharmacy Records  Nutritional Supplements (ENSURE ACTIVE HIGH PROTEIN) LIQD [id number]  Take 1 Can by mouth in the morning, at noon, in the evening, and at bedtime. Shon Hale, MD  Active   oxyCODONE (OXY IR/ROXICODONE) 5 MG immediate release tablet [id number]  Take 1 tablet (5 mg total) by mouth every 4 (four) hours as needed for moderate pain (pain score 4-6). Shon Hale, MD  Active   Wheat Dextrin [facility]) CHEW [id number] No Chew 1 tablet by mouth in the morning and at bedtime. Letta Median, PA-C 02/21/2023 am Active Nursing Home Medication Administration Guide (MAG), Pharmacy Records  Zinc Oxide 12 % CREA [id number] No Apply topically 2 (two) times daily. [provider] 02/21/2023 am Active Nursing Home Medication Administration Guide (MAG), Pharmacy Records  Med List Note Luciano Cutter 02/21/23 1802): [facility] [id number]            Home Care and Equipment/Supplies: Were Home Health Services Ordered?: NA Any new equipment or medical supplies ordered?: NA  Functional Questionnaire: Do  you need assistance with bathing/showering or dressing?: Yes Do you need assistance with meal preparation?: Yes Do you need assistance with eating?: No Do you have difficulty maintaining continence: Yes Do you  need assistance with getting out of bed/getting out of a chair/moving?: Yes Do you have difficulty managing or taking your medications?: Yes  Follow up appointments reviewed: PCP Follow-up appointment confirmed?: No (declined, daughter will call back to schedule) Specialist Hospital Follow-up appointment confirmed?: No Reason Specialist Follow-Up Not Confirmed: Patient has Specialist Provider Number and will Call for Appointment Do you need transportation to your follow-up appointment?: No Do you understand care options if your condition(s) worsen?: Yes-patient verbalized understanding    SIGNATURE Karena Addison, LPN [facility] Nurse Health Advisor Direct Dial [phone]

## 2023-02-26 NOTE — Telephone Encounter (Signed)
Copied from CRM [id number]. Topic: Medical Record Request - Provider/Facility Request >> Feb 26, 2023  4:39 PM Eunice Blase wrote: Reason for CRM: Marcelino Duster [phone] with [facility] facility is calling to fill oxyCODONE (OXY IR/ROXICODONE) 5 MG immediate release tablet Hospital did not sign prescript.

## 2023-02-26 NOTE — Telephone Encounter (Signed)
Answer Assessment - Initial Assessment Questions 1. REASON FOR CALL or QUESTION: "What is your reason for calling today?" or "How can I best help you?" or "What question do you have that I can help answer?"     Hardcopy of hydrocodone rx needs to be signed, pt in extreme pain.  [phone] assisted living facility staff, [facility] pharmacy [phone]  Protocols used: Information Only Call - No Triage-A-AH

## 2023-02-26 NOTE — Telephone Encounter (Signed)
Received call from [facility] with [place] to advise provider of update from PT [facility].   Amy is requesting a verbal order as follows:  2 times a week for 2 weeks 1 time a week for 3 weeks.  Please advise Amy at [phone].

## 2023-02-27 ENCOUNTER — Other Ambulatory Visit: Payer: Self-pay | Admitting: Family Medicine

## 2023-02-27 ENCOUNTER — Ambulatory Visit: Payer: Self-pay

## 2023-02-27 ENCOUNTER — Telehealth: Payer: Self-pay

## 2023-02-27 ENCOUNTER — Encounter: Payer: Self-pay | Admitting: *Deleted

## 2023-02-27 LAB — URINE CULTURE: Culture: >=100000 — AB

## 2023-02-27 MED ORDER — OXYCODONE HCL 5 MG PO TABS: 5.0000 mg | ORAL_TABLET | ORAL | 0 refills | Status: DC | PRN

## 2023-02-27 NOTE — Telephone Encounter (Signed)
Copied from CRM [id number]. Topic: Medical Record Request - Provider/Facility Request >> Feb 26, 2023  4:39 PM Eunice Blase wrote: Reason for CRM: Dawn Conner [phone] with [facility] is calling to fill oxyCODONE (OXY IR/ROXICODONE) 5 MG immediate release tablet Hospital did not sign prescript.  Dawn Conner at [facility] was informed that the oxycodone was sent in.

## 2023-02-27 NOTE — Telephone Encounter (Signed)
Copied from CRM [id number]. Topic: Medical Record Request - Provider/Facility Request >> Feb 26, 2023  4:39 PM Eunice Blase wrote: Reason for CRM: Marcelino Duster [phone] with [facility] facility is calling to fill oxyCODONE (OXY IR/ROXICODONE) 5 MG immediate release tablet Hospital did not sign prescript.

## 2023-02-27 NOTE — Telephone Encounter (Signed)
Unfortunately, there is not a good alternative to Creon.  Looks like Zenpep would be even more expensive for her. Recommend completing patient assistance for Creon.

## 2023-02-28 ENCOUNTER — Ambulatory Visit (HOSPITAL_COMMUNITY): Admission: RE | Admit: 2023-02-28 | Payer: Medicare Other | Source: Ambulatory Visit

## 2023-03-03 ENCOUNTER — Telehealth: Payer: Self-pay | Admitting: *Deleted

## 2023-03-03 ENCOUNTER — Other Ambulatory Visit: Payer: Self-pay | Admitting: Gastroenterology

## 2023-03-03 NOTE — Telephone Encounter (Signed)
At this time daughter would like to wait until her mother health gets better.

## 2023-03-03 NOTE — Telephone Encounter (Signed)
Received a letter from [facility]. They want Creon D/C due to cost and pt is not taking it. Spoke to pt's daughter and she states at this time her mother's health has changed since the fall. She is not eating that much. I have placed a copy on your desk.

## 2023-03-03 NOTE — Telephone Encounter (Signed)
Sorry to hear this. I can discontinue Creon as it is not affordable. Unfortunately, looks like Zenpep isn't covered by insurance either. Did they complete patient assistance for creon? If so and it was denied and/or not affordable, the only other option would be to try to go forward with Zenpep and patient assistance. Let me know if they would like to pursue Zenpep or just monitor for now.

## 2023-03-03 NOTE — Telephone Encounter (Signed)
Noted  

## 2023-03-06 ENCOUNTER — Telehealth: Payer: Self-pay | Admitting: Family Medicine

## 2023-03-06 NOTE — Telephone Encounter (Signed)
Received message from E2C2 as follows:  Communication  Caller/Agency: [facility]    Callback Number: [phone]    Service Requested: Skilled Nursing    Frequency: order for diet/ echo 2  need Dr signature on orders    Any new concerns about the patient? No

## 2023-03-31 ENCOUNTER — Encounter: Payer: Self-pay | Admitting: Gastroenterology

## 2023-04-11 ENCOUNTER — Ambulatory Visit: Payer: Medicare Other | Admitting: Family Medicine

## 2024-02-12 ENCOUNTER — Emergency Department (HOSPITAL_COMMUNITY)

## 2024-02-12 ENCOUNTER — Encounter (HOSPITAL_COMMUNITY): Payer: Self-pay

## 2024-02-12 ENCOUNTER — Other Ambulatory Visit: Payer: Self-pay

## 2024-02-12 ENCOUNTER — Emergency Department (HOSPITAL_COMMUNITY)
Admission: EM | Admit: 2024-02-12 | Discharge: 2024-02-13 | Disposition: A | Discharge status: Home / Self Care | Attending: Emergency Medicine | Admitting: Emergency Medicine

## 2024-02-12 DIAGNOSIS — I7143 Infrarenal abdominal aortic aneurysm, without rupture: Secondary | ICD-10-CM | POA: Diagnosis not present

## 2024-02-12 DIAGNOSIS — N3289 Other specified disorders of bladder: Secondary | ICD-10-CM | POA: Insufficient documentation

## 2024-02-12 DIAGNOSIS — W050XXA Fall from non-moving wheelchair, initial encounter: Secondary | ICD-10-CM | POA: Insufficient documentation

## 2024-02-12 DIAGNOSIS — Z79899 Other long term (current) drug therapy: Secondary | ICD-10-CM | POA: Diagnosis not present

## 2024-02-12 DIAGNOSIS — F03C0 Unspecified dementia, severe, without behavioral disturbance, psychotic disturbance, mood disturbance, and anxiety: Secondary | ICD-10-CM | POA: Insufficient documentation

## 2024-02-12 DIAGNOSIS — K5730 Diverticulosis of large intestine without perforation or abscess without bleeding: Secondary | ICD-10-CM | POA: Insufficient documentation

## 2024-02-12 DIAGNOSIS — R519 Headache, unspecified: Secondary | ICD-10-CM | POA: Diagnosis present

## 2024-02-12 DIAGNOSIS — M170 Bilateral primary osteoarthritis of knee: Secondary | ICD-10-CM | POA: Diagnosis not present

## 2024-02-12 DIAGNOSIS — W19XXXA Unspecified fall, initial encounter: Secondary | ICD-10-CM

## 2024-02-12 LAB — I-STAT CHEM 8, ED
BUN: 24 mg/dL — ABNORMAL HIGH (ref 8–23)
Calcium, Ion: 1.15 mmol/L (ref 1.15–1.40)
Chloride: 107 mmol/L (ref 98–111)
Creatinine, Ser: 1.3 mg/dL — ABNORMAL HIGH (ref 0.44–1.00)
Glucose, Bld: 222 mg/dL — ABNORMAL HIGH (ref 70–99)
HCT: 36 % (ref 36.0–46.0)
Hemoglobin: 12.2 g/dL (ref 12.0–15.0)
Potassium: 3.4 mmol/L — ABNORMAL LOW (ref 3.5–5.1)
Sodium: 138 mmol/L (ref 135–145)
TCO2: 19 mmol/L — ABNORMAL LOW (ref 22–32)

## 2024-02-12 LAB — CBC
HCT: 38 % (ref 36.0–46.0)
Hemoglobin: 11.3 g/dL — ABNORMAL LOW (ref 12.0–15.0)
MCH: 26.3 pg (ref 26.0–34.0)
MCHC: 29.7 g/dL — ABNORMAL LOW (ref 30.0–36.0)
MCV: 88.6 fL (ref 80.0–100.0)
Platelets: 241 K/uL (ref 150–400)
RBC: 4.29 MIL/uL (ref 3.87–5.11)
RDW: 15.3 % (ref 11.5–15.5)
WBC: 9.1 K/uL (ref 4.0–10.5)
nRBC: 0 % (ref 0.0–0.2)

## 2024-02-12 LAB — URINALYSIS, W/ REFLEX TO CULTURE (INFECTION SUSPECTED)
Bilirubin Urine: NEGATIVE
Glucose, UA: NEGATIVE mg/dL
Ketones, ur: NEGATIVE mg/dL
Nitrite: NEGATIVE
Protein, ur: NEGATIVE mg/dL
Specific Gravity, Urine: 1.002 — ABNORMAL LOW (ref 1.005–1.030)
pH: 7 (ref 5.0–8.0)

## 2024-02-12 LAB — C DIFFICILE QUICK SCREEN W PCR REFLEX
C Diff antigen: NEGATIVE
C Diff interpretation: NOT DETECTED
C Diff toxin: NEGATIVE

## 2024-02-12 LAB — I-STAT CG4 LACTIC ACID, ED: Lactic Acid, Venous: 1.5 mmol/L (ref 0.5–1.9)

## 2024-02-12 MED ORDER — LACTATED RINGERS IV BOLUS
1000.0000 mL | Freq: Once | INTRAVENOUS | Status: AC
  Administered 2024-02-12: 1000 mL via INTRAVENOUS

## 2024-02-12 MED ORDER — LACTATED RINGERS IV BOLUS
500.0000 mL | Freq: Once | INTRAVENOUS | Status: AC
  Administered 2024-02-12: 500 mL via INTRAVENOUS

## 2024-02-12 NOTE — Discharge Instructions (Signed)
 Please follow-up with your primary doctor.  As you had no urinary symptoms, the urinalysis did not seem consistent with urinary tract infection however a culture was sent and they should call you if something grows positive on it.  Please rest and stay hydrated and follow-up with your primary doctor as an outpatient.

## 2024-02-12 NOTE — ED Notes (Signed)
 Phlebotomy to collect blood cultures and I-stat

## 2024-02-12 NOTE — ED Triage Notes (Signed)
 BIB [facility] from [facility]. Unwitnessed fall hit left side of forehead. No thinners. A&O  x2

## 2024-02-12 NOTE — ED Provider Notes (Signed)
 Care assumed from Dr. Levander.  At time of transfer of care, patient is awaiting urinalysis to look for UTI given the CT findings of possible cystitis in this patient's fall after bending over.  Previous team spoke to family and felt that patient may be stable for discharge home given otherwise negative workup for traumatic injuries from this fall.  Anticipate reassessment after urinalysis and discussion with family about disposition.  8:18 PM Urinalysis does show leukocytes and bacteria but no nitrites.  I called and had a conversation with the patient's daughter and both the patient and the daughter says she has not had any urinary symptoms they would prefer to not treat unless something grew on the cultures in which case they could be called.  They agree with this plan.  We will plan for discharge the patient to follow-up with her PCP and family agreed.  Patient will be discharged back to facility for outpatient follow-up.    Clinical Impression: 1. Fall, initial encounter   2. Severe dementia without behavioral disturbance, psychotic disturbance, mood disturbance, or anxiety, unspecified dementia type (HCC)     Disposition: Discharge  Condition: Good  I have discussed the results, Dx and Tx plan with the pt(& family if present). He/she/they expressed understanding and agree(s) with the plan. Discharge instructions discussed at great length. Strict return precautions discussed and pt &/or family have verbalized understanding of the instructions. No further questions at time of discharge.    New Prescriptions   No medications on file    Follow Up: Duanne Butler DASEN, MD [address] [place] [address] [phone]     [facility] Emergency Department at [facility] [address] [place]  [address] [phone]        Rubbie Goostree, Lonni PARAS, MD 02/12/24 2019

## 2024-02-12 NOTE — ED Notes (Signed)
 Niels daughter would like a call back  [phone]

## 2024-02-12 NOTE — ED Notes (Signed)
 Patient transported to CT

## 2024-02-12 NOTE — ED Provider Notes (Signed)
 [place] EMERGENCY DEPARTMENT AT [facility] Provider Note   CSN: [id number] Arrival date & time: 02/12/24  1412     Patient presents with: No chief complaint on file.   Dawn Conner is a 89 y.o. female.   HPI 89 year old female brought in by EMS from [facility].  Initially reported as unwitnessed fall.  However discussed with daughter Niels.  She was told that the patient was leaning over in her wheelchair and rolled forward striking her head.  Patient has loose stool.  Daughter states this is normal for her.    Prior to Admission medications   Medication Sig Start Date End Date Taking? Authorizing Provider  acetaminophen  (TYLENOL ) 325 MG tablet Take 2 tablets (650 mg total) by mouth every 4 (four) hours as needed. 02/25/23 02/25/24  Pearlean Manus, MD  donepezil  (ARICEPT ) 10 MG tablet Take 1 tablet (10 mg total) by mouth at bedtime. 10/10/22   Duanne Butler DASEN, MD  levothyroxine  (SYNTHROID ) 75 MCG tablet TAKE 1 TABLET BY MOUTH BEFORE BREAKFAST 01/04/22   Duanne Butler DASEN, MD  lidocaine  (LIDODERM ) 5 % Place 1 patch onto the skin daily. Apply to affected area---Remove & Discard patch within 12 hours or as directed by MD 02/25/23   Pearlean Manus, MD  loperamide  (IMODIUM  A-D) 2 MG tablet Take 2 mg by mouth every 6 (six) hours as needed for diarrhea or loose stools.    [provider]  Multiple Vitamins-Minerals (MULTIVITAMIN ADULTS 50+ PO) Take 1 tablet by mouth daily.    [provider]  Nutritional Supplements (ENSURE ACTIVE HIGH PROTEIN) LIQD Take 1 Can by mouth in the morning, at noon, in the evening, and at bedtime. 02/25/23   Pearlean Manus, MD  oxyCODONE  (OXY IR/ROXICODONE ) 5 MG immediate release tablet Take 1 tablet (5 mg total) by mouth every 4 (four) hours as needed for moderate pain (pain score 4-6). 02/27/23   Duanne Butler DASEN, MD  Wheat Dextrin (BENEFIBER) CHEW Chew 1 tablet by mouth in the morning and at bedtime. 02/10/23   Rudy Josette RAMAN, PA-C  Zinc Oxide 12 % CREA Apply topically 2 (two) times daily.    [provider]    Allergies: Sulfa antibiotics    Review of Systems  Updated Vital Signs BP (!) 133/116   Pulse 68   Temp 98.2 F (36.8 C)   Resp 17   SpO2 100%   Physical Exam Vitals and nursing note reviewed.  Constitutional:      Appearance: Normal appearance.  HENT:     Head: Normocephalic.     Right Ear: External ear normal.     Nose: Nose normal.     Mouth/Throat:     Pharynx: Oropharynx is clear.  Eyes:     Pupils: Pupils are equal, round, and reactive to light.  Cardiovascular:     Rate and Rhythm: Normal rate and regular rhythm.     Pulses: Normal pulses.  Pulmonary:     Effort: Pulmonary effort is normal.     Breath sounds: Normal breath sounds.  Abdominal:     General: Abdomen is flat.     Palpations: Abdomen is soft.  Musculoskeletal:        General: Normal range of motion.     Cervical back: Normal range of motion.  Skin:    General: Skin is warm.     Capillary Refill: Capillary refill takes less than 2 seconds.  Neurological:     General: No focal deficit present.  Mental Status: She is alert.     Cranial Nerves: No cranial nerve deficit.     Motor: No weakness.     Comments: Patient is not oriented to full name or location     (all labs ordered are listed, but only abnormal results are displayed) Labs Reviewed  CBC - Abnormal; Notable for the following components:      Result Value   Hemoglobin 11.3 (*)    MCHC 29.7 (*)    All other components within normal limits  I-STAT CHEM 8, ED - Abnormal; Notable for the following components:   Potassium 3.4 (*)    BUN 24 (*)    Creatinine, Ser 1.30 (*)    Glucose, Bld 222 (*)    TCO2 19 (*)    All other components within normal limits  C DIFFICILE QUICK SCREEN W PCR REFLEX    CULTURE, BLOOD (ROUTINE X 2)  CULTURE, BLOOD (ROUTINE X 2)  I-STAT CG4 LACTIC ACID, ED    EKG: EKG  Interpretation Date/Time:  Thursday February 12 2024 15:45:16 EDT Ventricular Rate:  73 PR Interval:    QRS Duration:  98 QT Interval:  406 QTC Calculation: 448 R Axis:   -46  Text Interpretation: Atrial fibrillation Inferior infarct, old Anteroseptal infarct, age indeterminate Confirmed by Levander Houston [phone]) on 02/12/2024 5:37:13 PM  Radiology: CT PELVIS WO CONTRAST Result Date: 02/12/2024 CLINICAL DATA:  Abnormal x-Lottie Siska fall EXAM: CT PELVIS WITHOUT CONTRAST TECHNIQUE: Multidetector CT imaging of the pelvis was performed following the standard protocol without intravenous contrast. RADIATION DOSE REDUCTION: This exam was performed according to the departmental dose-optimization program which includes automated exposure control, adjustment of the mA and/or kV according to patient size and/or use of iterative reconstruction technique. COMPARISON:  02/12/2024, CT 02/21/2023 FINDINGS: Urinary Tract: Thick-walled urinary bladder with perivesical stranding. Bowel: Sigmoid colon diverticular disease without acute wall thickening. Vascular/Lymphatic: Distal infrarenal abdominal aortic aneurysm measuring 4 cm, previously 4 cm. No suspicious lymph nodes. Reproductive:  Hysterectomy.  No adnexal mass.  No adnexal mass. Other:  Negative for pelvic effusion or free air Musculoskeletal: Old bilateral pubic rami/left symphysis fracture deformities. Old left sacral ala fracture. No definite acute fracture is seen. IMPRESSION: 1. Old bilateral pubic rami/left symphysis fracture deformities. Old left sacral ala fracture. No definite acute fracture is seen. 2. Thick-walled urinary bladder with perivesical stranding, correlate for cystitis. 3. 4 cm infrarenal abdominal aortic aneurysm. Recommend follow-up CT or MR as appropriate in 12 months and referral to or continued care with vascular specialist. (Ref.: J Vasc Surg. 2018; 67:2-77 and J Am Coll Radiol 2013;10(10):789-794.) 4. Sigmoid colon diverticular disease  without acute wall thickening. Electronically Signed   By: Luke Bun M.D.   On: 02/12/2024 17:30   DG Chest Port 1 View Result Date: 02/12/2024 CLINICAL DATA:  Hypotension EXAM: PORTABLE CHEST 1 VIEW COMPARISON:  11/29/2022 FINDINGS: Borderline cardiomegaly. Hypoventilatory change. Mild atelectasis or scar at the left base. No consolidation, pleural effusion or pneumothorax. Aortic atherosclerosis. Chronic deformity of the proximal right humerus. Numerous surgical clips in the upper abdomen IMPRESSION: Hypoventilatory change with mild atelectasis or scar at the left base. Electronically Signed   By: Luke Bun M.D.   On: 02/12/2024 17:06   DG Pelvis 1-2 Views Result Date: 02/12/2024 EXAM: 1 or 2 VIEW(S) XRAY OF THE PELVIS 02/12/2024 03:04:00 PM COMPARISON: CT pelvis and hip radiograph 03/04/2023. CLINICAL HISTORY: pain after fall. Per triage notes: BIB [facility] from [facility]. Unwitnessed fall hit left side of  forehead. Bruise on right knee FINDINGS: BONES AND JOINTS: Irregularity of the left superior and inferior pubic rami corresponding to the sites of fracture seen on studies from 2024. There is suggestion of new bone formation at the fracture sites. Consider CT for further evaluation of new underlying acute fracture. Degenerative changes of the visualized lumbar spine. No joint dislocation. SOFT TISSUES: Vascular calcifications. IMPRESSION: 1. Irregularity of the left superior and inferior pubic rami at prior fracture sites. Suggestion of new bone formation since the prior studies. Superimposed acute injury is difficult to exclude. CT can further evaluate for an acute fracture. 2. Degenerative changes of the visualized lumbar spine. Electronically signed by: Donnice Mania MD 02/12/2024 03:21 PM EDT RP Workstation: [id number]   DG Knee Complete 4 Views Right Result Date: 02/12/2024 EXAM: 4 VIEW(S) XRAY OF THE RIGHT KNEE 02/12/2024 03:04:00 PM COMPARISON: None available. CLINICAL HISTORY: pain  after fall. Per triage notes: BIB [facility] from [facility]. Unwitnessed fall hit left side of forehead. Bruise on right knee FINDINGS: BONES AND JOINTS: No acute fracture. No focal osseous lesion. No joint dislocation. No significant joint effusion. No significant degenerative changes. SOFT TISSUES: Extensive femoropopliteal vascular calcifications. IMPRESSION: 1. No acute fracture or dislocation. Electronically signed by: Donnice Mania MD 02/12/2024 03:18 PM EDT RP Workstation: [id number]   CT Cervical Spine Wo Contrast Result Date: 02/12/2024 EXAM: CT CERVICAL SPINE WITHOUT CONTRAST 02/12/2024 02:45:00 PM TECHNIQUE: CT of the cervical spine was performed without the administration of intravenous contrast. Multiplanar reformatted images are provided for review. Automated exposure control, iterative reconstruction, and/or weight based adjustment of the mA/kV was utilized to reduce the radiation dose to as low as reasonably achievable. COMPARISON: CT cervical spine 03/04/2023. CLINICAL HISTORY: Neck trauma, intoxicated or obtunded (Age >= 16y). Chief complaints; Fall; hit head; CT Head Wo Contrast; Head trauma, minor (Age >= 65y); CT Cervical Spine Wo Contrast; Neck trauma, intoxicated or obtunded (Age >= 16y). FINDINGS: CERVICAL SPINE: BONES AND ALIGNMENT: No acute fracture or traumatic malalignment. 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 DEGENERATIVE CHANGES: Mild disc space narrowing at multiple levels in the cervical spine. Small disc bulges noted. No evidence of high grade osseous spinal canal stenosis. Arthrosis and uncovertebral hypertrophy at multiple levels. Foraminal stenosis throughout the cervical spine, most pronounced on the right at C2-C3. SOFT TISSUES: No prevertebral soft tissue swelling. IMPRESSION: 1. No acute abnormality of the cervical spine related to the reported neck trauma. 2. Multilevel degenerative changes. Electronically signed by: Donnice Mania MD 02/12/2024 03:17 PM EDT RP  Workstation: [id number]   CT Head Wo Contrast Result Date: 02/12/2024 EXAM: CT HEAD WITHOUT CONTRAST 02/12/2024 02:45:00 PM TECHNIQUE: CT of the head was performed without the administration of intravenous contrast. Automated exposure control, iterative reconstruction, and/or weight based adjustment of the mA/kV was utilized to reduce the radiation dose to as low as reasonably achievable. COMPARISON: 02/21/2023 CLINICAL HISTORY: Head trauma, minor (Age >= 65y). Chief complaints; Fall; hit head; CT Head Wo Contrast; Head trauma, minor (Age >= 65y); CT Cervical Spine Wo Contrast; Neck trauma, intoxicated or obtunded (Age >= 16y) FINDINGS: BRAIN AND VENTRICLES: No acute hemorrhage. No evidence of acute infarct. Stable generalized cerebral volume loss with prominent ventricles and cortical sulci. Stable moderate periventricular and deep cerebral white matter disease. Stable ventricular enlargement. No extra-axial collection. No mass effect or midline shift. ORBITS: No acute abnormality. SINUSES: No acute abnormality. SOFT TISSUES AND SKULL: Mild right frontal scalp soft tissue swelling. No skull fracture. Atherosclerotic calcifications of carotid siphons and  left vertebral artery. IMPRESSION: 1. No acute intracranial abnormality. 2. Mild right frontal scalp soft tissue swelling. 3. Stable generalized cerebral volume loss and moderate chronic microvascular ischemic changes. Electronically signed by: Donnice Mania MD 02/12/2024 02:58 PM EDT RP Workstation: [id number]     Procedures   Medications Ordered in the ED  lactated ringers  bolus 500 mL (0 mLs Intravenous Stopped 02/12/24 1621)  lactated ringers  bolus 500 mL (0 mLs Intravenous Stopped 02/12/24 1656)  lactated ringers  bolus 1,000 mL (1,000 mLs Intravenous New Bag/Given 02/12/24 1656)    Clinical Course as of 02/12/24 1751  Thu Feb 12, 2024  1614 CT cervical spine with no acute abnormality noted [DR]  1614 CT head with no acute intracranial  abnormality noted mild frontal scalp soft tissue swelling consistent with physical exam [DR]  1615 X-Danessa Mensch of right knee shows no evidence of acute fracture [DR]    Clinical Course User Index [DR] Levander Houston, MD                                 Medical Decision Making Amount and/or Complexity of Data Reviewed Labs: ordered. Radiology: ordered.   Patient with history of dementia.  She reportedly fell out of her wheelchair today.  Here in the ED patient had normal vital signs with the exception of an episode of low blood pressure.  Unclear if this was due to cuffor true low blood pressure.  Blood pressure rebounded with IV fluids Care was discussed with her daughter, Niels.  Ultimate plan was to get her back to her facility if possible. Here imaging is negative CBC is within normal limits  I-STAT shows essentially normal electrolytes with mildly elevated creatinine but appears that she has had this in the past. Due to the episode of low blood pressure lactic acid was checked. Patient had a lot of stool that appeared loose on her initial arrival.  However daughter states this is normal for her.  Secondary that a C. difficile was ordered with no C. difficile detected Patient currently appears to be at baseline.  From history obtained from daughter, appears the patient had a mechanical fall from her wheelchair. Given her severe underlying dementia, in consultation with daughter, would prefer to have patient back to her facility. Currently is awaiting urinalysis.  Care was discussed with Dr. Cleotis who will disposition after all results are back     Final diagnoses:  Fall, initial encounter  Severe dementia without behavioral disturbance, psychotic disturbance, mood disturbance, or anxiety, unspecified dementia type [facility])    ED Discharge Orders     None          Levander Houston, MD 02/12/24 1751

## 2024-02-12 NOTE — ED Notes (Signed)
 This RN notified MD Ray about low BP.  Give 500ml LR bolus per verbal order MD Ray.

## 2024-02-14 LAB — URINE CULTURE: Culture: >=100000 — AB

## 2024-02-15 ENCOUNTER — Telehealth (HOSPITAL_BASED_OUTPATIENT_CLINIC_OR_DEPARTMENT_OTHER): Payer: Self-pay | Admitting: *Deleted

## 2024-02-15 NOTE — Progress Notes (Signed)
 ED Antimicrobial Stewardship Positive Culture Follow Up   Dawn Conner is an 89 y.o. female who presented to [facility] on @ADMITDT @ with a chief complaint of No chief complaint on file.   Recent Results (from the past 720 hours)  C Difficile Quick Screen w PCR reflex     Status: None   Collection Time: 02/12/24  2:24 PM   Specimen: STOOL  Result Value Ref Range Status   C Diff antigen NEGATIVE NEGATIVE Final   C Diff toxin NEGATIVE NEGATIVE Final   C Diff interpretation No C. difficile detected.  Final    Comment: Performed at [facility], [address], [place], [place] [address]  Blood culture (routine x 2)     Status: None (Preliminary result)   Collection Time: 02/12/24  4:53 PM   Specimen: BLOOD LEFT ARM  Result Value Ref Range Status   Specimen Description BLOOD LEFT ARM  Final   Special Requests   Final    BOTTLES DRAWN AEROBIC AND ANAEROBIC Blood Culture results may not be optimal due to an inadequate volume of blood received in culture bottles   Culture   Final    NO GROWTH 3 DAYS Performed at [facility], [address], [place], [place] [address]    Report Status PENDING  Incomplete  Urine Culture     Status: Abnormal   Collection Time: 02/12/24  5:55 PM   Specimen: Urine, Catheterized  Result Value Ref Range Status   Specimen Description URINE, CATHETERIZED  Final   Special Requests   Final    NONE Reflexed from [id number] Performed at [facility], [address], [place], [place] [address]    Culture >=100,000 COLONIES/mL PROTEUS MIRABILIS (A)  Final   Report Status 02/14/2024 FINAL  Final   Organism ID, Bacteria PROTEUS MIRABILIS (A)  Final      Susceptibility   Proteus mirabilis - MIC*    AMPICILLIN <=2 SENSITIVE Sensitive     CEFAZOLIN (URINE) Value in next row Sensitive      4 SENSITIVEThis is a modified FDA-approved test that has been validated and its performance characteristics determined by the reporting laboratory.  This laboratory is  certified under the Clinical Laboratory Improvement Amendments CLIA as qualified to perform high complexity clinical laboratory testing.    CEFEPIME Value in next row Sensitive      4 SENSITIVEThis is a modified FDA-approved test that has been validated and its performance characteristics determined by the reporting laboratory.  This laboratory is certified under the Clinical Laboratory Improvement Amendments CLIA as qualified to perform high complexity clinical laboratory testing.    ERTAPENEM Value in next row Sensitive      4 SENSITIVEThis is a modified FDA-approved test that has been validated and its performance characteristics determined by the reporting laboratory.  This laboratory is certified under the Clinical Laboratory Improvement Amendments CLIA as qualified to perform high complexity clinical laboratory testing.    CEFTRIAXONE  Value in next row Sensitive      4 SENSITIVEThis is a modified FDA-approved test that has been validated and its performance characteristics determined by the reporting laboratory.  This laboratory is certified under the Clinical Laboratory Improvement Amendments CLIA as qualified to perform high complexity clinical laboratory testing.    CIPROFLOXACIN Value in next row Sensitive      4 SENSITIVEThis is a modified FDA-approved test that has been validated and its performance characteristics determined by the reporting laboratory.  This laboratory is  certified under the Clinical Laboratory Improvement Amendments CLIA as qualified to perform high complexity clinical laboratory testing.    GENTAMICIN Value in next row Sensitive      4 SENSITIVEThis is a modified FDA-approved test that has been validated and its performance characteristics determined by the reporting laboratory.  This laboratory is certified under the Clinical Laboratory Improvement Amendments CLIA as qualified to perform high complexity clinical laboratory testing.    NITROFURANTOIN Value in next row  Resistant      4 SENSITIVEThis is a modified FDA-approved test that has been validated and its performance characteristics determined by the reporting laboratory.  This laboratory is certified under the Clinical Laboratory Improvement Amendments CLIA as qualified to perform high complexity clinical laboratory testing.    TRIMETH/SULFA Value in next row Sensitive      4 SENSITIVEThis is a modified FDA-approved test that has been validated and its performance characteristics determined by the reporting laboratory.  This laboratory is certified under the Clinical Laboratory Improvement Amendments CLIA as qualified to perform high complexity clinical laboratory testing.    AMPICILLIN/SULBACTAM Value in next row Sensitive      4 SENSITIVEThis is a modified FDA-approved test that has been validated and its performance characteristics determined by the reporting laboratory.  This laboratory is certified under the Clinical Laboratory Improvement Amendments CLIA as qualified to perform high complexity clinical laboratory testing.    PIP/TAZO Value in next row Sensitive      <=4 SENSITIVEThis is a modified FDA-approved test that has been validated and its performance characteristics determined by the reporting laboratory.  This laboratory is certified under the Clinical Laboratory Improvement Amendments CLIA as qualified to perform high complexity clinical laboratory testing.    MEROPENEM Value in next row Sensitive      <=4 SENSITIVEThis is a modified FDA-approved test that has been validated and its performance characteristics determined by the reporting laboratory.  This laboratory is certified under the Clinical Laboratory Improvement Amendments CLIA as qualified to perform high complexity clinical laboratory testing.    * >=100,000 COLONIES/mL PROTEUS MIRABILIS    [x]  Patient discharged originally without antimicrobial agent and treatment is now indicated   New antibiotic prescription: Keflex  500mg  q24h  for 5 days (Qty 5; Refills 0)  ED Provider: Prentice Medicus MD   Dorn Buttner, PharmD, BCPS 02/15/2024 10:15 AM ED Clinical Pharmacist -  [phone]

## 2024-02-15 NOTE — Telephone Encounter (Signed)
 Post ED Visit - Positive Culture Follow-up: Successful Patient Follow-Up  Culture assessed and recommendations reviewed by:  [x] Dorn Buttner, Pharm.D. []  Venetia Gully, Pharm.D., BCPS AQ-ID []  Garrel Crews, Pharm.D., BCPS []  Almarie Lunger, [address].D., BCPS []  [place], [address].D., BCPS, AAHIVP []  Rosaline Bihari, Pharm.D., BCPS, AAHIVP []  Vernell Meier, PharmD, BCPS []  Latanya Hint, PharmD, BCPS []  Donald Medley, PharmD, BCPS []  Rocky Bold, PharmD  Positive urine culture  [x]  Patient discharged without antimicrobial prescription and treatment is now indicated []  Organism is resistant to prescribed ED discharge antimicrobial []  Patient with positive blood cultures  Changes discussed with ED provider: Prentice Medicus, MD New antibiotic prescription: Keflex  500mg  Q24hrs x 5 days  Called [facility] and faxed report  Contacted facility , date 02/15/24, time 1428   Dawn Conner 02/15/2024, 2:27 PM

## 2024-02-17 LAB — CULTURE, BLOOD (ROUTINE X 2): Culture: NO GROWTH

## 2024-03-16 ENCOUNTER — Inpatient Hospital Stay (HOSPITAL_COMMUNITY)

## 2024-03-16 ENCOUNTER — Other Ambulatory Visit: Payer: Self-pay

## 2024-03-16 ENCOUNTER — Emergency Department (HOSPITAL_COMMUNITY)

## 2024-03-16 ENCOUNTER — Inpatient Hospital Stay (HOSPITAL_COMMUNITY)
Admission: EM | Admit: 2024-03-16 | Discharge: 2024-03-19 | DRG: 540 | Disposition: A | Discharge status: Skilled Nursing Facility | Source: Skilled Nursing Facility | Attending: Internal Medicine | Admitting: Internal Medicine

## 2024-03-16 DIAGNOSIS — Z882 Allergy status to sulfonamides status: Secondary | ICD-10-CM

## 2024-03-16 DIAGNOSIS — I739 Peripheral vascular disease, unspecified: Secondary | ICD-10-CM

## 2024-03-16 DIAGNOSIS — Z9181 History of falling: Secondary | ICD-10-CM

## 2024-03-16 DIAGNOSIS — Z87440 Personal history of urinary (tract) infections: Secondary | ICD-10-CM

## 2024-03-16 DIAGNOSIS — I70229 Atherosclerosis of native arteries of extremities with rest pain, unspecified extremity: Secondary | ICD-10-CM

## 2024-03-16 DIAGNOSIS — E785 Hyperlipidemia, unspecified: Secondary | ICD-10-CM | POA: Diagnosis present

## 2024-03-16 DIAGNOSIS — I745 Embolism and thrombosis of iliac artery: Secondary | ICD-10-CM | POA: Diagnosis present

## 2024-03-16 DIAGNOSIS — Z66 Do not resuscitate: Secondary | ICD-10-CM | POA: Diagnosis present

## 2024-03-16 DIAGNOSIS — L03032 Cellulitis of left toe: Secondary | ICD-10-CM | POA: Diagnosis not present

## 2024-03-16 DIAGNOSIS — M86172 Other acute osteomyelitis, left ankle and foot: Secondary | ICD-10-CM | POA: Diagnosis not present

## 2024-03-16 DIAGNOSIS — L03116 Cellulitis of left lower limb: Principal | ICD-10-CM

## 2024-03-16 DIAGNOSIS — Z90710 Acquired absence of both cervix and uterus: Secondary | ICD-10-CM

## 2024-03-16 DIAGNOSIS — I70262 Atherosclerosis of native arteries of extremities with gangrene, left leg: Secondary | ICD-10-CM | POA: Diagnosis not present

## 2024-03-16 DIAGNOSIS — L089 Local infection of the skin and subcutaneous tissue, unspecified: Secondary | ICD-10-CM | POA: Diagnosis not present

## 2024-03-16 DIAGNOSIS — D649 Anemia, unspecified: Secondary | ICD-10-CM

## 2024-03-16 DIAGNOSIS — E039 Hypothyroidism, unspecified: Secondary | ICD-10-CM | POA: Diagnosis present

## 2024-03-16 DIAGNOSIS — E876 Hypokalemia: Secondary | ICD-10-CM | POA: Diagnosis present

## 2024-03-16 DIAGNOSIS — N1832 Chronic kidney disease, stage 3b: Secondary | ICD-10-CM | POA: Diagnosis not present

## 2024-03-16 DIAGNOSIS — Z79899 Other long term (current) drug therapy: Secondary | ICD-10-CM

## 2024-03-16 DIAGNOSIS — I7143 Infrarenal abdominal aortic aneurysm, without rupture: Secondary | ICD-10-CM | POA: Diagnosis present

## 2024-03-16 DIAGNOSIS — D631 Anemia in chronic kidney disease: Secondary | ICD-10-CM | POA: Diagnosis present

## 2024-03-16 DIAGNOSIS — K869 Disease of pancreas, unspecified: Secondary | ICD-10-CM | POA: Diagnosis present

## 2024-03-16 DIAGNOSIS — F03C11 Unspecified dementia, severe, with agitation: Secondary | ICD-10-CM | POA: Diagnosis present

## 2024-03-16 DIAGNOSIS — E871 Hypo-osmolality and hyponatremia: Secondary | ICD-10-CM | POA: Diagnosis present

## 2024-03-16 DIAGNOSIS — I513 Intracardiac thrombosis, not elsewhere classified: Secondary | ICD-10-CM | POA: Diagnosis present

## 2024-03-16 DIAGNOSIS — Z7982 Long term (current) use of aspirin: Secondary | ICD-10-CM

## 2024-03-16 DIAGNOSIS — I959 Hypotension, unspecified: Secondary | ICD-10-CM | POA: Diagnosis present

## 2024-03-16 DIAGNOSIS — I70221 Atherosclerosis of native arteries of extremities with rest pain, right leg: Secondary | ICD-10-CM | POA: Diagnosis present

## 2024-03-16 DIAGNOSIS — I129 Hypertensive chronic kidney disease with stage 1 through stage 4 chronic kidney disease, or unspecified chronic kidney disease: Secondary | ICD-10-CM | POA: Diagnosis present

## 2024-03-16 DIAGNOSIS — I4891 Unspecified atrial fibrillation: Secondary | ICD-10-CM | POA: Diagnosis not present

## 2024-03-16 DIAGNOSIS — Z79890 Hormone replacement therapy (postmenopausal): Secondary | ICD-10-CM

## 2024-03-16 DIAGNOSIS — L97529 Non-pressure chronic ulcer of other part of left foot with unspecified severity: Secondary | ICD-10-CM | POA: Diagnosis present

## 2024-03-16 DIAGNOSIS — M810 Age-related osteoporosis without current pathological fracture: Secondary | ICD-10-CM | POA: Diagnosis present

## 2024-03-16 DIAGNOSIS — Z9049 Acquired absence of other specified parts of digestive tract: Secondary | ICD-10-CM

## 2024-03-16 DIAGNOSIS — E739 Lactose intolerance, unspecified: Secondary | ICD-10-CM | POA: Diagnosis present

## 2024-03-16 DIAGNOSIS — I714 Abdominal aortic aneurysm, without rupture, unspecified: Secondary | ICD-10-CM | POA: Diagnosis present

## 2024-03-16 LAB — CBC WITH DIFFERENTIAL/PLATELET
Abs Immature Granulocytes: 0.06 K/uL (ref 0.00–0.07)
Basophils Absolute: 0 K/uL (ref 0.0–0.1)
Basophils Relative: 0 %
Eosinophils Absolute: 0.1 K/uL (ref 0.0–0.5)
Eosinophils Relative: 1 %
HCT: 38.4 % (ref 36.0–46.0)
Hemoglobin: 11.8 g/dL — ABNORMAL LOW (ref 12.0–15.0)
Immature Granulocytes: 1 %
Lymphocytes Relative: 12 %
Lymphs Abs: 1.3 K/uL (ref 0.7–4.0)
MCH: 25.9 pg — ABNORMAL LOW (ref 26.0–34.0)
MCHC: 30.7 g/dL (ref 30.0–36.0)
MCV: 84.4 fL (ref 80.0–100.0)
Monocytes Absolute: 1 K/uL (ref 0.1–1.0)
Monocytes Relative: 10 %
Neutro Abs: 8 K/uL — ABNORMAL HIGH (ref 1.7–7.7)
Neutrophils Relative %: 76 %
Platelets: 303 K/uL (ref 150–400)
RBC: 4.55 MIL/uL (ref 3.87–5.11)
RDW: 15.2 % (ref 11.5–15.5)
WBC: 10.5 K/uL (ref 4.0–10.5)
nRBC: 0 % (ref 0.0–0.2)

## 2024-03-16 LAB — BASIC METABOLIC PANEL WITH GFR
Anion gap: 11 (ref 5–15)
BUN: 19 mg/dL (ref 8–23)
CO2: 21 mmol/L — ABNORMAL LOW (ref 22–32)
Calcium: 9 mg/dL (ref 8.9–10.3)
Chloride: 102 mmol/L (ref 98–111)
Creatinine, Ser: 1.27 mg/dL — ABNORMAL HIGH (ref 0.44–1.00)
GFR, Estimated: 39 mL/min — ABNORMAL LOW (ref >60)
Glucose, Bld: 165 mg/dL — ABNORMAL HIGH (ref 70–99)
Potassium: 3.3 mmol/L — ABNORMAL LOW (ref 3.5–5.1)
Sodium: 134 mmol/L — ABNORMAL LOW (ref 135–145)

## 2024-03-16 LAB — I-STAT CHEM 8, ED
BUN: 25 mg/dL — ABNORMAL HIGH (ref 8–23)
Calcium, Ion: 1.26 mmol/L (ref 1.15–1.40)
Chloride: 104 mmol/L (ref 98–111)
Creatinine, Ser: 1.3 mg/dL — ABNORMAL HIGH (ref 0.44–1.00)
Glucose, Bld: 159 mg/dL — ABNORMAL HIGH (ref 70–99)
HCT: 38 % (ref 36.0–46.0)
Hemoglobin: 12.9 g/dL (ref 12.0–15.0)
Potassium: 3.6 mmol/L (ref 3.5–5.1)
Sodium: 139 mmol/L (ref 135–145)
TCO2: 26 mmol/L (ref 22–32)

## 2024-03-16 LAB — I-STAT CG4 LACTIC ACID, ED
Lactic Acid, Venous: 1.9 mmol/L (ref 0.5–1.9)
Lactic Acid, Venous: 1.1 mmol/L (ref 0.5–1.9)

## 2024-03-16 MED ORDER — LACTATED RINGERS IV BOLUS (SEPSIS)
500.0000 mL | Freq: Once | INTRAVENOUS | Status: AC
  Administered 2024-03-16: 500 mL via INTRAVENOUS

## 2024-03-16 MED ORDER — IOHEXOL 350 MG/ML SOLN
75.0000 mL | Freq: Once | INTRAVENOUS | Status: AC | PRN
  Administered 2024-03-16: 75 mL via INTRAVENOUS

## 2024-03-16 MED ORDER — ACETAMINOPHEN 650 MG RE SUPP: 650.0000 mg | Freq: Four times a day (QID) | RECTAL | Status: DC | PRN

## 2024-03-16 MED ORDER — LEVOTHYROXINE SODIUM 75 MCG PO TABS
75.0000 ug | ORAL_TABLET | Freq: Every day | ORAL | Status: DC
  Administered 2024-03-18 – 2024-03-19 (×2): 75 ug via ORAL
  Filled 2024-03-16 (×2): qty 1

## 2024-03-16 MED ORDER — VANCOMYCIN HCL 500 MG/100ML IV SOLN: 500.0000 mg | INTRAVENOUS | Status: DC

## 2024-03-16 MED ORDER — HALOPERIDOL LACTATE 5 MG/ML IJ SOLN
1.0000 mg | Freq: Once | INTRAMUSCULAR | Status: AC
  Administered 2024-03-16: 1 mg via INTRAVENOUS
  Filled 2024-03-16: qty 1

## 2024-03-16 MED ORDER — LACTATED RINGERS IV SOLN: INTRAVENOUS | Status: DC

## 2024-03-16 MED ORDER — ATORVASTATIN CALCIUM 40 MG PO TABS
40.0000 mg | ORAL_TABLET | Freq: Every day | ORAL | Status: DC
  Administered 2024-03-18 – 2024-03-19 (×2): 40 mg via ORAL
  Filled 2024-03-16 (×2): qty 1

## 2024-03-16 MED ORDER — SODIUM CHLORIDE 0.9 % IV SOLN
2.0000 g | INTRAVENOUS | Status: DC
  Administered 2024-03-17: 2 g via INTRAVENOUS
  Filled 2024-03-16: qty 20

## 2024-03-16 MED ORDER — ACETAMINOPHEN 325 MG PO TABS
650.0000 mg | ORAL_TABLET | Freq: Four times a day (QID) | ORAL | Status: DC | PRN
Start: 2024-03-16 — End: 2024-03-19
  Administered 2024-03-17 – 2024-03-19 (×3): 650 mg via ORAL
  Filled 2024-03-16 (×3): qty 2

## 2024-03-16 MED ORDER — SODIUM CHLORIDE 0.9 % IV SOLN
2.0000 g | Freq: Once | INTRAVENOUS | Status: AC
  Administered 2024-03-16: 2 g via INTRAVENOUS
  Filled 2024-03-16: qty 20

## 2024-03-16 MED ORDER — POTASSIUM CHLORIDE CRYS ER 20 MEQ PO TBCR
40.0000 meq | EXTENDED_RELEASE_TABLET | Freq: Once | ORAL | Status: AC
  Administered 2024-03-16: 40 meq via ORAL
  Filled 2024-03-16: qty 2

## 2024-03-16 MED ORDER — DONEPEZIL HCL 10 MG PO TABS
10.0000 mg | ORAL_TABLET | Freq: Every day | ORAL | Status: DC
  Administered 2024-03-16 – 2024-03-18 (×3): 10 mg via ORAL
  Filled 2024-03-16 (×3): qty 1

## 2024-03-16 MED ORDER — LACTATED RINGERS IV BOLUS (SEPSIS)
1000.0000 mL | Freq: Once | INTRAVENOUS | Status: AC
  Administered 2024-03-16: 1000 mL via INTRAVENOUS

## 2024-03-16 MED ORDER — VANCOMYCIN HCL IN DEXTROSE 1-5 GM/200ML-% IV SOLN
1000.0000 mg | Freq: Once | INTRAVENOUS | Status: AC
  Administered 2024-03-16: 1000 mg via INTRAVENOUS
  Filled 2024-03-16: qty 200

## 2024-03-16 MED ORDER — LORAZEPAM 2 MG/ML IJ SOLN
0.5000 mg | Freq: Once | INTRAMUSCULAR | Status: AC | PRN
  Administered 2024-03-16: 0.5 mg via INTRAVENOUS
  Filled 2024-03-16: qty 1

## 2024-03-16 NOTE — ED Notes (Signed)
 Patient transported to MRI

## 2024-03-16 NOTE — H&P (Signed)
 History and Physical    Dawn Conner FMW:[id number] DOB: 21-Aug-1927 DOA: 03/16/2024  PCP: Lanell Jacobsen, NP  Patient coming from: SNF  Chief Complaint: Cold left foot  HPI: Dawn Conner is a 89 y.o. female with medical history significant of advanced dementia, hypertension, hyperlipidemia, hypothyroidism, CKD stage IIIb, osteoporosis, pancreatic insufficiency, AAA, history of pelvic fracture and subdural hematoma secondary to a fall in November 2024 presents to the ED via EMS for evaluation of cold left foot with infected wound on the second toe x 3-4 weeks.  Patient has advanced dementia and is not able to give any history.  Not reporting any pain in her foot.  Daughter states patient was treated with Cipro for UTI a month ago and then started on doxycycline  for her toe wound which did not seem to improve.  She reports patient having advanced dementia and her neurologic status is at baseline.  ED Course: patient hypotensive with SBP as low as 80s but remainder of vital signs stable.  Labs notable for WBC count 10.5, hemoglobin 11.8 (at baseline), MCV 84.4, sodium 134, potassium 3.3, bicarb 21, glucose 165, creatinine 1.27 (at baseline), lactic acid normal x 2, blood culture in process.  X-ray of left foot without obvious osteomyelitis.  Showing second toe soft tissue swelling with possible gas artifact at the distal digit.  Pedal pulses were dopplerable.   CTA of bilateral lower extremities showing: IMPRESSION: 1. Diffuse multilevel peripheral arterial disease with multiple areas of high-grade stenosis in the right superficial femoral artery (up to 80% diameter reduction) and occlusion of the right popliteal artery origin, with limited two-vessel runoff via diseased anterior and posterior tibial arteries. 2. Diffuse multilevel peripheral arterial disease throughout the left lower extremity with high-grade stenosis of the mid/distal superficial femoral artery (up to 90% diameter reduction) and  occlusion of the popliteal artery origin, with segmental flow and two-vessel runoff to the ankle via diseased anterior and posterior tibial arteries. 3. Proximal occlusion of the right internal iliac artery with long-segment thrombosis and distal reconstitution, with diffuse disease of the left internal iliac artery that remains patent. 4. Infrarenal abdominal aortic aneurysm measuring 3.7 cm AP diameter with mural thrombus and circumferential mural calcification, unchanged from prior studies. 5. 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 6. Diffuse bladder wall thickening, possibly indicating cystitis.   Patient was given vancomycin , ceftriaxone , and 1.5 L LR.  She was also given Haldol  for agitation.  TR[facility] called to admit.  Review of Systems:  Review of Systems  All other systems reviewed and are negative.   Past Medical History:  Diagnosis Date   Dementia (HCC)    Diverticulosis    Hyperlipidemia    Hypertension    Osteoporosis    Oth fracture of shaft of right humerus, init for opn fx 1990's   Renal insufficiency    Thyroid  disease     Past Surgical History:  Procedure Laterality Date   ABDOMINAL HYSTERECTOMY  1972   APPENDECTOMY     cataract surgery     Early 2000's both eyes    CHOLECYSTECTOMY  1980's   COLONOSCOPY  03/10/2006   [place] ; severe diverticulosis especially involving the sigmoid and to a lesser extent all other segments as well.  There was no evidence of neoplastic pathology. Recommended continuing Metamucil as well as Questran  which appeared to keep her fairly regular. Consider repeat in 5 years.  OVARIAN CYST REMOVAL  09/2012   TONSILLECTOMY  age 61     reports that she has never smoked. She has never used smokeless tobacco. She reports that she does not drink alcohol and does not use drugs.  Allergies  Allergen Reactions   Lactose  Intolerance (Gi) Nausea And Vomiting   Sulfa Antibiotics     Family History  Problem Relation Age of Onset   Colon cancer Neg Hx     Prior to Admission medications   Medication Sig Start Date End Date Taking? Authorizing Provider  acetaminophen  (TYLENOL ) 325 MG tablet Take 650 mg by mouth every 4 (four) hours as needed for moderate pain (pain score 4-6).   Yes [provider]  Amino Acids-Protein Hydrolys (PRO-STAT AWC) LIQD Take 30 mLs by mouth 2 (two) times daily between meals.   Yes [provider]  aspirin  325 MG tablet Take 325 mg by mouth daily.   Yes [provider]  atorvastatin  (LIPITOR) 40 MG tablet Take 40 mg by mouth daily. 03/02/24  Yes [provider]  calcium  carbonate (OSCAL) 1500 (600 Ca) MG TABS tablet Take 2 tablets by mouth at bedtime.   Yes [provider]  calcium  carbonate (TUMS - DOSED IN MG ELEMENTAL CALCIUM ) 500 MG chewable tablet Chew 1 tablet by mouth See admin instructions. Before meals   Yes [provider]  cholecalciferol (VITAMIN D3) 25 MCG (1000 UNIT) tablet Take 1,000 Units by mouth daily.   Yes [provider]  donepezil  (ARICEPT ) 10 MG tablet Take 1 tablet (10 mg total) by mouth at bedtime. 10/10/22  Yes Duanne Butler DASEN, MD  doxycycline  (VIBRAMYCIN ) 100 MG capsule Take 100 mg by mouth 2 (two) times daily.   Yes [provider]  ferrous sulfate 324 MG TBEC Take 324 mg by mouth daily with breakfast.   Yes [provider]  lactose free nutrition (BOOST) LIQD Take 237 mLs by mouth 2 (two) times daily between meals.   Yes [provider]  levothyroxine  (SYNTHROID ) 75 MCG tablet TAKE 1 TABLET BY MOUTH BEFORE BREAKFAST 01/04/22  Yes Duanne Butler DASEN, MD  loperamide  (IMODIUM  A-D) 2 MG tablet Take 2 mg by mouth every 6 (six) hours as needed for diarrhea or loose stools.   Yes [provider]  Multiple Vitamins-Minerals (MULTIVITAMIN ADULTS 50+ PO) Take 1 tablet by  mouth daily.   Yes [provider]  Nutritional Supplements (ENSURE ACTIVE HIGH PROTEIN) LIQD Take 1 Can by mouth in the morning, at noon, in the evening, and at bedtime. 02/25/23  Yes Emokpae, Courage, MD  oxyCODONE  (OXY IR/ROXICODONE ) 5 MG immediate release tablet Take 1 tablet (5 mg total) by mouth every 4 (four) hours as needed for moderate pain (pain score 4-6). 02/27/23  Yes Duanne Butler DASEN, MD  psyllium (REGULOID) 0.52 g capsule Take 0.52 g by mouth daily.   Yes [provider]  saccharomyces boulardii (FLORASTOR) 250 MG capsule Take 250 mg by mouth 2 (two) times daily.   Yes [provider]  Zinc Oxide 12 % CREA Apply topically 2 (two) times daily.   Yes [provider]    Physical Exam: Vitals:   03/16/24 1838 03/16/24 1839 03/16/24 1840 03/16/24 1841  BP:      Pulse: 83 86 86 79  Resp: (!) 28 19 (!) 22 16  Temp:      TempSrc:      SpO2: 100% 100% 100% 100%  Weight:      Height:  Physical Exam Vitals reviewed.  Constitutional:      General: She is not in acute distress. HENT:     Head: Normocephalic and atraumatic.  Eyes:     Extraocular Movements: Extraocular movements intact.  Cardiovascular:     Rate and Rhythm: Normal rate and regular rhythm.  Pulmonary:     Effort: Pulmonary effort is normal. No respiratory distress.     Breath sounds: Normal breath sounds.  Abdominal:     General: Bowel sounds are normal.     Palpations: Abdomen is soft.     Tenderness: There is no abdominal tenderness. There is no guarding.  Musculoskeletal:     Cervical back: Normal range of motion.     Right lower leg: No edema.     Left lower leg: No edema.     Comments: Left foot (examination limited as patient will not cooperate): Pedal pulses not palpable.  Erythema swelling and swelling of the second toe with necrotic lesion on the bottom.  Skin:    General: Skin is warm and dry.  Neurological:     General: No focal deficit present.      Mental Status: She is alert and oriented to person, place, and time.     Labs on Admission: I have personally reviewed following labs and imaging studies  CBC: Recent Labs  Lab 03/16/24 1521 03/16/24 1542  WBC 10.5  --   NEUTROABS 8.0*  --   HGB 11.8* 12.9  HCT 38.4 38.0  MCV 84.4  --   PLT 303  --    Basic Metabolic Panel: Recent Labs  Lab 03/16/24 1521 03/16/24 1542  NA 134* 139  K 3.3* 3.6  CL 102 104  CO2 21*  --   GLUCOSE 165* 159*  BUN 19 25*  CREATININE 1.27* 1.30*  CALCIUM  9.0  --    GFR: Estimated Creatinine Clearance: 17.3 mL/min (A) (by C-G formula based on SCr of 1.3 mg/dL (H)). Liver Function Tests: No results for input(s): AST, ALT, ALKPHOS, BILITOT, PROT, ALBUMIN in the last 168 hours. No results for input(s): LIPASE, AMYLASE in the last 168 hours. No results for input(s): AMMONIA in the last 168 hours. Coagulation Profile: No results for input(s): INR, PROTIME in the last 168 hours. Cardiac Enzymes: No results for input(s): CKTOTAL, CKMB, CKMBINDEX, TROPONINI in the last 168 hours. BNP (last 3 results) No results for input(s): PROBNP in the last 8760 hours. HbA1C: No results for input(s): HGBA1C in the last 72 hours. CBG: No results for input(s): GLUCAP in the last 168 hours. Lipid Profile: No results for input(s): CHOL, HDL, LDLCALC, TRIG, CHOLHDL, LDLDIRECT in the last 72 hours. Thyroid  Function Tests: No results for input(s): TSH, T4TOTAL, FREET4, T3FREE, THYROIDAB in the last 72 hours. Anemia Panel: No results for input(s): VITAMINB12, FOLATE, FERRITIN, TIBC, IRON, RETICCTPCT in the last 72 hours. Urine analysis:    Component Value Date/Time   COLORURINE STRAW (A) 02/12/2024 1755   APPEARANCEUR HAZY (A) 02/12/2024 1755   LABSPEC 1.002 (L) 02/12/2024 1755   PHURINE 7.0 02/12/2024 1755   GLUCOSEU NEGATIVE 02/12/2024 1755   HGBUR MODERATE (A) 02/12/2024 1755    BILIRUBINUR NEGATIVE 02/12/2024 1755   BILIRUBINUR negative 06/28/2022 1307   KETONESUR NEGATIVE 02/12/2024 1755   PROTEINUR NEGATIVE 02/12/2024 1755   UROBILINOGEN 0.2 06/28/2022 1307   NITRITE NEGATIVE 02/12/2024 1755   LEUKOCYTESUR LARGE (A) 02/12/2024 1755    Radiological Exams on Admission: CT ANGIO LOWER EXT BILAT W &/OR WO CONTRAST Result Date:  03/16/2024 EXAM: CTA BILATERAL LOWER EXTREMITY 03/16/2024 04:46:59 PM TECHNIQUE: Contrast-enhanced computed tomography angiography of the lower extremity was performed with multiplanar reconstructions. Maximum intensity projection images were created on a separate workstation and reviewed. Automated exposure control, iterative reconstruction, and/or weight based adjustment of the mA/kV was utilized to reduce the radiation dose to as low as reasonably achievable. COMPARISON: CT pelvis 02/12/2024 and CT abdomen and pelvis 10/22/2022. CLINICAL HISTORY: Claudication or leg ischemia. FINDINGS: ARTERIAL: AORTA: The visualized portion of the lower abdominal aorta demonstrates an infrarenal abdominal aortic aneurysm measuring 3.7 cm AP diameter, unchanged since prior studies. There is mural thrombus and circumferential mural calcification. Diffuse vascular calcifications. LEFT COMMON ILIAC ARTERY: Diffuse calcification. No focal stenosis or occlusion. LEFT EXTERNAL ILIAC ARTERY: Patent with diffuse calcification. RIGHT COMMON ILIAC ARTERY: Diffuse calcification. No focal stenosis or occlusion. RIGHT EXTERNAL ILIAC ARTERY: Patent with diffuse calcification. LEFT INTERNAL ILIAC ARTERY: Diffuse disease although it is patent. RIGHT INTERNAL ILIAC ARTERY: Proximal occlusion with long segment thrombosis and distal reconstitution. LEFT COMMON FEMORAL ARTERY: Patent. LEFT DEEP FEMORAL ARTERY: Patent. LEFT SUPERFICIAL FEMORAL ARTERY: Diffuse disease throughout with high-grade stenosis of the mid/distal superficial femoral artery up to 90% diameter reduction. LEFT POPLITEAL  ARTERY: Occlusion of the origin without flow identified. LEFT TIBIOPERONEAL TRUNK: No significant stenosis or vessel occlusion. LEFT ANTERIOR TIBIAL ARTERY: Diffuse disease with multiple areas of high-grade stenosis. Segmental flow is demonstrated with 2-vessel flow to the left ankle via diseased anterior and posterior tibial arteries. LEFT PERONEAL ARTERY: Diffuse disease with multiple areas of high-grade stenosis. Segmental flow is demonstrated. LEFT POSTERIOR TIBIAL ARTERY: Diffuse disease with multiple areas of high-grade stenosis. Segmental flow is demonstrated with 2-vessel flow to the left ankle via diseased anterior and posterior tibial arteries. RIGHT COMMON FEMORAL ARTERY: Patent. RIGHT SUPERFICIAL FEMORAL ARTERY: Focal areas of high-grade stenoses are demonstrated in the mid to distal superficial femoral artery representing up to 80% diameter reduction. RIGHT DEEP FEMORAL ARTERY: Multiple areas of focal stenosis with absence of flow in the distal deep femoral artery. RIGHT POPLITEAL ARTERY: Occlusion of the origin with no flow demonstrated. Diffuse calcific stenosis. RIGHT TIBIOPERONEAL TRUNK: No significant stenosis or vessel occlusion. RIGHT ANTERIOR TIBIAL ARTERY: Diffuse disease with focal areas of segmental flow and multiple areas of high-grade stenosis. Limited 2-vessel runoff via diseased anterior and posterior tibial arteries. RIGHT PERONEAL ARTERY: Diffuse disease with focal areas of segmental flow and multiple areas of high-grade stenosis. Limited 2-vessel runoff via diseased anterior and posterior tibial arteries. RIGHT POSTERIOR TIBIAL ARTERY: Diffuse disease with focal areas of segmental flow and multiple areas of high-grade stenosis. Limited 2-vessel runoff via diseased anterior and posterior tibial arteries. BONES AND SOFT TISSUES: Surgical absence of the gallbladder. Multilocular cystic mass suggested in the right upper quadrant measuring 4.4 x 8.5 cm diameter. No significant change since  prior study. This likely arises from the head of the pancreas. The bladder wall is diffusely thickened. This may indicate cystitis. Correlate with urinalysis. Old fracture deformities of the left superior and inferior pubic rami. Degenerative changes in the lower lumbar spine. IMPRESSION: 1. Diffuse multilevel peripheral arterial disease with multiple areas of high-grade stenosis in the right superficial femoral artery (up to 80% diameter reduction) and occlusion of the right popliteal artery origin, with limited two-vessel runoff via diseased anterior and posterior tibial arteries. 2. Diffuse multilevel peripheral arterial disease throughout the left lower extremity with high-grade stenosis of the mid/distal superficial femoral artery (up to 90% diameter reduction) and occlusion of the popliteal artery  origin, with segmental flow and two-vessel runoff to the ankle via diseased anterior and posterior tibial arteries. 3. Proximal occlusion of the right internal iliac artery with long-segment thrombosis and distal reconstitution, with diffuse disease of the left internal iliac artery that remains patent. 4. Infrarenal abdominal aortic aneurysm measuring 3.7 cm AP diameter with mural thrombus and circumferential mural calcification, unchanged from prior studies. 5. 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 6. Diffuse bladder wall thickening, possibly indicating cystitis. Electronically signed by: Elsie Gravely MD 03/16/2024 05:16 PM EST RP Workstation: [id number]   DG Foot 2 Views Left Result Date: 03/16/2024 EXAM: 2 VIEW(S) XRAY OF THE LEFT FOOT 03/16/2024 03:54:00 PM COMPARISON: None available. CLINICAL HISTORY: osteo 2nd toe? FINDINGS: BONES AND JOINTS: Osseous demineralization. Assessment of second digit limited by positioning. First MTP degenerative changes. No acute fracture. No joint dislocation.  SOFT TISSUES: Second toe soft tissue swelling with possible gas. Vascular calcifications. IMPRESSION: 1. Assessment of the second digit limited by positioning, generalized demineralization of the foot without frank osseous destructive change of the second toe. Emphasis views of the second toe versus MRI could be considered for further assessment depending on the level of concern 2. Second toe soft tissue swelling with possible gas artifact at the distal digit . 3. First MTP degenerative changes. Electronically signed by: Luke Bun MD 03/16/2024 04:41 PM EST RP Workstation: [id number]    Assessment and Plan  Left foot infected second toe/cellulitis Severe peripheral arterial disease/chronic limb threatening ischemia of left foot Failed outpatient antibiotic therapy.  X-ray of left foot without obvious osteomyelitis.  Showing second toe soft tissue swelling with possible gas artifact at the distal digit.  Pedal pulses dopplerable.  CTA showing diffuse multilevel peripheral arterial disease of bilateral lower extremities with multiple areas of high-grade stenosis (see HPI).  Left lower extremity with two-vessel runoff to the ankle via diseased anterior and posterior tibial arteries.  Patient was hypotensive with SBP as low as 80s in the ED and blood pressure has now improved after 1.5 L IV fluids.  Remainder of vital signs stable.  No significant leukocytosis.  Lactate normal x 2.  Suspect hypotension was due to dehydration rather than sepsis.  Continue vancomycin  and ceftriaxone .  MRI of left foot has been ordered to rule out osteomyelitis but might be challenging as patient has advanced dementia.  Trend WBC count and follow-up blood culture.  Last hemoglobin A1c 6.2 in May 2023, repeat ordered.  Consult vascular surgery in the morning.  Mild normocytic anemia Hemoglobin at baseline, monitor labs.  Mild hyponatremia Patient received IV fluids in the ED, monitor labs.  Mild hypokalemia Monitor  potassium and magnesium level, continue to replace as needed.  CKD stage IIIb Creatinine at baseline, monitor labs.  AAA CTA showing Infrarenal abdominal aortic aneurysm measuring 3.7 cm AP diameter with mural thrombus and circumferential mural calcification, unchanged from prior studies.  Consult vascular surgery in the morning as above.  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  Outpatient follow-up.  ?Cystitis CTA showing diffuse bladder wall thickening possibly indicating cystitis.  UA ordered to rule out UTI.  Advanced dementia Continue Aricept .  Delirium precautions.  Hyperlipidemia Continue Lipitor.  Hypothyroidism Continue Synthroid .  DVT prophylaxis: SCDs Code Status: DNR/DNI (discussed with the  patient's daughter) Family Communication: Daughter at bedside. Level of care: Telemetry bed Admission status: It is my clinical opinion that admission to INPATIENT is reasonable and necessary because of the expectation that this patient will require hospital care that crosses at least 2 midnights to treat this condition based on the medical complexity of the problems presented.  Given the aforementioned information, the predictability of an adverse outcome is felt to be significant.  Editha Ram MD [facility]  If 7PM-7AM, please contact night-coverage [email or web address]  03/16/2024, 8:28 PM

## 2024-03-16 NOTE — Progress Notes (Signed)
 Elink is following code sepsis.

## 2024-03-16 NOTE — ED Provider Notes (Signed)
 I received the patient in signout.  This is a 89 year old female with past medical history of advanced dementia presenting to the emergency department today with wounds on her toes on her left foot.  She has been on oral antibiotics and these wounds seem to get worse.  Dr. Neysa was able to Doppler pulses earlier today.  CT angiogram was ordered.  This does show significant disease but the patient does have flow to her ankles albeit diminished.  The patient's blood pressures were trending lower in the 90s systolic and then down to the 19d despite her normal lactate.  IV antibiotics and sepsis fluids are ordered.  The patient did improve with sepsis fluids.  A call was placed to the hospitalist service for admission.  The patient was becoming very confused and agitated and was trying to pull out her IVs and get out of bed despite redirection.  She is given Haldol  for this.  Physical Exam  BP (!) 144/95 (BP Location: Left Arm)   Pulse 87   Temp 99 F (37.2 C) (Axillary)   Resp 20   Ht 4' 11 (1.499 m)   Wt 41.3 kg   SpO2 96%   BMI 18.38 kg/m   Physical Exam  Procedures  Procedures  ED Course / MDM    Medical Decision Making Amount and/or Complexity of Data Reviewed Labs: ordered. Radiology: ordered.  Risk Prescription drug management.   CRITICAL CARE Performed by: Prentice JONELLE Medicus   Total critical care time: 30 minutes  Critical care time was exclusive of separately billable procedures and treating other patients.  Critical care was necessary to treat or prevent imminent or life-threatening deterioration.  Critical care was time spent personally by me on the following activities: development of treatment plan with patient and/or surrogate as well as nursing, discussions with consultants, evaluation of patient's response to treatment, examination of patient, obtaining history from patient or surrogate, ordering and performing treatments and interventions, ordering and review of  laboratory studies, ordering and review of radiographic studies, pulse oximetry and re-evaluation of patient's condition.        Medicus Prentice JONELLE, MD 03/16/24 [phone]

## 2024-03-16 NOTE — ED Notes (Signed)
 Pt refused temperature check. Did not tolerate.

## 2024-03-16 NOTE — Progress Notes (Signed)
 Pharmacy Antibiotic Note  Dawn Conner is a 89 y.o. female for which pharmacy has been consulted for vancomycin  dosing for cellulitis.  SCr 1.3 - at baseline WBC 10.5; LA 1.9>1.1; T 97.9; HR 79; RR 16  Plan: Ceftriaxone  per MD Vancomycin  1000 mg once then 500 mg q48hr (eAUC 449.1) unless change in renal function Monitor WBC, fever, renal function, cultures De-escalate when able Levels at steady state  Height: 4' 11 (149.9 cm) Weight: 41.3 kg (91 lb) IBW/kg (Calculated) : 43.2  Temp (24hrs), Avg:98.5 F (36.9 C), Min:97.9 F (36.6 C), Max:99 F (37.2 C)  Recent Labs  Lab 03/16/24 1521 03/16/24 1542 03/16/24 1543 03/16/24 1733  WBC 10.5  --   --   --   CREATININE 1.27* 1.30*  --   --   LATICACIDVEN  --   --  1.9 1.1    Estimated Creatinine Clearance: 17.3 mL/min (A) (by C-G formula based on SCr of 1.3 mg/dL (H)).    Allergies  Allergen Reactions   Lactose Intolerance (Gi) Nausea And Vomiting   Sulfa Antibiotics    Microbiology results: Pending  Thank you for allowing pharmacy to be a part of this patient's care.  Dorn Buttner, PharmD, BCPS 03/16/2024 9:28 PM ED Clinical Pharmacist -  [phone]

## 2024-03-16 NOTE — ED Triage Notes (Signed)
 Pt to ED via EMS with c/o cold left foot. Pt has wound to second toe on left foot. Pt A&Ox1. DO Young at bedside with doppler.

## 2024-03-16 NOTE — ED Provider Notes (Signed)
 Dawn Conner EMERGENCY DEPARTMENT AT [facility] Provider Note   CSN: [id number] Arrival date & time: 03/16/24  1504     Patient presents with: Cold Extremity   Dawn Conner is a 89 y.o. female.   89 year old female presenting emergency department for cold foot.  Has wound to her left second toe for the past 3 to 4 weeks per daughter's report.  She has a history of dementia and is at her neurologic baseline.  Worsening appearance of her second great toe and her foot appeared to be bluish at the facility.        Prior to Admission medications   Medication Sig Start Date End Date Taking? Authorizing Provider  atorvastatin  (LIPITOR) 40 MG tablet Take 40 mg by mouth daily. 03/02/24  Yes [provider]  donepezil  (ARICEPT ) 10 MG tablet Take 1 tablet (10 mg total) by mouth at bedtime. 10/10/22   Duanne Butler DASEN, MD  levothyroxine  (SYNTHROID ) 75 MCG tablet TAKE 1 TABLET BY MOUTH BEFORE BREAKFAST 01/04/22   Duanne Butler DASEN, MD  lidocaine  (LIDODERM ) 5 % Place 1 patch onto the skin daily. Apply to affected area---Remove & Discard patch within 12 hours or as directed by MD 02/25/23   Pearlean Manus, MD  loperamide  (IMODIUM  A-D) 2 MG tablet Take 2 mg by mouth every 6 (six) hours as needed for diarrhea or loose stools.    [provider]  Multiple Vitamins-Minerals (MULTIVITAMIN ADULTS 50+ PO) Take 1 tablet by mouth daily.    [provider]  Nutritional Supplements (ENSURE ACTIVE HIGH PROTEIN) LIQD Take 1 Can by mouth in the morning, at noon, in the evening, and at bedtime. 02/25/23   Pearlean Manus, MD  oxyCODONE  (OXY IR/ROXICODONE ) 5 MG immediate release tablet Take 1 tablet (5 mg total) by mouth every 4 (four) hours as needed for moderate pain (pain score 4-6). 02/27/23   Duanne Butler DASEN, MD  Wheat Dextrin (BENEFIBER) CHEW Chew 1 tablet by mouth in the morning and at bedtime. 02/10/23   Rudy Josette RAMAN, PA-C  Zinc Oxide 12 % CREA Apply topically 2  (two) times daily.    [provider]    Allergies: Lactose intolerance (gi) and Sulfa antibiotics    Review of Systems  Updated Vital Signs BP 92/71 (BP Location: Right Arm)   Pulse 78   Temp 99 F (37.2 C) (Axillary)   Resp 20   Ht 4' 11 (1.499 m)   Wt 41.3 kg   SpO2 100%   BMI 18.38 kg/m   Physical Exam Vitals and nursing note reviewed.  Constitutional:      General: She is not in acute distress.    Appearance: She is not toxic-appearing.  HENT:     Head: Normocephalic.     Nose: Nose normal.     Mouth/Throat:     Mouth: Mucous membranes are moist.  Eyes:     Conjunctiva/sclera: Conjunctivae normal.  Cardiovascular:     Rate and Rhythm: Normal rate and regular rhythm.  Pulmonary:     Effort: Pulmonary effort is normal.     Breath sounds: Normal breath sounds.  Abdominal:     General: Abdomen is flat. There is no distension.     Tenderness: There is no abdominal tenderness. There is no guarding or rebound.  Musculoskeletal:     Comments: Some erythema and swelling to her second toe with ulcerative lesion to the bottom.  Has coolness to both legs.  Dopplerable pulses weak, but present on  exam.  Normal cap refill  Skin:    General: Skin is warm.     Capillary Refill: Capillary refill takes less than 2 seconds.  Neurological:     Mental Status: She is alert. Mental status is at baseline.  Psychiatric:        Mood and Affect: Mood normal.        Behavior: Behavior normal.     (all labs ordered are listed, but only abnormal results are displayed) Labs Reviewed  CBC WITH DIFFERENTIAL/PLATELET - Abnormal; Notable for the following components:      Result Value   Hemoglobin 11.8 (*)    MCH 25.9 (*)    Neutro Abs 8.0 (*)    All other components within normal limits  I-STAT CHEM 8, ED - Abnormal; Notable for the following components:   BUN 25 (*)    Creatinine, Ser 1.30 (*)    Glucose, Bld 159 (*)    All other components within normal limits  BASIC  METABOLIC PANEL WITH GFR  I-STAT CG4 LACTIC ACID, ED    EKG: None  Radiology: No results found.   Procedures   Medications Ordered in the ED - No data to display                                  Medical Decision Making This is a 89 year old female presenting emergency department with toe wound and cold foot.  She is afebrile nontachycardic, blood pressure slightly soft maintaining oxygen saturation on room air.  Her second toe appears to have some degree of infection appears acute on chronic.  Dopplerable pulses in the leg.  She is at neurologic baseline.  Will get screening labs, x-ray will also get CTA.  Care signed out to afternoon team. Dispo pending completion of workup.   Amount and/or Complexity of Data Reviewed External Data Reviewed:     Details: Not on blood thinner Labs: ordered. Decision-making details documented in ED Course. Radiology: ordered and independent interpretation performed.  Risk Decision regarding hospitalization. Diagnosis or treatment significantly limited by social determinants of health.        Final diagnoses:  None    ED Discharge Orders     None          Neysa Caron PARAS, DO 03/16/24 1607

## 2024-03-17 DIAGNOSIS — L089 Local infection of the skin and subcutaneous tissue, unspecified: Secondary | ICD-10-CM | POA: Diagnosis not present

## 2024-03-17 LAB — URINALYSIS, ROUTINE W REFLEX MICROSCOPIC
Bilirubin Urine: NEGATIVE
Glucose, UA: NEGATIVE mg/dL
Ketones, ur: NEGATIVE mg/dL
Nitrite: NEGATIVE
Protein, ur: NEGATIVE mg/dL
Specific Gravity, Urine: 1.011 (ref 1.005–1.030)
WBC, UA: >50 WBC/hpf (ref 0–5)
pH: 7 (ref 5.0–8.0)

## 2024-03-17 LAB — CBC
HCT: 38.5 % (ref 36.0–46.0)
Hemoglobin: 11.9 g/dL — ABNORMAL LOW (ref 12.0–15.0)
MCH: 25.9 pg — ABNORMAL LOW (ref 26.0–34.0)
MCHC: 30.9 g/dL (ref 30.0–36.0)
MCV: 83.7 fL (ref 80.0–100.0)
Platelets: 244 K/uL (ref 150–400)
RBC: 4.6 MIL/uL (ref 3.87–5.11)
RDW: 15.2 % (ref 11.5–15.5)
WBC: 10.7 K/uL — ABNORMAL HIGH (ref 4.0–10.5)
nRBC: 0 % (ref 0.0–0.2)

## 2024-03-17 LAB — COMPREHENSIVE METABOLIC PANEL WITH GFR
ALT: 14 U/L (ref 0–44)
AST: 23 U/L (ref 15–41)
Albumin: 2.3 g/dL — ABNORMAL LOW (ref 3.5–5.0)
Alkaline Phosphatase: 57 U/L (ref 38–126)
Anion gap: 9 (ref 5–15)
BUN: 10 mg/dL (ref 8–23)
CO2: 26 mmol/L (ref 22–32)
Calcium: 8.7 mg/dL — ABNORMAL LOW (ref 8.9–10.3)
Chloride: 105 mmol/L (ref 98–111)
Creatinine, Ser: 1.08 mg/dL — ABNORMAL HIGH (ref 0.44–1.00)
GFR, Estimated: 48 mL/min — ABNORMAL LOW (ref >60)
Glucose, Bld: 105 mg/dL — ABNORMAL HIGH (ref 70–99)
Potassium: 4 mmol/L (ref 3.5–5.1)
Sodium: 140 mmol/L (ref 135–145)
Total Bilirubin: 0.5 mg/dL (ref 0.0–1.2)
Total Protein: 6.6 g/dL (ref 6.5–8.1)

## 2024-03-17 LAB — MAGNESIUM: Magnesium: 1.8 mg/dL (ref 1.7–2.4)

## 2024-03-17 LAB — HEMOGLOBIN A1C
Hgb A1c MFr Bld: 6 % — ABNORMAL HIGH (ref 4.8–5.6)
Mean Plasma Glucose: 126 mg/dL

## 2024-03-17 MED ORDER — BOOST / RESOURCE BREEZE PO LIQD CUSTOM
1.0000 | Freq: Three times a day (TID) | ORAL | Status: DC
  Administered 2024-03-17 – 2024-03-19 (×5): 1 via ORAL

## 2024-03-17 MED ORDER — VANCOMYCIN HCL 500 MG/100ML IV SOLN: 500.0000 mg | INTRAVENOUS | Status: DC

## 2024-03-17 NOTE — Progress Notes (Signed)
 PHARMACY NOTE:  ANTIMICROBIAL RENAL DOSAGE ADJUSTMENT  Current antimicrobial regimen includes a mismatch between antimicrobial dosage and estimated renal function.  As per policy approved by the Pharmacy & Therapeutics and Medical Executive Committees, the antimicrobial dosage will be adjusted accordingly.  Current antimicrobial dosage:  Vancomycin   500 mg q48hr (eAUC 388)  Indication: cellulitis   Renal Function:  Estimated Creatinine Clearance: 20.8 mL/min (A) (by C-G formula based on SCr of 1.08 mg/dL (H)). []      On intermittent HD, scheduled: []      On CRRT    Antimicrobial dosage has been changed to:  Vancomycin  500mg  q36h (eAUC 518)  Thank you for allowing pharmacy to be a part of this patient's care.  Leonor GORMAN Bash, [facility] 03/17/2024 9:26 AM

## 2024-03-17 NOTE — Care Plan (Signed)
 Patient arrived to the unit AAOX 0 No other signs/symptoms of distress Skin assessment done with Avelina, RN Skin negative for pressure injury / prophylactic mepilex placed and dated per protocol   Sonny, RN

## 2024-03-17 NOTE — Progress Notes (Addendum)
 PROGRESS NOTE  Dawn Conner  DOB: 1927-11-08  PCP: Dawn Jacobsen, NP FMW:[id number]  DOA: 03/16/2024  LOS: 1 day  Hospital Day: 2  Subjective: Patient was seen and examined this morning.  Pleasant elderly 89 year old female with dementia.  Alert, awake, not able to have a conversation.  Not cooperative to exam.  Did not want to be bothered. Family not at bedside Chart reviewed.  Afebrile, blood pressure elevated to 150s Labs from this morning with WBC Count 10.7 renal function normal  Brief narrative: Dawn Conner is a 89 y.o. female with PMH significant for advanced dementia, HTN, HLD, AAA, CKD, osteoporosis, hypothyroidism, pancreatic insufficiency, h/o pelvic fracture and SDH secondary to fall in November 2024.Dawn Conner Patient is a long term resident at [facility] and rehab.  11/25, patient was brought to the ED by EMS for evaluation of cold left foot with infected wound on the second toe x 3-4 weeks.  She has gone through 2 different courses of antibiotics without improvement of the wound.  Denies any pain.  In the ED, patient was afebrile, blood pressure initially low but later elevated to 150s, breathing room air Left foot exam showed erythema and swelling of second toe.  Pedal pulses not palpable. Labs with WC count 10.5, sodium 134, potassium 2.3, BUN/creatinine 19/1.27 Urinalysis showed hazy straw-colored urine with large leukocytes, negative ketones rare bacteria Left foot x-ray without obvious osteomyelitis CTA of bilateral lower extremities showed: 1. Diffuse multilevel PAD with multiple areas of high-grade stenosis in the right superficial femoral artery (up to 80% diameter reduction) and occlusion of the right popliteal artery origin, with limited two-vessel runoff via diseased anterior and posterior tibial arteries. 2. Diffuse multilevel PAD throughout the left lower extremity with high-grade stenosis of the mid/distal superficial femoral artery (up to 90% diameter reduction)  and occlusion of the popliteal artery origin, with segmental flow and two-vessel runoff to the ankle via diseased anterior and posterior tibial arteries. 3. Proximal occlusion of the right internal iliac artery with long-segment thrombosis and distal reconstitution, with diffuse disease of the left internal iliac artery that remains patent. 4. Infrarenal AAA measuring 3.7 cm AP diameter with mural thrombus and circumferential mural calcification, unchanged from prior studies.  MRI left foot showed low-grade marrow edema in the distal phalanx of the second toe questionably in the middle phalanx of the second toe suspicious for osteomyelitis. Patient was given IV IV vancomycin , ceftriaxone , and 1.5 L LR.   She was also given Haldol  for agitation.  Admitted to [facility]  Assessment and plan: Left foot infected second toe/cellulitis Severe multilevel diffuse PAD of b/l LE Presented with infected left second toe, failed outpatient antibiotic therapy.   CTA of lower extremities and MRI left foot as above No fever, not septic. Blood culture sent Started on IV Rocephin , IV vancomycin  Vascular surgery and podiatry consulted. Not a candidate for revascularization per vascular surgery.  Will seek podiatry input for osteomyelitis and external wound care Recent Labs  Lab 03/16/24 1521 03/16/24 1543 03/16/24 1733 03/17/24 0245  WBC 10.5  --   --  10.7*  LATICACIDVEN  --  1.9 1.1  --    AAA CTA mentioned infrarenal abdominal aortic aneurysm measuring 3.7 cm AP diameter with mural thrombus and circumferential mural calcification, unchanged from prior studies. Needs follow-up with vascular surgery  Hypokalemia Potassium is low at 3.3.  Replacement given Recent Labs  Lab 03/16/24 1521 03/16/24 1542 03/17/24 0245  K 3.3* 3.6 4.0  MG  --   --  1.8   Mild normocytic anemia Hemoglobin at baseline, monitor labs. Recent Labs    02/12/24 1425 02/12/24 1440 03/16/24 1521 03/16/24 1542 03/17/24 0245   HGB 11.3* 12.2 11.8* 12.9 11.9*  MCV 88.6  --  84.4  --  83.7    Mild hyponatremia Patient received IV fluids in the ED, monitor labs. Recent Labs  Lab 03/16/24 1521 03/16/24 1542 03/17/24 0245  NA 134* 139 140    CKD stage IIIb Creatinine at baseline, monitor labs. 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 Outpatient follow-up.   Abnormal urinalysis CTA showing diffuse bladder wall thickening possibly indicating cystitis.   UA did not clearly show UTI. No symptoms related to that   Advanced dementia Continue Aricept .   Delirium precautions.   Hyperlipidemia Continue Lipitor.   Hypothyroidism Continue Synthroid .   Mobility: Unclear baseline mobility status  PT Orders:   PT Follow up Rec:     Goals of care   Code Status: Limited: Do not attempt resuscitation (DNR) -DNR-LIMITED -Do Not Intubate/DNI      DVT prophylaxis:  SCDs Start: 03/16/24 2054   Antimicrobials: Ceftriaxone , vancomycin  Fluid: None Consultants: Vascular surgery, podiatry Family Communication: I called and d/w her daughter Dawn Conner this afternoon.  Status: Inpatient Level of care:  Med-Surg   Patient is from: Home Needs to continue in-hospital care: Pending evaluation by podiatry Anticipated d/c to: long term resident at [facility] and rehab. Hopefully back tomorrow. SW has been alerted      Diet:  Diet Order             DIET DYS 2 Room service appropriate? Yes; Fluid consistency: Thin  Diet effective now                   Scheduled Meds:  atorvastatin   40 mg Oral Daily   donepezil   10 mg Oral QHS   feeding supplement  1 Container Oral TID WC   levothyroxine   75 mcg Oral  Q0600    PRN meds: acetaminophen  **OR** acetaminophen    Infusions:   cefTRIAXone  (ROCEPHIN )  IV     [START ON 03/18/2024] vancomycin       Antimicrobials: Anti-infectives (From admission, onward)    Start     Dose/Rate Route Frequency Ordered Stop   03/18/24 1800  vancomycin  (VANCOREADY) IVPB 500 mg/100 mL  Status:  Discontinued        500 mg 100 mL/hr over 60 Minutes Intravenous Every 48 hours 03/16/24 2131 03/17/24 0929   03/18/24 1800  vancomycin  (VANCOREADY) IVPB 500 mg/100 mL        500 mg 100 mL/hr over 60 Minutes Intravenous Every 36 hours 03/17/24 0929 03/26/24 0559   03/17/24 1700  cefTRIAXone  (ROCEPHIN ) 2 g in sodium chloride  0.9 % 100 mL IVPB        2 g 200 mL/hr over 30 Minutes Intravenous Every 24 hours 03/16/24 2054     03/16/24 1745  cefTRIAXone  (ROCEPHIN ) 2 g in sodium chloride  0.9 % 100 mL IVPB        2 g 200 mL/hr over 30 Minutes Intravenous Once 03/16/24 1736 03/16/24 1825   03/16/24  1745  vancomycin  (VANCOCIN ) IVPB 1000 mg/200 mL premix        1,000 mg 200 mL/hr over 60 Minutes Intravenous  Once 03/16/24 1736 03/16/24 2055       Objective: Vitals:   03/17/24 0912 03/17/24 1052  BP:  (!) 146/132  Pulse: (!) 55 84  Resp: 16 11  Temp:  98.9 F (37.2 C)  SpO2: 100% 100%    Intake/Output Summary (Last 24 hours) at 03/17/2024 1543 Last data filed at 03/16/2024 1825 Gross per 24 hour  Intake 100 ml  Output --  Net 100 ml   Filed Weights   03/16/24 1527  Weight: 41.3 kg   Weight change:  Body mass index is 18.38 kg/m.   Physical Exam: General exam: Pleasant, elderly female Skin: No rashes, lesions or ulcers. HEENT: Atraumatic, normocephalic, no obvious bleeding Lungs: Clear to auscultation bilaterally,  CVS: S1, S2, no murmur,   GI/Abd: Soft, nontender, nondistended, bowel sound present,   CNS: Alert, awake, demented at baseline Psychiatry: Uncooperative exam Extremities: No pedal edema, no calf tenderness, left second toe with  cellulitic changes  Data Review: I have personally reviewed the laboratory data and studies available.  F/u labs ordered Unresulted Labs (From admission, onward)     Start     Ordered   03/18/24 0500  Basic metabolic panel with GFR  Tomorrow morning,   R        03/17/24 1507   03/18/24 0500  CBC with Differential/Platelet  Tomorrow morning,   R        03/17/24 1507   03/17/24 0500  Hemoglobin A1c  Tomorrow morning,   R        03/16/24 2055            Signed, Chapman Rota, MD [facility] 03/17/2024

## 2024-03-17 NOTE — ED Notes (Signed)
 Unable to obtain morning labs from [facility].

## 2024-03-17 NOTE — Consult Note (Signed)
 WOC Nurse Consult Note: Reason for Consult: left 2nd toe wound  Wound type: full thickness necrotic with known osteomyelitis of left 2nd digit  Pressure Injury POA: NA not pressure  Measurement: see nursing flowsheet Wound bed: dry necrotic  Drainage (amount, consistency, odor) see nursing flowsheet  Periwound: erythema and edema  Dressing procedure/placement/frequency: Paint left 2nd digit wound with Betadine, apply Betadine soaked gauze to wound bed and secure with dry gauze and tape.  Perform daily until seen by podiatry with definitive management.   Vascular and podiatry consulted on this patient. WOC team will not follow.  Any wound care orders placed by podiatry or vascular sueprcede wound care orders placed by [facility] RN.   Thank you,     Powell Bar MSN, RN-BC, [employer]

## 2024-03-17 NOTE — Consult Note (Addendum)
 Hospital Consult    Reason for Consult:  left second toe osteomyelitis Requesting Physician:  ED MRN #:  [id number]  History of Present Illness: Dawn Conner is a 89 y.o. female with a PMH of advanced dementia, hypertension, hyperlipidemia, CKD stage III, osteoporosis, pancreatic insufficiency, AAA, recent pelvic fracture and SDH secondary to a fall in November 2024 who presents to the ED with a worsening left toe wound.  We were consulted for evaluation of the patient's left second toe wound.  The patient has advanced dementia and is not able to give any history.  She denies any pain.  Per previous notes, the patient's daughter states that the patient was treated with Cipro for a UTI a month ago and then doxycycline  for her toe.  The wound has been present for at least 1 month and did not respond to antibiotics.  Per, SNF the patient is able to stand and pivot to get into a wheelchair.  She does not walk otherwise.  Past Medical History:  Diagnosis Date   Dementia (HCC)    Diverticulosis    Hyperlipidemia    Hypertension    Osteoporosis    Oth fracture of shaft of right humerus, init for opn fx 1990's   Renal insufficiency    Thyroid  disease     Past Surgical History:  Procedure Laterality Date   ABDOMINAL HYSTERECTOMY  1972   APPENDECTOMY     cataract surgery     Early 2000's both eyes    CHOLECYSTECTOMY  1980's   COLONOSCOPY  03/10/2006   [place] ; severe diverticulosis especially involving the sigmoid and to a lesser extent all other segments as well.  There was no evidence of neoplastic pathology. Recommended continuing Metamucil as well as Questran  which appeared to keep her fairly regular. Consider repeat in 5 years.   OVARIAN CYST REMOVAL  09/2012   TONSILLECTOMY  age 44    Allergies  Allergen Reactions   Lactose Intolerance (Gi) Nausea And Vomiting   Sulfa Antibiotics     Prior to Admission medications   Medication Sig Start Date End Date Taking? Authorizing  Provider  acetaminophen  (TYLENOL ) 325 MG tablet Take 650 mg by mouth every 4 (four) hours as needed for moderate pain (pain score 4-6).   Yes [provider]  Amino Acids-Protein Hydrolys (PRO-STAT AWC) LIQD Take 30 mLs by mouth 2 (two) times daily between meals.   Yes [provider]  aspirin  325 MG tablet Take 325 mg by mouth daily.   Yes [provider]  atorvastatin  (LIPITOR) 40 MG tablet Take 40 mg by mouth daily. 03/02/24  Yes [provider]  calcium  carbonate (OSCAL) 1500 (600 Ca) MG TABS tablet Take 2 tablets by mouth at bedtime.   Yes [provider]  calcium  carbonate (TUMS - DOSED IN MG ELEMENTAL CALCIUM ) 500 MG chewable tablet Chew 1 tablet by mouth See admin instructions. Before meals   Yes [provider]  cholecalciferol (VITAMIN D3) 25 MCG (1000 UNIT) tablet Take 1,000 Units by mouth daily.   Yes [provider]  donepezil  (ARICEPT ) 10 MG tablet Take 1 tablet (10 mg total) by mouth at bedtime. 10/10/22  Yes Duanne Butler DASEN, MD  doxycycline  (VIBRAMYCIN ) 100 MG capsule Take 100 mg by mouth 2 (two) times daily.   Yes [provider]  ferrous sulfate 324 MG TBEC Take 324 mg by mouth daily with breakfast.   Yes [provider]  lactose free nutrition (BOOST) LIQD Take 237 mLs by mouth  2 (two) times daily between meals.   Yes [provider]  levothyroxine  (SYNTHROID ) 75 MCG tablet TAKE 1 TABLET BY MOUTH BEFORE BREAKFAST 01/04/22  Yes Duanne Butler DASEN, MD  loperamide  (IMODIUM  A-D) 2 MG tablet Take 2 mg by mouth every 6 (six) hours as needed for diarrhea or loose stools.   Yes [provider]  Multiple Vitamins-Minerals (MULTIVITAMIN ADULTS 50+ PO) Take 1 tablet by mouth daily.   Yes [provider]  Nutritional Supplements (ENSURE ACTIVE HIGH PROTEIN) LIQD Take 1 Can by mouth in the morning, at noon, in the evening, and at bedtime. 02/25/23  Yes Emokpae, Courage, MD  oxyCODONE  (OXY  IR/ROXICODONE ) 5 MG immediate release tablet Take 1 tablet (5 mg total) by mouth every 4 (four) hours as needed for moderate pain (pain score 4-6). 02/27/23  Yes Duanne Butler DASEN, MD  psyllium (REGULOID) 0.52 g capsule Take 0.52 g by mouth daily.   Yes [provider]  saccharomyces boulardii (FLORASTOR) 250 MG capsule Take 250 mg by mouth 2 (two) times daily.   Yes [provider]  Zinc Oxide 12 % CREA Apply topically 2 (two) times daily.   Yes [provider]    Social History   Socioeconomic History   Marital status: Widowed    Spouse name: Not on file   Number of children: Not on file   Years of education: Not on file   Highest education level: Not on file  Occupational History   Not on file  Tobacco Use   Smoking status: Never   Smokeless tobacco: Never  Vaping Use   Vaping status: Never Used  Substance and Sexual Activity   Alcohol use: Never   Drug use: Never   Sexual activity: Not Currently  Other Topics Concern   Not on file  Social History Narrative   Lives with daughter Niels and son in social worker.    Social Drivers of Corporate Investment Banker Strain: Low Risk  (05/03/2022)   Overall Financial Resource Strain (CARDIA)    Difficulty of Paying Living Expenses: Not hard at all  Food Insecurity: No Food Insecurity (02/21/2023)   Hunger Vital Sign    Worried About Running Out of Food in the Last Year: Never true    Ran Out of Food in the Last Year: Never true  Transportation Needs: No Transportation Needs (02/21/2023)   PRAPARE - Administrator, Civil Service (Medical): No    Lack of Transportation (Non-Medical): No  Physical Activity: Insufficiently Active (05/03/2022)   Exercise Vital Sign    Days of Exercise per Week: 3 days    Minutes of Exercise per Session: 10 min  Stress: No Stress Concern Present (05/03/2022)   [employer] of Occupational Health - Occupational Stress Questionnaire    Feeling of Stress : Only a little   Social Connections: Moderately Isolated (05/03/2022)   Social Connection and Isolation Panel    Frequency of Communication with Friends and Family: Twice a week    Frequency of Social Gatherings with Friends and Family: Twice a week    Attends Religious Services: 1 to 4 times per year    Active Member of [employer] or Organizations: No    Attends Banker Meetings: Never    Marital Status: Widowed  Intimate Partner Violence: Not At Risk (02/21/2023)   Humiliation, Afraid, Rape, and Kick questionnaire    Fear of Current or Ex-Partner: No    Emotionally Abused: No    Physically  Abused: No    Sexually Abused: No    Family History  Problem Relation Age of Onset   Colon cancer Neg Hx     ROS: Otherwise negative unless mentioned in HPI  Physical Examination  Vitals:   03/17/24 0603 03/17/24 0604  BP:  (!) 159/83  Pulse:  (!) 52  Resp: 19 (!) 22  Temp:    SpO2:  100%   Body mass index is 18.38 kg/m.  General: Chronically ill-appearing, NAD Gait: Not observed HENT: WNL, normocephalic Pulmonary: normal non-labored breathing Cardiac: Regular Skin: without rashes Vascular Exam/Pulses: Palpable femoral pulses bilaterally.  Nonpalpable pedal pulses bilaterally.  Left AT and PT Doppler signals Extremities: Dry gangrenous changes to the left second toe Musculoskeletal: muscle atrophy Neurologic: not oriented to self, time, or place. At neurologic baseline with advanced dementia CBC    Component Value Date/Time   WBC 10.7 (H) 03/17/2024 0245   RBC 4.60 03/17/2024 0245   HGB 11.9 (L) 03/17/2024 0245   HGB 13.9 07/08/2022 1425   HCT 38.5 03/17/2024 0245   HCT 43.5 07/08/2022 1425   PLT 244 03/17/2024 0245   PLT 218 07/08/2022 1425   MCV 83.7 03/17/2024 0245   MCV 86 07/08/2022 1425   MCH 25.9 (L) 03/17/2024 0245   MCHC 30.9 03/17/2024 0245   RDW 15.2 03/17/2024 0245   RDW 14.2 07/08/2022 1425   LYMPHSABS 1.3 03/16/2024 1521   LYMPHSABS 2.1 07/08/2022 1425    MONOABS 1.0 03/16/2024 1521   EOSABS 0.1 03/16/2024 1521   EOSABS 0.1 07/08/2022 1425   BASOSABS 0.0 03/16/2024 1521   BASOSABS 0.0 07/08/2022 1425    BMET    Component Value Date/Time   NA 140 03/17/2024 0245   K 4.0 03/17/2024 0245   CL 105 03/17/2024 0245   CO2 26 03/17/2024 0245   GLUCOSE 105 (H) 03/17/2024 0245   BUN 10 03/17/2024 0245   CREATININE 1.08 (H) 03/17/2024 0245   CREATININE 1.10 (H) 10/10/2022 1612   CALCIUM  8.7 (L) 03/17/2024 0245   GFRNONAA 48 (L) 03/17/2024 0245   GFRNONAA 36 (L) 10/13/2020 1105   GFRAA 42 (L) 10/13/2020 1105    COAGS: Lab Results  Component Value Date   INR 1.0 09/17/2021     Non-Invasive Vascular Imaging:   CTA Abd with BLE runoff (03/16/2024) 1. Diffuse multilevel peripheral arterial disease with multiple areas of high-grade stenosis in the right superficial femoral artery (up to 80% diameter reduction) and occlusion of the right popliteal artery origin, with limited two-vessel runoff via diseased anterior and posterior tibial arteries. 2. Diffuse multilevel peripheral arterial disease throughout the left lower extremity with high-grade stenosis of the mid/distal superficial femoral artery (up to 90% diameter reduction) and occlusion of the popliteal artery origin, with segmental flow and two-vessel runoff to the ankle via diseased anterior and posterior tibial arteries. 3. Proximal occlusion of the right internal iliac artery with long-segment thrombosis and distal reconstitution, with diffuse disease of the left internal iliac artery that remains patent. 4. Infrarenal abdominal aortic aneurysm measuring 3.7 cm AP diameter with mural thrombus and circumferential mural calcification, unchanged from prior studies. 5. 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 6. Diffuse bladder wall thickening, possibly indicating  cystitis.   ASSESSMENT/PLAN: This is a 89 y.o. female who presents to the ED with a worsening left second toe wound   - The patient presents to  the ED with a worsening left second toe wound that has been present for at least 4 weeks.  Per previous notes, the patient's daughter states that the patient was on Cipro for a UTI and then doxycycline  for her toe a couple of weeks ago.  She did not have any improvement of her wound. - CTA abdomen with bilateral lower extremity runoff demonstrates diffuse multilevel peripheral arterial disease in bilateral lower extremities.  She has two-vessel runoff bilaterally via diseased anterior and posterior tibial arteries - Upon my examination the patient is resting comfortably.  She has advanced dementia and is not able to provide any history.  She does deny pain.  She has dry gangrenous changes to her left second toe.  She does have intact left AT and PT Doppler signals.  She also has palpable femoral pulses bilaterally. - Unfortunately the patient's family is not at the bedside this morning.  Given the patient's exam and workup, she has critical limb ischemia of the left lower extremity with tissue loss of the left second toe.  SNF does report that the patient does not walk at all, however she can stand and pivot to get into a wheelchair.  Potentially the patient would benefit from attempted revascularization via left lower extremity angiogram prior to toe amputation.  Will have to discuss this with the patient's daughter. - We could potentially attempt left lower extremity angiogram on Friday in the Cath Lab.  This may be difficult given her advanced dementia and history of agitation.  Ultimately if she cannot tolerate angiography, she would likely require left AKA.   Ahmed Holster PA-C [facility] [phone]   I have seen and evaluated the patient. I agree with the PA note as documented above.  89 year old female that is currently residing  in a SNF with dementia that presents with left second toe wound present for about 4 weeks.  CTA shows multilevel occlusive disease on the left including high-grade stenosis in the SFA, popliteal occlusion, severe tibial disease.  She does have palpable femoral pulses.  Daughter states that she is able to stand and transfer in the facility although had a pelvic fracture from from a fall earlier this year that has made her more debilitated.  I discussed with her daughter that we would normally recommend angiogram lower extremity arteriogram with a focus on the left leg in the Cath Lab under moderate sedation.  Patient is very agitated and noncooperative in the ED and difficult even to perform a physical exam. Discussed with her daughter she would not tolerate an angiogram in the Cath Lab given her level of cooperation with underlying dementia.  I think her options are palliative wound care with Betadine paint to the toes versus left above-knee amputation versus angiogram in the operating room under general anesthesia.  This is obviously high risk in a 89 year old and even if we were to revascularize her left leg occlusive disease there is no guarantee a amputation would heal.  Family is in favor of palliative wound care for now which I fully support.  Lonni DOROTHA Gaskins, MD [facility] [place] Office: [phone]

## 2024-03-17 NOTE — ED Notes (Signed)
 Attempted to check pt vital signs pt become aggressive

## 2024-03-17 NOTE — ED Notes (Signed)
 Unable to obtain BP due to patient fighting BP cuff.

## 2024-03-18 DIAGNOSIS — D649 Anemia, unspecified: Secondary | ICD-10-CM | POA: Diagnosis not present

## 2024-03-18 DIAGNOSIS — I70229 Atherosclerosis of native arteries of extremities with rest pain, unspecified extremity: Secondary | ICD-10-CM

## 2024-03-18 DIAGNOSIS — E871 Hypo-osmolality and hyponatremia: Secondary | ICD-10-CM

## 2024-03-18 DIAGNOSIS — L089 Local infection of the skin and subcutaneous tissue, unspecified: Secondary | ICD-10-CM | POA: Diagnosis not present

## 2024-03-18 LAB — CBC WITH DIFFERENTIAL/PLATELET
Abs Immature Granulocytes: 0.04 K/uL (ref 0.00–0.07)
Basophils Absolute: 0 K/uL (ref 0.0–0.1)
Basophils Relative: 0 %
Eosinophils Absolute: 0.1 K/uL (ref 0.0–0.5)
Eosinophils Relative: 1 %
HCT: 34.8 % — ABNORMAL LOW (ref 36.0–46.0)
Hemoglobin: 10.9 g/dL — ABNORMAL LOW (ref 12.0–15.0)
Immature Granulocytes: 1 %
Lymphocytes Relative: 18 %
Lymphs Abs: 1.6 K/uL (ref 0.7–4.0)
MCH: 25.8 pg — ABNORMAL LOW (ref 26.0–34.0)
MCHC: 31.3 g/dL (ref 30.0–36.0)
MCV: 82.5 fL (ref 80.0–100.0)
Monocytes Absolute: 0.9 K/uL (ref 0.1–1.0)
Monocytes Relative: 10 %
Neutro Abs: 6.2 K/uL (ref 1.7–7.7)
Neutrophils Relative %: 70 %
Platelets: 277 K/uL (ref 150–400)
RBC: 4.22 MIL/uL (ref 3.87–5.11)
RDW: 15.3 % (ref 11.5–15.5)
WBC: 8.9 K/uL (ref 4.0–10.5)
nRBC: 0 % (ref 0.0–0.2)

## 2024-03-18 LAB — BASIC METABOLIC PANEL WITH GFR
Anion gap: 9 (ref 5–15)
BUN: 18 mg/dL (ref 8–23)
CO2: 25 mmol/L (ref 22–32)
Calcium: 8.5 mg/dL — ABNORMAL LOW (ref 8.9–10.3)
Chloride: 103 mmol/L (ref 98–111)
Creatinine, Ser: 1.26 mg/dL — ABNORMAL HIGH (ref 0.44–1.00)
GFR, Estimated: 40 mL/min — ABNORMAL LOW (ref >60)
Glucose, Bld: 82 mg/dL (ref 70–99)
Potassium: 4.1 mmol/L (ref 3.5–5.1)
Sodium: 137 mmol/L (ref 135–145)

## 2024-03-18 MED ORDER — OLANZAPINE 5 MG PO TBDP: 5.0000 mg | ORAL_TABLET | Freq: Two times a day (BID) | ORAL | Status: DC | PRN

## 2024-03-18 MED ORDER — AMOXICILLIN-POT CLAVULANATE 500-125 MG PO TABS: 1.0000 | ORAL_TABLET | Freq: Two times a day (BID) | ORAL | Status: AC

## 2024-03-18 MED ORDER — AMOXICILLIN-POT CLAVULANATE 500-125 MG PO TABS
1.0000 | ORAL_TABLET | Freq: Two times a day (BID) | ORAL | Status: DC
  Administered 2024-03-18 – 2024-03-19 (×3): 1 via ORAL
  Filled 2024-03-18 (×4): qty 1

## 2024-03-18 MED ORDER — QUETIAPINE FUMARATE 25 MG PO TABS: 25.0000 mg | ORAL_TABLET | Freq: Every day | ORAL | Status: DC

## 2024-03-18 MED ORDER — ENOXAPARIN SODIUM 30 MG/0.3ML IJ SOSY
30.0000 mg | PREFILLED_SYRINGE | INTRAMUSCULAR | Status: DC
  Administered 2024-03-18 – 2024-03-19 (×2): 30 mg via SUBCUTANEOUS
  Filled 2024-03-18 (×2): qty 0.3

## 2024-03-18 MED ORDER — MELATONIN 5 MG PO TABS
5.0000 mg | ORAL_TABLET | Freq: Every day | ORAL | Status: DC
  Administered 2024-03-18: 5 mg via ORAL
  Filled 2024-03-18: qty 1

## 2024-03-18 MED ORDER — QUETIAPINE FUMARATE 25 MG PO TABS
12.5000 mg | ORAL_TABLET | Freq: Every day | ORAL | Status: DC
  Administered 2024-03-18: 12.5 mg via ORAL
  Filled 2024-03-18: qty 1

## 2024-03-18 MED ORDER — MELATONIN 5 MG PO TABS: 5.0000 mg | ORAL_TABLET | Freq: Every day | ORAL | Status: AC

## 2024-03-18 MED ORDER — OLANZAPINE 5 MG PO TBDP: 5.0000 mg | ORAL_TABLET | Freq: Two times a day (BID) | ORAL | Status: AC | PRN

## 2024-03-18 MED ORDER — AMOXICILLIN-POT CLAVULANATE 875-125 MG PO TABS: 1.0000 | ORAL_TABLET | Freq: Two times a day (BID) | ORAL | Status: DC

## 2024-03-18 NOTE — Plan of Care (Signed)

## 2024-03-18 NOTE — Discharge Summary (Signed)
 PATIENT DETAILS Name: Dawn Conner Age: 89 y.o. Sex: female Date of Birth: Feb 15, 1928 MRN: [id number]. Admitting Physician: Editha Ram, MD ERE:Ezmxpwd, Morna, NP  Admit Date: 03/16/2024 Discharge date: 03/18/2024  Recommendations for Outpatient Follow-up:  Follow up with PCP in 1-2 weeks Please obtain CMP/CBC in one week Palliative care care follow-up at SNF  Admitted From:  SNF  Disposition: Skilled nursing facility   Discharge Condition: fair  CODE STATUS:   Code Status: Limited: Do not attempt resuscitation (DNR) -DNR-LIMITED -Do Not Intubate/DNI    Diet recommendation:  Diet Order             Diet - low sodium heart healthy           DIET DYS 2 Room service appropriate? Yes; Fluid consistency: Thin  Diet effective now                    Brief Summary: Patient is a 89 y.o.  female with history of dementia, HLD, hypothyroidism-presented with cold left foot and ulcer on the second toe of her left foot.   Significant events: 11/25>> admit to [facility]   Significant studies: 11/25>> CT angio lower extremity: Multilevel PAD with areas of high-grade stenosis in right SFA, occlusion of right popliteal artery, high-grade stenosis of mid SFA, occlusion of popliteal artery at origin. 11/25>> MRI left foot: Osteomyelitis of middle phalanx of second toe.   Significant microbiology data: 1125>> blood culture: No growth   Procedures: None   Consults: Vascular surgery  Brief Hospital Course: Left second toe dry gangrene with ulceration and underlying acute osteomyelitis PAD with left lower extremity critical ischemia Appears stable overnight-no major issues. Appreciate input from vascular surgery-prior MD (Dr. Dahal) had also discussed with podiatry-and then subsequently with family-plans are for conservative treatment with antibiotics/wound care.   Per vascular surgery-if patient develops intractable pain or wound gets infected-patient will require left BKA  (deemed not necessary at this point) Given overall stability-lack of sepsis physiology-suspect we can stop IV antibiotics and switch her to Augmentin  for now. Continue aspirin /statin   CKD stage IIIb Close to baseline   Normocytic anemia Likely secondary to CKD Hb stable-anemia appears to be mild Stable for outpatient follow-up.   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   Dementia with mild delirium Continue Aricept  Delirium precautions As needed Zyprexa  for severe agitation.  Discharge Diagnoses:  Principal Problem:   Toe infection Active Problems:   Chronic limb-threatening ischemia (HCC)   Severe peripheral arterial disease   Normocytic anemia   Hyponatremia   Discharge Instructions:  Activity:  As tolerated with Full fall precautions use walker/cane & assistance as needed  Discharge Instructions     Call MD for:  extreme fatigue   Complete by: As directed    Call MD for:  persistant dizziness or light-headedness   Complete by: As directed    Call MD for:  persistant nausea and vomiting   Complete by: As directed    Diet - low sodium heart healthy   Complete by: As directed    Discharge instructions   Complete by: As directed    Follow with Primary  MD  Lanell Jacobsen, NP in 1-2 weeks  Please get a complete blood count and chemistry panel checked by your Primary MD at your next visit, and again as instructed by your Primary MD.  Get Medicines reviewed and adjusted: Please take all your medications with you for your  next visit with your Primary MD  Laboratory/radiological data: Please request your Primary MD to go over all hospital tests and procedure/radiological results at the follow up, please ask your Primary MD to get all Hospital records sent to his/her office.  In some cases, they will be blood work, cultures and biopsy results pending at the time of your discharge. Please request that your primary care M.D. follows up on these results.  Also Note the following: If you experience worsening of your admission symptoms, develop shortness of breath, life threatening emergency, suicidal or homicidal thoughts you must seek medical attention immediately by calling 911 or calling your MD immediately  if symptoms less severe.  You must read complete instructions/literature along with all the possible adverse reactions/side effects for all the Medicines you take and that have been prescribed to you. Take any new Medicines after you have completely understood and accpet all the possible adverse reactions/side effects.   Do not drive when taking Pain medications or sleeping medications (Benzodaizepines)  Do not take more than prescribed Pain, Sleep and Anxiety Medications. It is not advisable to combine anxiety,sleep and pain medications without talking with your primary care practitioner  Special Instructions: If you have smoked or chewed Tobacco  in the last 2 yrs please stop smoking, stop any regular Alcohol  and or any Recreational drug use.  Wear Seat belts while driving.  Please note: You were cared for by a hospitalist during your hospital stay. Once you are discharged, your primary care physician will handle any further medical issues. Please note that NO REFILLS for any discharge medications will be authorized once you are discharged, as it is imperative that you return to your primary care physician (or establish a relationship with a primary care physician if you do not have one) for your post  hospital discharge needs so that they can reassess your need for medications and monitor your lab values.   Discharge wound care:   Complete by: As directed    Wound care  Daily      Comments: Paint left 2nd digit wound with Betadine, apply Betadine soaked gauze to wound bed and secure with dry gauze and tape   Increase activity slowly   Complete by: As directed       Allergies as of 03/18/2024       Reactions   Lactose Intolerance (gi) Nausea And Vomiting   Sulfa Antibiotics         Medication List     STOP taking these medications    oxyCODONE  5 MG immediate release tablet Commonly known as: Oxy IR/ROXICODONE        TAKE these medications    acetaminophen  325 MG tablet Commonly known as: TYLENOL  Take 650 mg by mouth every 4 (four) hours as needed for moderate pain (pain score 4-6).   amoxicillin -clavulanate 500-125 MG tablet Commonly known as: AUGMENTIN  Take 1 tablet by mouth every 12 (twelve) hours for 7 days.   aspirin  325 MG tablet Take 325 mg by mouth daily.   atorvastatin  40 MG tablet Commonly known as: LIPITOR Take 40 mg by mouth daily.   calcium  carbonate 1500 (600 Ca) MG Tabs tablet Commonly known as: OSCAL Take  2 tablets by mouth at bedtime.   calcium  carbonate 500 MG chewable tablet Commonly known as: TUMS - dosed in mg elemental calcium  Chew 1 tablet by mouth See admin instructions. Before meals   cholecalciferol 25 MCG (1000 UNIT) tablet Commonly known as: VITAMIN D3 Take 1,000 Units by mouth daily.   donepezil  10 MG tablet Commonly known as: ARICEPT  Take 1 tablet (10 mg total) by mouth at bedtime.   doxycycline  100 MG capsule Commonly known as: VIBRAMYCIN  Take 100 mg by mouth 2 (two) times daily.   lactose free nutrition Liqd Take 237 mLs by mouth 2 (two) times daily between meals.   Ensure Active High Protein Liqd Take 1 Can by mouth in the morning, at noon, in the evening, and at bedtime.   ferrous sulfate 324 MG Tbec Take 324  mg by mouth daily with breakfast.   levothyroxine  75 MCG tablet Commonly known as: SYNTHROID  TAKE 1 TABLET BY MOUTH BEFORE BREAKFAST   loperamide  2 MG tablet Commonly known as: IMODIUM  A-D Take 2 mg by mouth every 6 (six) hours as needed for diarrhea or loose stools.   melatonin 5 MG Tabs Take 1 tablet (5 mg total) by mouth at bedtime.   MULTIVITAMIN ADULTS 50+ PO Take 1 tablet by mouth daily.   OLANZapine  zydis 5 MG disintegrating tablet Commonly known as: ZYPREXA  Take 1 tablet (5 mg total) by mouth 2 (two) times daily as needed (agiation).   Pro-Stat AWC Liqd Take 30 mLs by mouth 2 (two) times daily between meals.   psyllium 0.52 g capsule Commonly known as: REGULOID Take 0.52 g by mouth daily.   saccharomyces boulardii 250 MG capsule Commonly known as: FLORASTOR Take 250 mg by mouth 2 (two) times daily.   Zinc Oxide 12 % Crea Apply topically 2 (two) times daily.               Discharge Care Instructions  (From admission, onward)           Start     Ordered   03/18/24 0000  Discharge wound care:       Comments: Wound care  Daily      Comments: Paint left 2nd digit wound with Betadine, apply Betadine soaked gauze to wound bed and secure with dry gauze and tape   03/18/24 0943            Follow-up Information     Lanell Jacobsen, NP. Schedule an appointment as soon as possible for a visit in 1 week(s).   Specialty: Hematology and Oncology Contact information: [address] [place] [address] [phone]                Allergies  Allergen Reactions   Lactose Intolerance (Gi) Nausea And Vomiting   Sulfa Antibiotics      Other Procedures/Studies: MR FOOT LEFT WO CONTRAST Result Date: 03/17/2024 EXAM: MRI of the left Foot without contrast. 03/16/2024 11:25:17 PM TECHNIQUE: Multiplanar multisequence MRI of the left foot was performed without the administration of intravenous contrast. COMPARISON: Radiographs  03/16/2024 CLINICAL HISTORY: Foot swelling, diabetic, osteomyelitis suspected, xray done. FINDINGS: LIMITATIONS/ARTIFACTS: Motion artifact is present, reducing diagnostic sensitivity and specificity. LISFRANC JOINT: Visualized Lisfranc ligament is intact. No significant Lisfranc interval widening or significant periligamentous edema. BONE MARROW: Low grade marrow edema in the distal phalanx second toe and questionably in the middle phalanx second toe, suspicious for osteomyelitis. GREATER AND LESSER MTP JOINTS: Degenerative findings of the 1st metatarsophalangeal joint. No significant  joint effusion or osseous erosions. Normal alignment. SOFT TISSUES: Plantar subcutaneous edema along the ball of the foot. TENDONS: Visualized flexor and extensor tendons are intact without tenosynovitis. IMPRESSION: 1. Low grade marrow edema in the distal phalanx of the second toe and questionably in the middle phalanx of the second toe, suspicious for osteomyelitis. 2. Plantar subcutaneous edema along the ball of the foot. 3. Degenerative changes of the first metatarsophalangeal joint. 4. Motion artifact reduces diagnostic sensitivity and specificity. Electronically signed by: Ryan Salvage MD 03/17/2024 09:18 AM EST RP Workstation: [id number]   CT ANGIO LOWER EXT BILAT W &/OR WO CONTRAST Result Date: 03/16/2024 EXAM: CTA BILATERAL LOWER EXTREMITY 03/16/2024 04:46:59 PM TECHNIQUE: Contrast-enhanced computed tomography angiography of the lower extremity was performed with multiplanar reconstructions. Maximum intensity projection images were created on a separate workstation and reviewed. Automated exposure control, iterative reconstruction, and/or weight based adjustment of the mA/kV was utilized to reduce the radiation dose to as low as reasonably achievable. COMPARISON: CT pelvis 02/12/2024 and CT abdomen and pelvis 10/22/2022. CLINICAL HISTORY: Claudication or leg ischemia. FINDINGS: ARTERIAL: AORTA: The visualized portion  of the lower abdominal aorta demonstrates an infrarenal abdominal aortic aneurysm measuring 3.7 cm AP diameter, unchanged since prior studies. There is mural thrombus and circumferential mural calcification. Diffuse vascular calcifications. LEFT COMMON ILIAC ARTERY: Diffuse calcification. No focal stenosis or occlusion. LEFT EXTERNAL ILIAC ARTERY: Patent with diffuse calcification. RIGHT COMMON ILIAC ARTERY: Diffuse calcification. No focal stenosis or occlusion. RIGHT EXTERNAL ILIAC ARTERY: Patent with diffuse calcification. LEFT INTERNAL ILIAC ARTERY: Diffuse disease although it is patent. RIGHT INTERNAL ILIAC ARTERY: Proximal occlusion with long segment thrombosis and distal reconstitution. LEFT COMMON FEMORAL ARTERY: Patent. LEFT DEEP FEMORAL ARTERY: Patent. LEFT SUPERFICIAL FEMORAL ARTERY: Diffuse disease throughout with high-grade stenosis of the mid/distal superficial femoral artery up to 90% diameter reduction. LEFT POPLITEAL ARTERY: Occlusion of the origin without flow identified. LEFT TIBIOPERONEAL TRUNK: No significant stenosis or vessel occlusion. LEFT ANTERIOR TIBIAL ARTERY: Diffuse disease with multiple areas of high-grade stenosis. Segmental flow is demonstrated with 2-vessel flow to the left ankle via diseased anterior and posterior tibial arteries. LEFT PERONEAL ARTERY: Diffuse disease with multiple areas of high-grade stenosis. Segmental flow is demonstrated. LEFT POSTERIOR TIBIAL ARTERY: Diffuse disease with multiple areas of high-grade stenosis. Segmental flow is demonstrated with 2-vessel flow to the left ankle via diseased anterior and posterior tibial arteries. RIGHT COMMON FEMORAL ARTERY: Patent. RIGHT SUPERFICIAL FEMORAL ARTERY: Focal areas of high-grade stenoses are demonstrated in the mid to distal superficial femoral artery representing up to 80% diameter reduction. RIGHT DEEP FEMORAL ARTERY: Multiple areas of focal stenosis with absence of flow in the distal deep femoral artery. RIGHT  POPLITEAL ARTERY: Occlusion of the origin with no flow demonstrated. Diffuse calcific stenosis. RIGHT TIBIOPERONEAL TRUNK: No significant stenosis or vessel occlusion. RIGHT ANTERIOR TIBIAL ARTERY: Diffuse disease with focal areas of segmental flow and multiple areas of high-grade stenosis. Limited 2-vessel runoff via diseased anterior and posterior tibial arteries. RIGHT PERONEAL ARTERY: Diffuse disease with focal areas of segmental flow and multiple areas of high-grade stenosis. Limited 2-vessel runoff via diseased anterior and posterior tibial arteries. RIGHT POSTERIOR TIBIAL ARTERY: Diffuse disease with focal areas of segmental flow and multiple areas of high-grade stenosis. Limited 2-vessel runoff via diseased anterior and posterior tibial arteries. BONES AND SOFT TISSUES: Surgical absence of the gallbladder. Multilocular cystic mass suggested in the right upper quadrant measuring 4.4 x 8.5 cm diameter. No significant change since prior study. This likely arises from the head of  the pancreas. The bladder wall is diffusely thickened. This may indicate cystitis. Correlate with urinalysis. Old fracture deformities of the left superior and inferior pubic rami. Degenerative changes in the lower lumbar spine. IMPRESSION: 1. Diffuse multilevel peripheral arterial disease with multiple areas of high-grade stenosis in the right superficial femoral artery (up to 80% diameter reduction) and occlusion of the right popliteal artery origin, with limited two-vessel runoff via diseased anterior and posterior tibial arteries. 2. Diffuse multilevel peripheral arterial disease throughout the left lower extremity with high-grade stenosis of the mid/distal superficial femoral artery (up to 90% diameter reduction) and occlusion of the popliteal artery origin, with segmental flow and two-vessel runoff to the ankle via diseased anterior and posterior tibial arteries. 3. Proximal occlusion of the right internal iliac artery with  long-segment thrombosis and distal reconstitution, with diffuse disease of the left internal iliac artery that remains patent. 4. Infrarenal abdominal aortic aneurysm measuring 3.7 cm AP diameter with mural thrombus and circumferential mural calcification, unchanged from prior studies. 5. 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 6. Diffuse bladder wall thickening, possibly indicating cystitis. Electronically signed by: Elsie Gravely MD 03/16/2024 05:16 PM EST RP Workstation: [id number]   DG Foot 2 Views Left Result Date: 03/16/2024 EXAM: 2 VIEW(S) XRAY OF THE LEFT FOOT 03/16/2024 03:54:00 PM COMPARISON: None available. CLINICAL HISTORY: osteo 2nd toe? FINDINGS: BONES AND JOINTS: Osseous demineralization. Assessment of second digit limited by positioning. First MTP degenerative changes. No acute fracture. No joint dislocation. SOFT TISSUES: Second toe soft tissue swelling with possible gas. Vascular calcifications. IMPRESSION: 1. Assessment of the second digit limited by positioning, generalized demineralization of the foot without frank osseous destructive change of the second toe. Emphasis views of the second toe versus MRI could be considered for further assessment depending on the level of concern 2. Second toe soft tissue swelling with possible gas artifact at the distal digit . 3. First MTP degenerative changes. Electronically signed by: Luke Bun MD 03/16/2024 04:41 PM EST RP Workstation: [id number]     TODAY-DAY OF DISCHARGE:  Subjective:   Dawn Conner today remains pleasantly confused.  Objective:   Blood pressure (!) 136/110, pulse (!) 38, temperature 98.5 F (36.9 C), temperature source Axillary, resp. rate 17, height 4' 11 (1.499 m), weight 41.3 kg, SpO2 98%.  Intake/Output Summary (Last 24 hours) at 03/18/2024 0943 Last data filed at 03/17/2024 1848 Gross per 24 hour   Intake --  Output 150 ml  Net -150 ml   Filed Weights   03/16/24 1527  Weight: 41.3 kg    Exam: Awake  No new F.N deficits, Normal affect King.AT,PERRAL Supple Neck,No JVD, No cervical lymphadenopathy appriciated.  Symmetrical Chest wall movement, Good air movement bilaterally, CTAB RRR,No Gallops,Rubs or new Murmurs, No Parasternal Heave +ve B.Sounds, Abd Soft, Non tender, No organomegaly appriciated, No rebound -guarding or rigidity. No Cyanosis, Clubbing or edema, No new Rash or bruise   PERTINENT RADIOLOGIC STUDIES: MR FOOT LEFT WO CONTRAST Result Date: 03/17/2024 EXAM: MRI of the left Foot without contrast. 03/16/2024 11:25:17 PM TECHNIQUE: Multiplanar multisequence MRI of the left foot was performed without the administration of intravenous contrast. COMPARISON: Radiographs 03/16/2024 CLINICAL HISTORY: Foot swelling, diabetic, osteomyelitis suspected, xray done. FINDINGS: LIMITATIONS/ARTIFACTS: Motion artifact is present, reducing diagnostic sensitivity and specificity. LISFRANC JOINT: Visualized Lisfranc ligament is intact. No significant Lisfranc interval widening or significant periligamentous edema. BONE MARROW: Low grade marrow  edema in the distal phalanx second toe and questionably in the middle phalanx second toe, suspicious for osteomyelitis. GREATER AND LESSER MTP JOINTS: Degenerative findings of the 1st metatarsophalangeal joint. No significant joint effusion or osseous erosions. Normal alignment. SOFT TISSUES: Plantar subcutaneous edema along the ball of the foot. TENDONS: Visualized flexor and extensor tendons are intact without tenosynovitis. IMPRESSION: 1. Low grade marrow edema in the distal phalanx of the second toe and questionably in the middle phalanx of the second toe, suspicious for osteomyelitis. 2. Plantar subcutaneous edema along the ball of the foot. 3. Degenerative changes of the first metatarsophalangeal joint. 4. Motion artifact reduces diagnostic sensitivity  and specificity. Electronically signed by: Ryan Salvage MD 03/17/2024 09:18 AM EST RP Workstation: [id number]   CT ANGIO LOWER EXT BILAT W &/OR WO CONTRAST Result Date: 03/16/2024 EXAM: CTA BILATERAL LOWER EXTREMITY 03/16/2024 04:46:59 PM TECHNIQUE: Contrast-enhanced computed tomography angiography of the lower extremity was performed with multiplanar reconstructions. Maximum intensity projection images were created on a separate workstation and reviewed. Automated exposure control, iterative reconstruction, and/or weight based adjustment of the mA/kV was utilized to reduce the radiation dose to as low as reasonably achievable. COMPARISON: CT pelvis 02/12/2024 and CT abdomen and pelvis 10/22/2022. CLINICAL HISTORY: Claudication or leg ischemia. FINDINGS: ARTERIAL: AORTA: The visualized portion of the lower abdominal aorta demonstrates an infrarenal abdominal aortic aneurysm measuring 3.7 cm AP diameter, unchanged since prior studies. There is mural thrombus and circumferential mural calcification. Diffuse vascular calcifications. LEFT COMMON ILIAC ARTERY: Diffuse calcification. No focal stenosis or occlusion. LEFT EXTERNAL ILIAC ARTERY: Patent with diffuse calcification. RIGHT COMMON ILIAC ARTERY: Diffuse calcification. No focal stenosis or occlusion. RIGHT EXTERNAL ILIAC ARTERY: Patent with diffuse calcification. LEFT INTERNAL ILIAC ARTERY: Diffuse disease although it is patent. RIGHT INTERNAL ILIAC ARTERY: Proximal occlusion with long segment thrombosis and distal reconstitution. LEFT COMMON FEMORAL ARTERY: Patent. LEFT DEEP FEMORAL ARTERY: Patent. LEFT SUPERFICIAL FEMORAL ARTERY: Diffuse disease throughout with high-grade stenosis of the mid/distal superficial femoral artery up to 90% diameter reduction. LEFT POPLITEAL ARTERY: Occlusion of the origin without flow identified. LEFT TIBIOPERONEAL TRUNK: No significant stenosis or vessel occlusion. LEFT ANTERIOR TIBIAL ARTERY: Diffuse disease with multiple  areas of high-grade stenosis. Segmental flow is demonstrated with 2-vessel flow to the left ankle via diseased anterior and posterior tibial arteries. LEFT PERONEAL ARTERY: Diffuse disease with multiple areas of high-grade stenosis. Segmental flow is demonstrated. LEFT POSTERIOR TIBIAL ARTERY: Diffuse disease with multiple areas of high-grade stenosis. Segmental flow is demonstrated with 2-vessel flow to the left ankle via diseased anterior and posterior tibial arteries. RIGHT COMMON FEMORAL ARTERY: Patent. RIGHT SUPERFICIAL FEMORAL ARTERY: Focal areas of high-grade stenoses are demonstrated in the mid to distal superficial femoral artery representing up to 80% diameter reduction. RIGHT DEEP FEMORAL ARTERY: Multiple areas of focal stenosis with absence of flow in the distal deep femoral artery. RIGHT POPLITEAL ARTERY: Occlusion of the origin with no flow demonstrated. Diffuse calcific stenosis. RIGHT TIBIOPERONEAL TRUNK: No significant stenosis or vessel occlusion. RIGHT ANTERIOR TIBIAL ARTERY: Diffuse disease with focal areas of segmental flow and multiple areas of high-grade stenosis. Limited 2-vessel runoff via diseased anterior and posterior tibial arteries. RIGHT PERONEAL ARTERY: Diffuse disease with focal areas of segmental flow and multiple areas of high-grade stenosis. Limited 2-vessel runoff via diseased anterior and posterior tibial arteries. RIGHT POSTERIOR TIBIAL ARTERY: Diffuse disease with focal areas of segmental flow and multiple areas of high-grade stenosis. Limited 2-vessel runoff via diseased anterior and posterior tibial arteries. BONES AND SOFT TISSUES: Surgical  absence of the gallbladder. Multilocular cystic mass suggested in the right upper quadrant measuring 4.4 x 8.5 cm diameter. No significant change since prior study. This likely arises from the head of the pancreas. The bladder wall is diffusely thickened. This may indicate cystitis. Correlate with urinalysis. Old fracture deformities of  the left superior and inferior pubic rami. Degenerative changes in the lower lumbar spine. IMPRESSION: 1. Diffuse multilevel peripheral arterial disease with multiple areas of high-grade stenosis in the right superficial femoral artery (up to 80% diameter reduction) and occlusion of the right popliteal artery origin, with limited two-vessel runoff via diseased anterior and posterior tibial arteries. 2. Diffuse multilevel peripheral arterial disease throughout the left lower extremity with high-grade stenosis of the mid/distal superficial femoral artery (up to 90% diameter reduction) and occlusion of the popliteal artery origin, with segmental flow and two-vessel runoff to the ankle via diseased anterior and posterior tibial arteries. 3. Proximal occlusion of the right internal iliac artery with long-segment thrombosis and distal reconstitution, with diffuse disease of the left internal iliac artery that remains patent. 4. Infrarenal abdominal aortic aneurysm measuring 3.7 cm AP diameter with mural thrombus and circumferential mural calcification, unchanged from prior studies. 5. 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 6. Diffuse bladder wall thickening, possibly indicating cystitis. Electronically signed by: Elsie Gravely MD 03/16/2024 05:16 PM EST RP Workstation: [id number]   DG Foot 2 Views Left Result Date: 03/16/2024 EXAM: 2 VIEW(S) XRAY OF THE LEFT FOOT 03/16/2024 03:54:00 PM COMPARISON: None available. CLINICAL HISTORY: osteo 2nd toe? FINDINGS: BONES AND JOINTS: Osseous demineralization. Assessment of second digit limited by positioning. First MTP degenerative changes. No acute fracture. No joint dislocation. SOFT TISSUES: Second toe soft tissue swelling with possible gas. Vascular calcifications. IMPRESSION: 1. Assessment of the second digit limited by positioning, generalized  demineralization of the foot without frank osseous destructive change of the second toe. Emphasis views of the second toe versus MRI could be considered for further assessment depending on the level of concern 2. Second toe soft tissue swelling with possible gas artifact at the distal digit . 3. First MTP degenerative changes. Electronically signed by: Luke Bun MD 03/16/2024 04:41 PM EST RP Workstation: [id number]     PERTINENT LAB RESULTS: CBC: Recent Labs    03/17/24 0245 03/18/24 0249  WBC 10.7* 8.9  HGB 11.9* 10.9*  HCT 38.5 34.8*  PLT 244 277   CMET CMP     Component Value Date/Time   NA 137 03/18/2024 0249   K 4.1 03/18/2024 0249   CL 103 03/18/2024 0249   CO2 25 03/18/2024 0249   GLUCOSE 82 03/18/2024 0249   BUN 18 03/18/2024 0249   CREATININE 1.26 (H) 03/18/2024 0249   CREATININE 1.10 (H) 10/10/2022 1612   CALCIUM  8.5 (L) 03/18/2024 0249   PROT 6.6 03/17/2024 0245   ALBUMIN 2.3 (L) 03/17/2024 0245   AST 23 03/17/2024 0245   ALT 14 03/17/2024 0245   ALKPHOS 57 03/17/2024 0245   BILITOT 0.5 03/17/2024 0245   EGFR 47 (L) 10/10/2022 1612   GFRNONAA 40 (L) 03/18/2024 0249   GFRNONAA 36 (L) 10/13/2020 1105    GFR Estimated Creatinine Clearance: 17.8 mL/min (A) (by C-G formula based on SCr of 1.26 mg/dL (H)). No results for input(s): LIPASE, AMYLASE in the last 72 hours. No results for input(s): CKTOTAL, CKMB, CKMBINDEX, TROPONINI in the last 72 hours. Invalid input(s):  POCBNP No results for input(s): DDIMER in the last 72 hours. Recent Labs    03/17/24 0245  HGBA1C 6.0*   No results for input(s): CHOL, HDL, LDLCALC, TRIG, CHOLHDL, LDLDIRECT in the last 72 hours. No results for input(s): TSH, T4TOTAL, T3FREE, THYROIDAB in the last 72 hours.  Invalid input(s): FREET3 No results for input(s): VITAMINB12, FOLATE, FERRITIN, TIBC, IRON, RETICCTPCT in the last 72 hours. Coags: No results for input(s): INR in  the last 72 hours.  Invalid input(s): PT Microbiology: Recent Results (from the past 240 hours)  Culture, blood (single)     Status: None (Preliminary result)   Collection Time: 03/16/24  5:36 PM   Specimen: BLOOD RIGHT HAND  Result Value Ref Range Status   Specimen Description BLOOD RIGHT HAND  Final   Special Requests   Final    BOTTLES DRAWN AEROBIC AND ANAEROBIC Blood Culture results may not be optimal due to an inadequate volume of blood received in culture bottles   Culture   Final    NO GROWTH 2 DAYS Performed at [facility], [address], [place], [place] [address]    Report Status PENDING  Incomplete    FURTHER DISCHARGE INSTRUCTIONS:  Get Medicines reviewed and adjusted: Please take all your medications with you for your next visit with your Primary MD  Laboratory/radiological data: Please request your Primary MD to go over all hospital tests and procedure/radiological results at the follow up, please ask your Primary MD to get all Hospital records sent to his/her office.  In some cases, they will be blood work, cultures and biopsy results pending at the time of your discharge. Please request that your primary care M.D. goes through all the records of your hospital data and follows up on these results.  Also Note the following: If you experience worsening of your admission symptoms, develop shortness of breath, life threatening emergency, suicidal or homicidal thoughts you must seek medical attention immediately by calling 911 or calling your MD immediately  if symptoms less severe.  You must read complete instructions/literature along with all the possible adverse reactions/side effects for all the Medicines you take and that have been prescribed to you. Take any new Medicines after you have completely understood and accpet all the possible adverse reactions/side effects.   Do not drive when taking Pain medications or sleeping medications  (Benzodaizepines)  Do not take more than prescribed Pain, Sleep and Anxiety Medications. It is not advisable to combine anxiety,sleep and pain medications without talking with your primary care practitioner  Special Instructions: If you have smoked or chewed Tobacco  in the last 2 yrs please stop smoking, stop any regular Alcohol  and or any Recreational drug use.  Wear Seat belts while driving.  Please note: You were cared for by a hospitalist during your hospital stay. Once you are discharged, your primary care physician will handle any further medical issues. Please note that NO REFILLS for any discharge medications will be authorized once you are discharged, as it is imperative that you return to your primary care physician (or establish a relationship with a primary care physician if you do not have one) for your post hospital discharge needs so that they can reassess your need for medications and monitor your lab values.  Total Time spent coordinating discharge including counseling, education and face to face time equals greater than 30 minutes.  SignedBETHA Donalda Applebaum 03/18/2024 9:43 AM

## 2024-03-18 NOTE — Progress Notes (Addendum)
 PROGRESS NOTE        PATIENT DETAILS Name: Dawn Conner Age: 89 y.o. Sex: female Date of Birth: 10-25-1927 Admit Date: 03/16/2024 Admitting Physician Editha Ram, MD ERE:Ezmxpwd, Morna, NP  Brief Summary: Patient is a 89 y.o.  female with history of dementia, HLD, hypothyroidism-presented with cold left foot and ulcer on the second toe of her left foot.  Significant events: 11/25>> admit to [facility]  Significant studies: 11/25>> CT angio lower extremity: Multilevel PAD with areas of high-grade stenosis in right SFA, occlusion of right popliteal artery, high-grade stenosis of mid SFA, occlusion of popliteal artery at origin. 11/25>> MRI left foot: Osteomyelitis of middle phalanx of second toe.  Significant microbiology data: 1125>> blood culture: No growth  Procedures: None  Consults: Vascular surgery  Subjective: Lying comfortably in bed-pleasantly confused-follows some commands occasionally.  No family at bedside.  Objective: Vitals: Blood pressure (!) 136/110, pulse (!) 38, temperature 98.5 F (36.9 C), temperature source Axillary, resp. rate 17, height 4' 11 (1.499 m), weight 41.3 kg, SpO2 98%.   Exam: Gen Exam: Pleasantly confused-not in any distress HEENT:atraumatic, normocephalic Chest: B/L clear to auscultation anteriorly CVS:S1S2 regular Abdomen:soft non tender, non distended Extremities: Both foot appear to be warm-dry gangrenous ulcer in the plantar surface of the left second toe. Neurology: Non focal Skin: no rash  Pertinent Labs/Radiology:    Latest Ref Rng & Units 03/18/2024    2:49 AM 03/17/2024    2:45 AM 03/16/2024    3:42 PM  CBC  WBC 4.0 - 10.5 K/uL 8.9  10.7    Hemoglobin 12.0 - 15.0 g/dL 89.0  88.0  87.0   Hematocrit 36.0 - 46.0 % 34.8  38.5  38.0   Platelets 150 - 400 K/uL 277  244      Lab Results  Component Value Date   NA 137 03/18/2024   K 4.1 03/18/2024   CL 103 03/18/2024   CO2 25 03/18/2024       Assessment/Plan: Left second toe dry gangrene with ulceration and underlying acute osteomyelitis PAD with left lower extremity critical ischemia Appears stable overnight Appreciate input from vascular surgery-prior MD (Dr. Arlice) had also discussed with podiatry-and then subsequently with family-plans are for conservative treatment with antibiotics/wound care.   Per vascular surgery-if patient develops intractable pain or wound gets infected-patient will require left BKA (deemed not necessary at this point) Given overall stability-lack of sepsis physiology-suspect we can stop IV antibiotics and switch her to Augmentin  for now. Continue aspirin /statin  CKD stage IIIb Close to baseline  Normocytic anemia Likely secondary to CKD Hb stable-anemia appears to be mild Stable for outpatient follow-up.  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  Dementia with mild delirium Continue Aricept  Add melatonin/Seroquel  Maintain delirium precautions  Code status:   Code Status: Limited: Do not attempt resuscitation (DNR) -DNR-LIMITED -Do Not Intubate/DNI    DVT Prophylaxis: enoxaparin  (LOVENOX ) injection 40 mg Start: 03/18/24 1030 SCDs Start: 03/16/24 2054    Family Communication: Daughter Carol-[phone]-updated over the phone 11/27   Disposition Plan:  Status is: Inpatient Remains inpatient appropriate because: Severity of illness   Planned Discharge Destination:Home   Diet: Diet Order             DIET DYS 2 Room service appropriate?  Yes; Fluid consistency: Thin  Diet effective now                     Antimicrobial agents: Anti-infectives (From admission, onward)    Start     Dose/Rate Route Frequency Ordered Stop   03/18/24 1800  vancomycin  (VANCOREADY) IVPB 500 mg/100 mL  Status:  Discontinued        500 mg 100 mL/hr over 60 Minutes Intravenous Every 48 hours 03/16/24 2131 03/17/24 0929   03/18/24 1800  vancomycin  (VANCOREADY) IVPB 500 mg/100 mL  Status:  Discontinued        500 mg 100 mL/hr over 60 Minutes Intravenous Every 36 hours 03/17/24 0929 03/18/24 0930   03/18/24 1030  amoxicillin -clavulanate (AUGMENTIN ) 875-125 MG per tablet 1 tablet        1 tablet Oral Every 12 hours 03/18/24 0930     03/17/24 1700  cefTRIAXone  (ROCEPHIN ) 2 g in sodium chloride  0.9 % 100 mL IVPB  Status:  Discontinued        2 g 200 mL/hr over 30 Minutes Intravenous Every 24 hours 03/16/24 2054 03/18/24 0930   03/16/24 1745  cefTRIAXone  (ROCEPHIN ) 2 g in sodium chloride  0.9 % 100 mL IVPB        2 g 200 mL/hr over 30 Minutes Intravenous Once 03/16/24 1736 03/16/24 1825   03/16/24 1745  vancomycin  (VANCOCIN ) IVPB 1000 mg/200 mL premix        1,000 mg 200 mL/hr over 60 Minutes Intravenous  Once 03/16/24 1736 03/16/24 2055        MEDICATIONS: Scheduled Meds:  amoxicillin -clavulanate  1 tablet Oral Q12H   atorvastatin   40 mg Oral Daily   donepezil   10 mg Oral QHS   enoxaparin  (LOVENOX ) injection  40 mg Subcutaneous Q24H   feeding supplement  1 Container Oral TID WC   levothyroxine   75 mcg Oral Q0600   melatonin  5 mg Oral QHS   QUEtiapine   25 mg Oral QHS   Continuous Infusions:   PRN Meds:.acetaminophen  **OR** acetaminophen , OLANZapine  zydis   I have personally reviewed following labs and imaging studies  LABORATORY DATA: CBC: Recent Labs  Lab 03/16/24 1521 03/16/24 1542 03/17/24 0245 03/18/24 0249  WBC 10.5  --  10.7* 8.9  NEUTROABS 8.0*  --   --  6.2  HGB 11.8* 12.9 11.9* 10.9*  HCT 38.4 38.0 38.5 34.8*   MCV 84.4  --  83.7 82.5  PLT 303  --  244 277    Basic Metabolic Panel: Recent Labs  Lab 03/16/24 1521 03/16/24 1542 03/17/24 0245 03/18/24 0249  NA 134* 139 140 137  K 3.3* 3.6 4.0 4.1  CL 102 104 105 103  CO2 21*  --  26 25  GLUCOSE 165* 159* 105* 82  BUN 19 25* 10 18  CREATININE 1.27* 1.30* 1.08* 1.26*  CALCIUM  9.0  --  8.7* 8.5*  MG  --   --  1.8  --     GFR: Estimated Creatinine Clearance: 17.8 mL/min (A) (by C-G formula based on SCr of 1.26 mg/dL (H)).  Liver Function Tests: Recent Labs  Lab 03/17/24 0245  AST 23  ALT 14  ALKPHOS 57  BILITOT 0.5  PROT 6.6  ALBUMIN 2.3*   No results  for input(s): LIPASE, AMYLASE in the last 168 hours. No results for input(s): AMMONIA in the last 168 hours.  Coagulation Profile: No results for input(s): INR, PROTIME in the last 168 hours.  Cardiac Enzymes: No results for input(s): CKTOTAL, CKMB, CKMBINDEX, TROPONINI in the last 168 hours.  BNP (last 3 results) No results for input(s): PROBNP in the last 8760 hours.  Lipid Profile: No results for input(s): CHOL, HDL, LDLCALC, TRIG, CHOLHDL, LDLDIRECT in the last 72 hours.  Thyroid  Function Tests: No results for input(s): TSH, T4TOTAL, FREET4, T3FREE, THYROIDAB in the last 72 hours.  Anemia Panel: No results for input(s): VITAMINB12, FOLATE, FERRITIN, TIBC, IRON, RETICCTPCT in the last 72 hours.  Urine analysis:    Component Value Date/Time   COLORURINE STRAW (A) 03/17/2024 0225   APPEARANCEUR HAZY (A) 03/17/2024 0225   LABSPEC 1.011 03/17/2024 0225   PHURINE 7.0 03/17/2024 0225   GLUCOSEU NEGATIVE 03/17/2024 0225   HGBUR SMALL (A) 03/17/2024 0225   BILIRUBINUR NEGATIVE 03/17/2024 0225   BILIRUBINUR negative 06/28/2022 1307   KETONESUR NEGATIVE 03/17/2024 0225   PROTEINUR NEGATIVE 03/17/2024 0225   UROBILINOGEN 0.2 06/28/2022 1307   NITRITE NEGATIVE 03/17/2024 0225   LEUKOCYTESUR LARGE (A) 03/17/2024 0225     Sepsis Labs: Lactic Acid, Venous    Component Value Date/Time   LATICACIDVEN 1.1 03/16/2024 1733    MICROBIOLOGY: Recent Results (from the past 240 hours)  Culture, blood (single)     Status: None (Preliminary result)   Collection Time: 03/16/24  5:36 PM   Specimen: BLOOD RIGHT HAND  Result Value Ref Range Status   Specimen Description BLOOD RIGHT HAND  Final   Special Requests   Final    BOTTLES DRAWN AEROBIC AND ANAEROBIC Blood Culture results may not be optimal due to an inadequate volume of blood received in culture bottles   Culture   Final    NO GROWTH 2 DAYS Performed at [facility], [address] [place], [place] [address]    Report Status PENDING  Incomplete    RADIOLOGY STUDIES/RESULTS: MR FOOT LEFT WO CONTRAST Result Date: 03/17/2024 EXAM: MRI of the left Foot without contrast. 03/16/2024 11:25:17 PM TECHNIQUE: Multiplanar multisequence MRI of the left foot was performed without the administration of intravenous contrast. COMPARISON: Radiographs 03/16/2024 CLINICAL HISTORY: Foot swelling, diabetic, osteomyelitis suspected, xray done. FINDINGS: LIMITATIONS/ARTIFACTS: Motion artifact is present, reducing diagnostic sensitivity and specificity. LISFRANC JOINT: Visualized Lisfranc ligament is intact. No significant Lisfranc interval widening or significant periligamentous edema. BONE MARROW: Low grade marrow edema in the distal phalanx second toe and questionably in the middle phalanx second toe, suspicious for osteomyelitis. GREATER AND LESSER MTP JOINTS: Degenerative findings of the 1st metatarsophalangeal joint. No significant joint effusion or osseous erosions. Normal alignment. SOFT TISSUES: Plantar subcutaneous edema along the ball of the foot. TENDONS: Visualized flexor and extensor tendons are intact without tenosynovitis. IMPRESSION: 1. Low grade marrow edema in the distal phalanx of the second toe and questionably in the middle phalanx of the second toe,  suspicious for osteomyelitis. 2. Plantar subcutaneous edema along the ball of the foot. 3. Degenerative changes of the first metatarsophalangeal joint. 4. Motion artifact reduces diagnostic sensitivity and specificity. Electronically signed by: Ryan Salvage MD 03/17/2024 09:18 AM EST RP Workstation: [id number]   CT ANGIO LOWER EXT BILAT W &/OR WO CONTRAST Result Date: 03/16/2024 EXAM: CTA BILATERAL LOWER EXTREMITY 03/16/2024 04:46:59 PM TECHNIQUE: Contrast-enhanced computed tomography angiography of the lower extremity was performed with multiplanar reconstructions. Maximum intensity projection images  were created on a separate workstation and reviewed. Automated exposure control, iterative reconstruction, and/or weight based adjustment of the mA/kV was utilized to reduce the radiation dose to as low as reasonably achievable. COMPARISON: CT pelvis 02/12/2024 and CT abdomen and pelvis 10/22/2022. CLINICAL HISTORY: Claudication or leg ischemia. FINDINGS: ARTERIAL: AORTA: The visualized portion of the lower abdominal aorta demonstrates an infrarenal abdominal aortic aneurysm measuring 3.7 cm AP diameter, unchanged since prior studies. There is mural thrombus and circumferential mural calcification. Diffuse vascular calcifications. LEFT COMMON ILIAC ARTERY: Diffuse calcification. No focal stenosis or occlusion. LEFT EXTERNAL ILIAC ARTERY: Patent with diffuse calcification. RIGHT COMMON ILIAC ARTERY: Diffuse calcification. No focal stenosis or occlusion. RIGHT EXTERNAL ILIAC ARTERY: Patent with diffuse calcification. LEFT INTERNAL ILIAC ARTERY: Diffuse disease although it is patent. RIGHT INTERNAL ILIAC ARTERY: Proximal occlusion with long segment thrombosis and distal reconstitution. LEFT COMMON FEMORAL ARTERY: Patent. LEFT DEEP FEMORAL ARTERY: Patent. LEFT SUPERFICIAL FEMORAL ARTERY: Diffuse disease throughout with high-grade stenosis of the mid/distal superficial femoral artery up to 90% diameter reduction.  LEFT POPLITEAL ARTERY: Occlusion of the origin without flow identified. LEFT TIBIOPERONEAL TRUNK: No significant stenosis or vessel occlusion. LEFT ANTERIOR TIBIAL ARTERY: Diffuse disease with multiple areas of high-grade stenosis. Segmental flow is demonstrated with 2-vessel flow to the left ankle via diseased anterior and posterior tibial arteries. LEFT PERONEAL ARTERY: Diffuse disease with multiple areas of high-grade stenosis. Segmental flow is demonstrated. LEFT POSTERIOR TIBIAL ARTERY: Diffuse disease with multiple areas of high-grade stenosis. Segmental flow is demonstrated with 2-vessel flow to the left ankle via diseased anterior and posterior tibial arteries. RIGHT COMMON FEMORAL ARTERY: Patent. RIGHT SUPERFICIAL FEMORAL ARTERY: Focal areas of high-grade stenoses are demonstrated in the mid to distal superficial femoral artery representing up to 80% diameter reduction. RIGHT DEEP FEMORAL ARTERY: Multiple areas of focal stenosis with absence of flow in the distal deep femoral artery. RIGHT POPLITEAL ARTERY: Occlusion of the origin with no flow demonstrated. Diffuse calcific stenosis. RIGHT TIBIOPERONEAL TRUNK: No significant stenosis or vessel occlusion. RIGHT ANTERIOR TIBIAL ARTERY: Diffuse disease with focal areas of segmental flow and multiple areas of high-grade stenosis. Limited 2-vessel runoff via diseased anterior and posterior tibial arteries. RIGHT PERONEAL ARTERY: Diffuse disease with focal areas of segmental flow and multiple areas of high-grade stenosis. Limited 2-vessel runoff via diseased anterior and posterior tibial arteries. RIGHT POSTERIOR TIBIAL ARTERY: Diffuse disease with focal areas of segmental flow and multiple areas of high-grade stenosis. Limited 2-vessel runoff via diseased anterior and posterior tibial arteries. BONES AND SOFT TISSUES: Surgical absence of the gallbladder. Multilocular cystic mass suggested in the right upper quadrant measuring 4.4 x 8.5 cm diameter. No  significant change since prior study. This likely arises from the head of the pancreas. The bladder wall is diffusely thickened. This may indicate cystitis. Correlate with urinalysis. Old fracture deformities of the left superior and inferior pubic rami. Degenerative changes in the lower lumbar spine. IMPRESSION: 1. Diffuse multilevel peripheral arterial disease with multiple areas of high-grade stenosis in the right superficial femoral artery (up to 80% diameter reduction) and occlusion of the right popliteal artery origin, with limited two-vessel runoff via diseased anterior and posterior tibial arteries. 2. Diffuse multilevel peripheral arterial disease throughout the left lower extremity with high-grade stenosis of the mid/distal superficial femoral artery (up to 90% diameter reduction) and occlusion of the popliteal artery origin, with segmental flow and two-vessel runoff to the ankle via diseased anterior and posterior tibial arteries. 3. Proximal occlusion of the right internal iliac artery with  long-segment thrombosis and distal reconstitution, with diffuse disease of the left internal iliac artery that remains patent. 4. Infrarenal abdominal aortic aneurysm measuring 3.7 cm AP diameter with mural thrombus and circumferential mural calcification, unchanged from prior studies. 5. 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 6. Diffuse bladder wall thickening, possibly indicating cystitis. Electronically signed by: Elsie Gravely MD 03/16/2024 05:16 PM EST RP Workstation: [id number]   DG Foot 2 Views Left Result Date: 03/16/2024 EXAM: 2 VIEW(S) XRAY OF THE LEFT FOOT 03/16/2024 03:54:00 PM COMPARISON: None available. CLINICAL HISTORY: osteo 2nd toe? FINDINGS: BONES AND JOINTS: Osseous demineralization. Assessment of second digit limited by positioning. First MTP degenerative changes. No acute  fracture. No joint dislocation. SOFT TISSUES: Second toe soft tissue swelling with possible gas. Vascular calcifications. IMPRESSION: 1. Assessment of the second digit limited by positioning, generalized demineralization of the foot without frank osseous destructive change of the second toe. Emphasis views of the second toe versus MRI could be considered for further assessment depending on the level of concern 2. Second toe soft tissue swelling with possible gas artifact at the distal digit . 3. First MTP degenerative changes. Electronically signed by: Luke Bun MD 03/16/2024 04:41 PM EST RP Workstation: [id number]     LOS: 2 days   Donalda Applebaum, MD  [facility]    To contact the attending provider between 7A-7P or the covering provider during after hours 7P-7A, please log into the web site [email or web address] and access using universal [employer] password for that web site. If you do not have the password, please call the hospital operator.  03/18/2024, 9:36 AM

## 2024-03-18 NOTE — Progress Notes (Signed)
  Daily Progress Note  Subjective: Confused, in mittens.  Does not converse much.  Objective: Vitals:   03/18/24 0400 03/18/24 0631  BP: 94/79   Pulse: 80 (!) 38  Resp: 16 17  Temp: 98.4 F (36.9 C)   SpO2: 96% 98%    Physical Examination HDS Nonlabored breathing Dry gangrenous changes to the left second toe. Wakes to verbal stimuli but does not converse much.  ASSESSMENT/PLAN:  89 year old female with severe dementia and a left second toe wound.  She does perform transfers at her living facility and therefore I partner Dr. Gretta discussed with her daughter about limb salvage but also explained that in her current state is unlikely to tolerate angiogram and general anesthesia would be high risk for patient and her age especially with severe dementia.  Therefore the plan going forward is palliative wound care.  Should this become infected or cause severe pain she would likely require left above-knee amputation but this is not necessary at this time.   Norman GORMAN Serve MD [facility] [phone] 03/18/2024  8:15 AM

## 2024-03-18 NOTE — TOC Progression Note (Signed)
 Transition of Care [facility]) - Progression Note    Patient Details  Name: Dawn Conner MRN: [id number] Date of Birth: 1927-08-20  Transition of Care [facility]) CM/SW Contact  Montie LOISE Louder, [place] Phone Number: 03/18/2024, 9:52 AM  Clinical Narrative:     CSW informed by MD patient is stable for d/c. Contacted [facility] Admission, CSW informed they can not take her back today due to their pharmacy is closed.   TOC will continue to follow and assist with discharge planning   Montie Louder, MSW, LCSW Clinical Social Worker                      Expected Discharge Plan and Services                                               Social Drivers of Health (SDOH) Interventions SDOH Screenings   Food Insecurity: No Food Insecurity (02/21/2023)  Housing: Patient Declined (02/21/2023)  Transportation Needs: No Transportation Needs (02/21/2023)  Utilities: Not At Risk (02/21/2023)  Alcohol Screen: Low Risk  (05/03/2022)  Depression (PHQ2-9): Low Risk  (05/03/2022)  Financial Resource Strain: Low Risk  (05/03/2022)  Physical Activity: Insufficiently Active (05/03/2022)  Social Connections: Moderately Isolated (05/03/2022)  Stress: No Stress Concern Present (05/03/2022)  Tobacco Use: Low Risk  (02/12/2024)    Readmission Risk Interventions    02/24/2023   10:29 AM  Readmission Risk Prevention Plan  Transportation Screening Complete  PCP or Specialist Appt within 5-7 Days Complete  Home Care Screening --

## 2024-03-19 DIAGNOSIS — L089 Local infection of the skin and subcutaneous tissue, unspecified: Secondary | ICD-10-CM | POA: Diagnosis not present

## 2024-03-19 DIAGNOSIS — D649 Anemia, unspecified: Secondary | ICD-10-CM | POA: Diagnosis not present

## 2024-03-19 DIAGNOSIS — E871 Hypo-osmolality and hyponatremia: Secondary | ICD-10-CM | POA: Diagnosis not present

## 2024-03-19 DIAGNOSIS — I70229 Atherosclerosis of native arteries of extremities with rest pain, unspecified extremity: Secondary | ICD-10-CM | POA: Diagnosis not present

## 2024-03-19 MED ORDER — PANTOPRAZOLE SODIUM 40 MG PO TBEC: 40.0000 mg | DELAYED_RELEASE_TABLET | Freq: Every day | ORAL | Status: AC

## 2024-03-19 MED ORDER — ASPIRIN 325 MG PO TABS
325.0000 mg | ORAL_TABLET | Freq: Every day | ORAL | Status: DC
  Administered 2024-03-19: 325 mg via ORAL
  Filled 2024-03-19: qty 1

## 2024-03-19 MED ORDER — BOOST PO LIQD: 237.0000 mL | Freq: Two times a day (BID) | ORAL | Status: DC

## 2024-03-19 MED ORDER — BOOST PO LIQD
237.0000 mL | Freq: Two times a day (BID) | ORAL | Status: DC
  Administered 2024-03-19: 237 mL via ORAL
  Filled 2024-03-19: qty 237

## 2024-03-19 MED ORDER — PANTOPRAZOLE SODIUM 40 MG PO TBEC
40.0000 mg | DELAYED_RELEASE_TABLET | Freq: Every day | ORAL | Status: DC
  Administered 2024-03-19: 40 mg via ORAL
  Filled 2024-03-19: qty 1

## 2024-03-19 NOTE — Progress Notes (Signed)
 Dressing changed as ordered. Unable to wrap the wound after painting betadine as patient is uncooperative and is hurting everytime the wound is touched.

## 2024-03-19 NOTE — [place] FL2 (Signed)
 [place]  MEDICAID FL2 LEVEL OF CARE FORM     IDENTIFICATION  Patient Name: Dawn Conner Birthdate: [birth year] Sex: female Admission Date (Current Location): 03/16/2024  [facility] and [place] Number:  Producer, Television/film/video and Address:         Provider Number: [id number]  Attending Physician Name and Address:  Raenelle Donalda HERO, MD  Relative Name and Phone Number:  Niels Lover, daughter [phone])    Current Level of Care: Hospital Recommended Level of Care: Skilled Nursing Facility Prior Approval Number:    Date Approved/Denied:   PASRR Number: [id number]  Discharge Plan: SNF    Current Diagnoses: Patient Active Problem List   Diagnosis Date Noted   Chronic limb-threatening ischemia (HCC) 03/16/2024   Severe peripheral arterial disease 03/16/2024   Normocytic anemia 03/16/2024   Hyponatremia 03/16/2024   Toe infection 03/16/2024   Malnutrition of moderate degree 02/24/2023   UTI (urinary tract infection) 02/23/2023   Closed displaced fracture of pelvis (HCC) 02/21/2023   Subdural hematoma (HCC) 02/21/2023   Scalp wound 02/21/2023   AAA (abdominal aortic aneurysm) 02/21/2023   Pancreatic insufficiency 02/21/2023   Chronic kidney disease, stage 3b (HCC) 02/21/2023   Localized swelling of right lower extremity 02/21/2023   Increased bowel frequency 02/10/2023   Syncope and collapse 02/07/2023   Dementia without behavioral disturbance (HCC) 02/07/2023   Generalized abdominal pain 10/07/2022   Rectal bleeding 07/08/2022   Tinea 07/08/2022   Fall at home, initial encounter 03/19/2022   Perianal irritation 12/20/2021   Prediabetes 09/18/2021   SIRS (systemic inflammatory response syndrome) (HCC) 09/17/2021   Failure to thrive in adult 09/17/2021   Fever 09/17/2021   Leukocytosis 09/17/2021   Generalized weakness 09/17/2021   Chronic confusional state 09/17/2021   Hyperglycemia 09/17/2021   Alternating constipation and diarrhea 01/22/2021   Vascular  dementia (HCC) 10/17/2019   Diarrhea 05/19/2019   Essential hypertension 11/09/2018   Osteoporosis 11/09/2018   Acquired hypothyroidism 11/09/2018   Hyperlipidemia 11/09/2018   Chronic diarrhea 11/09/2018    Orientation RESPIRATION BLADDER Height & Weight      (Disoriented x4)  Normal Incontinent Weight: 91 lb (41.3 kg) Height:  4' 11 (149.9 cm)  BEHAVIORAL SYMPTOMS/MOOD NEUROLOGICAL BOWEL NUTRITION STATUS        Diet (See d/c summary)  AMBULATORY STATUS COMMUNICATION OF NEEDS Skin   Extensive Assist Verbally Other (Comment) (Left second toe dry gangrene with ulceration and underlying acute osteomyelitis)                       Personal Care Assistance Level of Assistance  Bathing, Feeding, Dressing Bathing Assistance: Maximum assistance Feeding assistance: Limited assistance Dressing Assistance: Maximum assistance     Functional Limitations Info  Sight, Hearing Sight Info: Impaired Hearing Info: Impaired      SPECIAL CARE FACTORS FREQUENCY                       Contractures Contractures Info: Not present    Additional Factors Info  Code Status, Allergies Code Status Info: DNR limited Allergies Info: Lactose Intolerance (Gi), Sulfa Antibiotics           Current Medications (03/19/2024):  This is the current hospital active medication list Current Facility-Administered Medications  Medication Dose Route Frequency Provider Last Rate Last Admin   acetaminophen  (TYLENOL ) tablet 650 mg  650 mg Oral Q6H PRN Rathore, Vasundhra, MD   650 mg at 03/19/24 9187   Or   acetaminophen  (TYLENOL ) suppository  650 mg  650 mg Rectal Q6H PRN Rathore, Vasundhra, MD       amoxicillin -clavulanate (AUGMENTIN ) 500-125 MG per tablet 1 tablet  1 tablet Oral Q12H Ghimire, Donalda HERO, MD   1 tablet at 03/19/24 0813   atorvastatin  (LIPITOR) tablet 40 mg  40 mg Oral Daily Rathore, Vasundhra, MD   40 mg at 03/19/24 9187   donepezil  (ARICEPT ) tablet 10 mg  10 mg Oral QHS Rathore,  Vasundhra, MD   10 mg at 03/18/24 2009   enoxaparin  (LOVENOX ) injection 30 mg  30 mg Subcutaneous Q24H Raenelle Donalda HERO, MD   30 mg at 03/18/24 1038   feeding supplement (BOOST / RESOURCE BREEZE) liquid 1 Container  1 Container Oral TID WC Dahal, Binaya, MD   1 Container at 03/19/24 0813   levothyroxine  (SYNTHROID ) tablet 75 mcg  75 mcg Oral Q0600 Rathore, Vasundhra, MD   75 mcg at 03/19/24 0520   melatonin tablet 5 mg  5 mg Oral QHS Ghimire, Shanker M, MD   5 mg at 03/18/24 2008   OLANZapine  zydis (ZYPREXA ) disintegrating tablet 5 mg  5 mg Oral BID PRN Raenelle Donalda HERO, MD       QUEtiapine  (SEROQUEL ) tablet 12.5 mg  12.5 mg Oral QHS Raenelle Donalda HERO, MD   12.5 mg at 03/18/24 2008     Discharge Medications: Please see discharge summary for a list of discharge medications.  Relevant Imaging Results:  Relevant Lab Results:   Additional Information SSN# [id number]  Bernardino Dean, [address]

## 2024-03-19 NOTE — Progress Notes (Signed)
 PROGRESS NOTE        PATIENT DETAILS Name: Dawn Conner Age: 89 y.o. Sex: female Date of Birth: 12-23-1927 Admit Date: 03/16/2024 Admitting Physician Editha Ram, MD ERE:Ezmxpwd, Morna, NP  Brief Summary: Patient is a 89 y.o.  female with history of dementia, HLD, hypothyroidism-presented with cold left foot and ulcer on the second toe of her left foot.  Significant events: 11/25>> admit to [facility]  Significant studies: 11/25>> CT angio lower extremity: Multilevel PAD with areas of high-grade stenosis in right SFA, occlusion of right popliteal artery, high-grade stenosis of mid SFA, occlusion of popliteal artery at origin. 11/25>> MRI left foot: Osteomyelitis of middle phalanx of second toe.  Significant microbiology data: 1125>> blood culture: No growth  Procedures: None  Consults: Vascular surgery  Subjective: Lying comfortably in bed-pleasantly confused-follows some commands occasionally.  No family at bedside.  Objective: Vitals: Blood pressure 105/61, pulse 88, temperature 97.7 F (36.5 C), temperature source Axillary, resp. rate 17, height 4' 11 (1.499 m), weight 41.3 kg, SpO2 97%.   Exam: Gen Exam: Pleasantly confused-not in any distress HEENT:atraumatic, normocephalic Chest: B/L clear to auscultation anteriorly CVS:S1S2 regular Abdomen:soft non tender, non distended Extremities: Both foot appear to be warm-dry gangrenous ulcer in the plantar surface of the left second toe. Neurology: Non focal Skin: no rash  Pertinent Labs/Radiology:    Latest Ref Rng & Units 03/18/2024    2:49 AM 03/17/2024    2:45 AM 03/16/2024    3:42 PM  CBC  WBC 4.0 - 10.5 K/uL 8.9  10.7    Hemoglobin 12.0 - 15.0 g/dL 89.0  88.0  87.0   Hematocrit 36.0 - 46.0 % 34.8  38.5  38.0   Platelets 150 - 400 K/uL 277  244      Lab Results  Component Value Date   NA 137 03/18/2024   K 4.1 03/18/2024   CL 103 03/18/2024   CO2 25 03/18/2024       Assessment/Plan: Left second toe dry gangrene with ulceration and underlying acute osteomyelitis PAD with left lower extremity critical ischemia Appears stable overnight Appreciate input from vascular surgery-prior MD (Dr. Arlice) had also discussed with podiatry-and then subsequently with family-plans are for conservative treatment with antibiotics/wound care.   Per vascular surgery-if patient develops intractable pain or wound gets infected-patient will require left BKA (deemed not necessary at this point) Given overall stability-lack of sepsis physiology-suspect we can stop IV antibiotics and switch her to Augmentin  for now. Continue aspirin /statin  New onset atrial fibrillation Noted to be in A-fib overnight Rate controlled Long discussion with daughter and son over the phone-explained rationale/risks/benefits of anticoagulation-patient has advanced dementia-had significant fall risk-we all agree that we should not subject her to anticoagulation at this age-family okay with continuing statin.  All understand risk of embolic phenomena including stroke.  CKD stage IIIb Close to baseline  Normocytic anemia Likely secondary to CKD Hb stable-anemia appears to be mild Stable for outpatient follow-up.  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  Dementia with mild delirium Continue  Aricept  Add melatonin/Seroquel  Maintain delirium precautions  Code status:   Code Status: Limited: Do not attempt resuscitation (DNR) -DNR-LIMITED -Do Not  Intubate/DNI    DVT Prophylaxis: enoxaparin  (LOVENOX ) injection 30 mg Start: 03/18/24 1030 SCDs Start: 03/16/24 2054    Family Communication: Daughter Carol-[phone]-and son updated over the phone (phone conference) 11/28   Disposition Plan: Status is: Inpatient Remains inpatient appropriate because: Severity of illness   Planned Discharge Destination:Home   Diet: Diet Order             Diet - low sodium heart healthy           DIET DYS 2 Room service appropriate? Yes; Fluid consistency: Thin  Diet effective now                     Antimicrobial agents: Anti-infectives (From admission, onward)    Start     Dose/Rate Route Frequency Ordered Stop   03/18/24 1800  vancomycin  (VANCOREADY) IVPB 500 mg/100 mL  Status:  Discontinued        500 mg 100 mL/hr over 60 Minutes Intravenous Every 48 hours 03/16/24 2131 03/17/24 0929   03/18/24 1800  vancomycin  (VANCOREADY) IVPB 500 mg/100 mL  Status:  Discontinued        500 mg 100 mL/hr over 60 Minutes Intravenous Every 36 hours 03/17/24 0929 03/18/24 0930   03/18/24 1030  amoxicillin -clavulanate (AUGMENTIN ) 875-125 MG per tablet 1 tablet  Status:  Discontinued        1 tablet Oral Every 12 hours 03/18/24 0930 03/18/24 0941   03/18/24 1030  amoxicillin -clavulanate (AUGMENTIN ) 500-125 MG per tablet 1 tablet        1 tablet Oral Every 12 hours 03/18/24 0941     03/18/24 0000  amoxicillin -clavulanate (AUGMENTIN ) 500-125 MG tablet        1 tablet Oral Every 12 hours 03/18/24 0943 03/25/24 2359   03/17/24 1700  cefTRIAXone  (ROCEPHIN ) 2 g in sodium chloride  0.9 % 100 mL IVPB  Status:  Discontinued        2 g 200 mL/hr over 30 Minutes Intravenous Every 24 hours 03/16/24 2054 03/18/24 0930   03/16/24 1745  cefTRIAXone  (ROCEPHIN ) 2 g in sodium chloride  0.9 % 100 mL IVPB        2 g 200 mL/hr over 30 Minutes Intravenous Once 03/16/24 1736 03/16/24 1825   03/16/24 1745  vancomycin  (VANCOCIN ) IVPB 1000 mg/200 mL premix        1,000  mg 200 mL/hr over 60 Minutes Intravenous  Once 03/16/24 1736 03/16/24 2055        MEDICATIONS: Scheduled Meds:  amoxicillin -clavulanate  1 tablet Oral Q12H   atorvastatin   40 mg Oral Daily   donepezil   10 mg Oral QHS   enoxaparin  (LOVENOX ) injection  30 mg Subcutaneous Q24H   feeding supplement  1 Container Oral TID WC   levothyroxine   75 mcg Oral Q0600   melatonin  5 mg Oral QHS   QUEtiapine   12.5 mg Oral QHS   Continuous Infusions:   PRN Meds:.acetaminophen  **OR** acetaminophen , OLANZapine  zydis   I have personally reviewed following labs and imaging studies  LABORATORY DATA: CBC: Recent Labs  Lab 03/16/24 1521 03/16/24 1542 03/17/24 0245 03/18/24 0249  WBC 10.5  --  10.7* 8.9  NEUTROABS 8.0*  --   --  6.2  HGB 11.8* 12.9 11.9* 10.9*  HCT 38.4 38.0 38.5 34.8*  MCV 84.4  --  83.7 82.5  PLT 303  --  244 277    Basic Metabolic Panel: Recent Labs  Lab 03/16/24 1521 03/16/24 1542 03/17/24 0245 03/18/24 0249  NA 134* 139 140 137  K 3.3* 3.6 4.0 4.1  CL 102 104 105 103  CO2 21*  --  26 25  GLUCOSE 165* 159* 105* 82  BUN 19 25* 10 18  CREATININE 1.27* 1.30* 1.08* 1.26*  CALCIUM  9.0  --  8.7* 8.5*  MG  --   --  1.8  --     GFR: Estimated Creatinine Clearance: 17.8 mL/min (A) (by C-G formula based on SCr of 1.26 mg/dL (H)).  Liver Function Tests: Recent Labs  Lab 03/17/24 0245  AST 23  ALT 14  ALKPHOS 57  BILITOT 0.5  PROT 6.6  ALBUMIN 2.3*   No results for input(s): LIPASE, AMYLASE in the last 168 hours. No results for input(s): AMMONIA in the last 168 hours.  Coagulation Profile: No results for input(s): INR, PROTIME in the last 168 hours.  Cardiac Enzymes: No results for input(s): CKTOTAL, CKMB, CKMBINDEX, TROPONINI in the last 168 hours.  BNP (last 3 results) No results for input(s): PROBNP in the last 8760 hours.  Lipid Profile: No results for input(s): CHOL, HDL, LDLCALC, TRIG, CHOLHDL, LDLDIRECT in  the last 72 hours.  Thyroid  Function Tests: No results for input(s): TSH, T4TOTAL, FREET4, T3FREE, THYROIDAB in the last 72 hours.  Anemia Panel: No results for input(s): VITAMINB12, FOLATE, FERRITIN, TIBC, IRON, RETICCTPCT in the last 72 hours.  Urine analysis:    Component Value Date/Time   COLORURINE STRAW (A) 03/17/2024 0225   APPEARANCEUR HAZY (A) 03/17/2024 0225   LABSPEC 1.011 03/17/2024 0225   PHURINE 7.0 03/17/2024 0225   GLUCOSEU NEGATIVE 03/17/2024 0225   HGBUR SMALL (A) 03/17/2024 0225   BILIRUBINUR NEGATIVE 03/17/2024 0225   BILIRUBINUR negative 06/28/2022 1307   KETONESUR NEGATIVE 03/17/2024 0225   PROTEINUR NEGATIVE 03/17/2024 0225   UROBILINOGEN 0.2 06/28/2022 1307   NITRITE NEGATIVE 03/17/2024 0225   LEUKOCYTESUR LARGE (A) 03/17/2024 0225    Sepsis Labs: Lactic Acid, Venous    Component Value Date/Time   LATICACIDVEN 1.1 03/16/2024 1733    MICROBIOLOGY: Recent Results (from the past 240 hours)  Culture, blood (single)     Status: None (Preliminary result)   Collection Time: 03/16/24  5:36 PM   Specimen: BLOOD RIGHT HAND  Result Value Ref Range Status   Specimen Description BLOOD RIGHT HAND  Final   Special Requests   Final    BOTTLES DRAWN AEROBIC AND ANAEROBIC Blood Culture results may not be optimal due to an inadequate volume of blood received in culture bottles   Culture   Final    NO GROWTH 2 DAYS Performed at [facility], [address] [place], [place] [address]    Report Status PENDING  Incomplete    RADIOLOGY STUDIES/RESULTS: No results found.    LOS: 3 days   Donalda Applebaum, MD  [facility]    To contact the attending provider between 7A-7P or the covering provider during after hours 7P-7A, please log into the web site [email or web address] and access using universal [place] password for that web site. If you do not have the password, please call the hospital operator.  03/19/2024, 10:04  AM

## 2024-03-19 NOTE — TOC Initial Note (Addendum)
 Transition of Care [facility]) - Initial/Assessment Note    Patient Details  Name: Dawn Conner MRN: [id number] Date of Birth: 03-Aug-1927  Transition [facility] Care [facility]) CM/SW Contact:    Bernardino Dean, LCSWA Phone Number: 03/19/2024, 9:05 AM  Clinical Narrative:                 Per chart, patient from [facility]. FL2 Completed and referral sent for review. Patient meets 3mn inpt Medicare criteria (inpt admission 11/25). Discharge plan currently return to SNF at [facility].   11:37: Called and spoke with patient's daughter, Niels. Niels agreeable for SNF discharge today to [facility]. Per attending direction, patient stable for discharge back to SNF. [facility] able to accept today. Await d/c orders. Family hoping to make independent transport arrangements. Have had issues with insurance covering ambulances.   Expected Discharge Plan: Skilled Nursing Facility Barriers to Discharge: Continued Medical Work up, No SNF bed   Patient Goals and CMS Choice Patient states their goals for this hospitalization and ongoing recovery are:: Goal for return to SNF CMS Medicare.gov Compare Post Acute Care list provided to:: Other (Comment Required) (N/A) Choice offered to / list presented to : NA  ownership interest in [facility]provided to:: Parent NA    Expected Discharge Plan and Services     Post Acute Care Choice: Skilled Nursing Facility Living arrangements for the past 2 months: Skilled Nursing Facility                                      Prior Living Arrangements/Services Living arrangements for the past 2 months: Skilled Nursing Facility   Patient language and need for interpreter reviewed:: Yes Do you feel safe going back to the place where you live?: Yes      Need for Family Participation in Patient Care: Yes (Comment) Care giver support system in place?: Yes (comment)   Criminal Activity/Legal Involvement Pertinent to Current Situation/Hospitalization: No - Comment as  needed  Activities of Daily Living      Permission Sought/Granted                  Emotional Assessment   Attitude/Demeanor/Rapport: Unable to Assess Affect (typically observed): Unable to Assess   Alcohol / Substance Use: Not Applicable Psych Involvement: No (comment)  Admission diagnosis:  Cellulitis [L03.90] Cellulitis of left lower extremity [L03.116] Patient Active Problem List   Diagnosis Date Noted   Chronic limb-threatening ischemia (HCC) 03/16/2024   Severe peripheral arterial disease 03/16/2024   Normocytic anemia 03/16/2024   Hyponatremia 03/16/2024   Toe infection 03/16/2024   Malnutrition of moderate degree 02/24/2023   UTI (urinary tract infection) 02/23/2023   Closed displaced fracture of pelvis (HCC) 02/21/2023   Subdural hematoma (HCC) 02/21/2023   Scalp wound 02/21/2023   AAA (abdominal aortic aneurysm) 02/21/2023   Pancreatic insufficiency 02/21/2023   Chronic kidney disease, stage 3b (HCC) 02/21/2023   Localized swelling of right lower extremity 02/21/2023   Increased bowel frequency 02/10/2023   Syncope and collapse 02/07/2023   Dementia without behavioral disturbance (HCC) 02/07/2023   Generalized abdominal pain 10/07/2022   Rectal bleeding 07/08/2022   Tinea 07/08/2022   Fall at home, initial encounter 03/19/2022   Perianal irritation 12/20/2021   Prediabetes 09/18/2021   SIRS (systemic inflammatory response syndrome) (HCC) 09/17/2021   Failure to thrive in adult 09/17/2021   Fever 09/17/2021   Leukocytosis 09/17/2021  Generalized weakness 09/17/2021   Chronic confusional state 09/17/2021   Hyperglycemia 09/17/2021   Alternating constipation and diarrhea 01/22/2021   Vascular dementia (HCC) 10/17/2019   Diarrhea 05/19/2019   Essential hypertension 11/09/2018   Osteoporosis 11/09/2018   Acquired hypothyroidism 11/09/2018   Hyperlipidemia 11/09/2018   Chronic diarrhea 11/09/2018   PCP:  Lanell Jacobsen, NP Pharmacy:   [facility] [place], [place] - [phone] [address] [place] [address] Phone: [phone] Fax: [phone]     Social Drivers of Health (SDOH) Social History: SDOH Screenings   Food Insecurity: Patient Unable To Answer (03/18/2024)  Housing: Unknown (03/18/2024)  Transportation Needs: Patient Unable To Answer (03/18/2024)  Utilities: Patient Unable To Answer (03/18/2024)  Alcohol Screen: Low Risk  (05/03/2022)  Depression (PHQ2-9): Low Risk  (05/03/2022)  Financial Resource Strain: Low Risk  (05/03/2022)  Physical Activity: Insufficiently Active (05/03/2022)  Social Connections: Unknown (03/18/2024)  Stress: No Stress Concern Present (05/03/2022)  Tobacco Use: Low Risk  (02/12/2024)   SDOH Interventions:     Readmission Risk Interventions    02/24/2023   10:29 AM  Readmission Risk Prevention Plan  Transportation Screening Complete  PCP or Specialist Appt within 5-7 Days Complete  Home Care Screening --

## 2024-03-19 NOTE — Evaluation (Signed)
 Physical Therapy Evaluation Patient Details Name: Chirsty Armistead MRN: [id number] DOB: [birth year] Today's Date: 03/19/2024  History of Present Illness  Pt is a 89 y.o F presenting 11/25 with complaints of cold L foot. 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 Med hx signifcant for dementia, HTN, HLD, hypothyroidism, CKD-IIIb, osteoporosis, pancreatic insufficiency, AAA, pelvic fx, and subdural hematoma November 2024.  Clinical Impression  PTA, pt living at [facility] skill nursing facility. Pt unreliable historian due to history of dementia, and unsure of prior level of function. Currently, pt is pleasant but nonsensical and A&Ox1 (to self). She is modA +2 for bed mobility and transfers, and tends to lean posteriorly in sitting and standing. Demonstrates difficulty following one step commands, and opted for stand pivot transfer to chair as pt unable to advance L LE to take steps to pivot without physical assist. Describes L LE pain in standing. Recommending post-acute rehab <3hrs/day to improve functional mobility tolerance, strength, and endurance. Acute PT to follow.       If plan is discharge home, recommend the following: Two people to help with walking and/or transfers;A lot of help with bathing/dressing/bathroom;Assistance with cooking/housework;Assistance with feeding;Direct supervision/assist for medications management;Direct supervision/assist for financial management;Assist for transportation;Supervision due to cognitive status   Can travel by private vehicle   No    Equipment Recommendations None recommended by PT  Recommendations for Other Services  OT consult    Functional Status Assessment Patient has had a recent decline in their functional status and demonstrates the ability to make significant improvements in function in a reasonable and predictable amount of time.     Precautions / Restrictions Precautions Precautions: Fall;Other  (comment) Recall of Precautions/Restrictions: Impaired Precaution/Restrictions Comments: Hx of dementia Restrictions Weight Bearing Restrictions Per Provider Order: No      Mobility  Bed Mobility Overal bed mobility: Needs Assistance Bed Mobility: Supine to Sit     Supine to sit: Mod assist, +2 for physical assistance     General bed mobility comments: Requiring modA +2 for initiation and management of LE and trunk during supine to sit.    Transfers Overall transfer level: Needs assistance Equipment used: None Transfers: Sit to/from Stand, Bed to chair/wheelchair/BSC Sit to Stand: Mod assist, +2 physical assistance Stand pivot transfers: Mod assist, +2 physical assistance         General transfer comment: Pt with poor intiation and motor planning during sit to stand, and has tendency to adduct B LE and lean posteriorly. Describes L LE pain with standing and pivoting, and demonstrates difficulty advancing B LE to take steps to pivot to chair. Required modA face to face assist to complete.    Ambulation/Gait                  Stairs            Wheelchair Mobility     Tilt Bed    Modified Rankin (Stroke Patients Only)       Balance Overall balance assessment: Needs assistance Sitting-balance support: Single extremity supported, Feet supported Sitting balance-Leahy Scale: Poor Sitting balance - Comments: Required CGA-modA to maintain sitting balance. Tendency to lean posteriorly without ability to correct without assist Postural control: Posterior lean Standing balance support: Bilateral upper extremity supported, Reliant on assistive device for balance, During functional activity Standing balance-Leahy Scale: Poor Standing balance comment: Reliant on external support  Pertinent Vitals/Pain Pain Assessment Pain Assessment: Faces Faces Pain Scale: Hurts even more Pain Descriptors / Indicators: Grimacing Pain  Intervention(s): Limited activity within patient's tolerance, Monitored during session    Home Living                     Additional Comments: Pt comes to hospital from [facility] and plans to return following d/c. Pt unreliable historian and family unavailable to obtain hx.    Prior Function Prior Level of Function : Needs assist                     Extremity/Trunk Assessment   Upper Extremity Assessment Upper Extremity Assessment: Defer to OT evaluation    Lower Extremity Assessment Lower Extremity Assessment: Generalized weakness    Cervical / Trunk Assessment Cervical / Trunk Assessment: Kyphotic  Communication   Communication Communication: Impaired Factors Affecting Communication: Difficulty expressing self;Reduced clarity of speech    Cognition Arousal: Alert Behavior During Therapy: Flat affect   PT - Cognitive impairments: History of cognitive impairments, Orientation, Awareness, Initiation, Attention, Memory, Sequencing, Safety/Judgement   Orientation impairments: Place, Time, Situation                   PT - Cognition Comments: Unsure of name, but after repeat questioning and inc time, pt able to state full name. Unsure of situation, time, or place. Pt with difficulty following direction and answering both multiple choice and yes/no questioning. Pt was generally nonsensical throughout session Following commands: Impaired Following commands impaired: Follows one step commands inconsistently, Follows one step commands with increased time     Cueing Cueing Techniques: Verbal cues, Gestural cues, Tactile cues     General Comments General comments (skin integrity, edema, etc.): Pt pleasant but nonsensical throughout. Has difficulty following one step commands.    Exercises     Assessment/Plan    PT Assessment Patient needs continued PT services  PT Problem List Decreased strength;Decreased range of motion;Decreased activity  tolerance;Decreased balance;Decreased mobility;Decreased coordination;Decreased cognition;Decreased knowledge of use of DME;Decreased safety awareness;Decreased knowledge of precautions;Pain       PT Treatment Interventions DME instruction;Gait training;Functional mobility training;Therapeutic activities;Therapeutic exercise;Balance training;Cognitive remediation;Neuromuscular re-education;Patient/family education;Wheelchair mobility training;Manual techniques;Modalities    PT Goals (Current goals can be found in the Care Plan section)  Acute Rehab PT Goals Patient Stated Goal: unable to state PT Goal Formulation: With patient Time For Goal Achievement: 04/02/24 Potential to Achieve Goals: Fair    Frequency Min 2X/week     Co-evaluation               AM-PAC PT 6 Clicks Mobility  Outcome Measure Help needed turning from your back to your side while in a flat bed without using bedrails?: A Lot Help needed moving from lying on your back to sitting on the side of a flat bed without using bedrails?: A Lot Help needed moving to and from a bed to a chair (including a wheelchair)?: Total Help needed standing up from a chair using your arms (e.g., wheelchair or bedside chair)?: Total Help needed to walk in hospital room?: Total Help needed climbing 3-5 steps with a railing? : Total 6 Click Score: 8    End of Session Equipment Utilized During Treatment: Gait belt Activity Tolerance: Patient tolerated treatment well Patient left: in chair;with call bell/phone within reach;with chair alarm set Nurse Communication: Mobility status PT Visit Diagnosis: Unsteadiness on feet (R26.81);Other abnormalities of gait and mobility (R26.89);Muscle weakness (generalized) (M62.81);Difficulty  in walking, not elsewhere classified (R26.2)    Time: 9192-9159 PT Time Calculation (min) (ACUTE ONLY): 33 min   Charges:   PT Evaluation $PT Eval Low Complexity: 1 Low   PT General Charges $$ ACUTE PT  VISIT: 1 Visit         Karyssa Amaral, SPT   Samar Dass 03/19/2024, 10:32 AM

## 2024-03-19 NOTE — Significant Event (Signed)
 Patient noted to have abnormal heart rhythm showing A-fib rate controlled.  EKG confirms.  Will discuss with morning team and if patient would be a candidate for anticoagulation.  Continue to monitor on telemetry.  Redia Cleaver.

## 2024-03-19 NOTE — Plan of Care (Signed)

## 2024-03-19 NOTE — TOC Transition Note (Signed)
 Transition of Care [facility]) - Discharge Note   Patient Details  Name: Dawn Conner MRN: [id number] Date of Birth: 1928/04/16  Transition [facility]) CM/SW Contact:  Inocente GORMAN Kindle, LCSW Phone Number: 03/19/2024, 12:11 PM   Clinical Narrative:    Patient will DC to: [facility] Anticipated DC date: 03/19/24 Family notified: Daughter, Health And Safety Inspector by: Niels via car   Per MD patient ready for DC to [facility]. RN to call report prior to discharge [phone] room 206B). RN, patient, patient's family, and facility notified of DC. Discharge Summary and FL2 sent to facility.   CSW will sign off for now as social work intervention is no longer needed. Please consult us  again if new needs arise.     Final next level of care: Skilled Nursing Facility Barriers to Discharge: Barriers Resolved   Patient Goals and CMS Choice Patient states their goals for this hospitalization and ongoing recovery are:: Goal for return to SNF CMS Medicare.gov Compare Post Acute Care list provided to:: Other (Comment Required) (N/A) Choice offered to / list presented to : NA [place] ownership interest in [facility].provided to:: Parent NA    Discharge Placement   Existing PASRR number confirmed : 03/19/24          Patient chooses bed at: [facility] Patient to be transferred to facility by: ptar Name of family member notified: DAUGHTER Patient and family notified of of transfer: 03/19/24  Discharge Plan and Services Additional resources added to the After Visit Summary for       Post Acute Care Choice: Skilled Nursing Facility                               Social Drivers of Health (SDOH) Interventions SDOH Screenings   Food Insecurity: Patient Unable To Answer (03/18/2024)  Housing: Unknown (03/18/2024)  Transportation Needs: Patient Unable To Answer (03/18/2024)  Utilities: Patient Unable To Answer (03/18/2024)  Alcohol Screen: Low Risk  (05/03/2022)   Depression (PHQ2-9): Low Risk  (05/03/2022)  Financial Resource Strain: Low Risk  (05/03/2022)  Physical Activity: Insufficiently Active (05/03/2022)  Social Connections: Unknown (03/18/2024)  Stress: No Stress Concern Present (05/03/2022)  Tobacco Use: Low Risk  (02/12/2024)     Readmission Risk Interventions    02/24/2023   10:29 AM  Readmission Risk Prevention Plan  Transportation Screening Complete  PCP or Specialist Appt within 5-7 Days Complete  Home Care Screening --

## 2024-03-19 NOTE — Evaluation (Signed)
 Occupational Therapy Evaluation Patient Details Name: Dawn Conner MRN: [id number] DOB: 03/01/1928 Today's Date: 03/19/2024   History of Present Illness   Pt is a 89 y.o F presenting 11/25 with complaints of cold L foot. 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 Med hx signifcant for dementia, HTN, HLD, hypothyroidism, CKD-IIIb, osteoporosis, pancreatic insufficiency, AAA, pelvic fx, and subdural hematoma November 2024.     Clinical Impressions Patient admitted for the diagnosis above.  Patient resides at a local facility and receives assist as needed from staff for ADL completion.  No significant OT need exist in the acute setting, OT will defer to staff at next level of care for prior level of supports.  No post acute OT is anticipated.       If plan is discharge home, recommend the following:   Assist for transportation;Assistance with cooking/housework;A lot of help with bathing/dressing/bathroom;Direct supervision/assist for medications management;Supervision due to cognitive status;Direct supervision/assist for financial management     Functional Status Assessment   Patient has had a recent decline in their functional status and demonstrates the ability to make significant improvements in function in a reasonable and predictable amount of time.     Equipment Recommendations   None recommended by OT     Recommendations for Other Services         Precautions/Restrictions   Precautions Precautions: Fall Precaution/Restrictions Comments: Hx of dementia Restrictions Weight Bearing Restrictions Per Provider Order: No     Mobility Bed Mobility Overal bed mobility: Needs Assistance Bed Mobility: Rolling, Sit to Supine Rolling: Min assist     Sit to supine: Mod assist        Transfers Overall transfer level: Needs assistance Equipment used: None Transfers: Sit to/from Stand, Bed to chair/wheelchair/BSC Sit to  Stand: Min assist     Step pivot transfers: Min assist, Mod assist            Balance Overall balance assessment: Needs assistance Sitting-balance support: Feet supported Sitting balance-Leahy Scale: Fair     Standing balance support: Bilateral upper extremity supported Standing balance-Leahy Scale: Poor                             ADL either performed or assessed with clinical judgement   ADL Overall ADL's : At baseline                                             Vision   Vision Assessment?: No apparent visual deficits     Perception Perception: Not tested       Praxis Praxis: Not tested       Pertinent Vitals/Pain Pain Assessment Pain Assessment: Faces Faces Pain Scale: Hurts little more Pain Location: L 2nd toe during stand Pain Descriptors / Indicators: Tender Pain Intervention(s): Premedicated before session     Extremity/Trunk Assessment Upper Extremity Assessment Upper Extremity Assessment: Generalized weakness   Lower Extremity Assessment Lower Extremity Assessment: Defer to PT evaluation   Cervical / Trunk Assessment Cervical / Trunk Assessment: Kyphotic   Communication Communication Communication: Impaired Factors Affecting Communication: Difficulty expressing self   Cognition Arousal: Alert Behavior During Therapy: Flat affect Cognition: History of cognitive impairments  Following commands: Impaired       Cueing  General Comments      Pt pleasant but nonsensical throughout. Has difficulty following one step commands.   Exercises     Shoulder Instructions      Home Living Family/patient expects to be discharged to:: Skilled nursing facility                                 Additional Comments: Pt comes to hospital from [facility] and plans to return following d/c. Pt unreliable historian and family unavailable to obtain hx.      Prior  Functioning/Environment Prior Level of Function : Needs assist                    OT Problem List: Decreased strength;Decreased activity tolerance;Impaired balance (sitting and/or standing);Pain;Decreased cognition;Decreased safety awareness   OT Treatment/Interventions:        OT Goals(Current goals can be found in the care plan section)   Acute Rehab OT Goals Patient Stated Goal: None stated OT Goal Formulation: With patient Time For Goal Achievement: 03/26/24 Potential to Achieve Goals: Good   OT Frequency:       Co-evaluation              AM-PAC OT 6 Clicks Daily Activity     Outcome Measure Help from another person eating meals?: A Lot Help from another person taking care of personal grooming?: A Lot Help from another person toileting, which includes using toliet, bedpan, or urinal?: A Lot Help from another person bathing (including washing, rinsing, drying)?: A Lot Help from another person to put on and taking off regular upper body clothing?: A Lot Help from another person to put on and taking off regular lower body clothing?: A Lot 6 Click Score: 12   End of Session Nurse Communication: Mobility status  Activity Tolerance: Patient tolerated treatment well Patient left: in bed;with call bell/phone within reach;with nursing/sitter in room;with family/visitor present  OT Visit Diagnosis: Unsteadiness on feet (R26.81);Pain Pain - Right/Left: Left Pain - part of body: Ankle and joints of foot                Time: 8955-8896 OT Time Calculation (min): 19 min Charges:  OT General Charges $OT Visit: 1 Visit OT Evaluation $OT Eval Moderate Complexity: 1 Mod  03/19/2024  RP, OTR/L  Acute Rehabilitation Services  Office:  [phone]   Charlie JONETTA Halsted 03/19/2024, 11:28 AM

## 2024-03-19 NOTE — Discharge Summary (Signed)
 PATIENT DETAILS Name: Dawn Conner Age: 89 y.o. Sex: female Date of Birth: Nov 04, 1927 MRN: [id number]. Admitting Physician: Editha Ram, MD ERE:Ezmxpwd, Morna, NP  Admit Date: 03/16/2024 Discharge date: 03/19/2024  Recommendations for Outpatient Follow-up:  Follow up with PCP in 1-2 weeks Please obtain CMP/CBC in one week Palliative care care follow-up at SNF  Admitted From:  SNF  Disposition: Skilled nursing facility   Discharge Condition: fair  CODE STATUS:   Code Status: Limited: Do not attempt resuscitation (DNR) -DNR-LIMITED -Do Not Intubate/DNI    Diet recommendation:  Diet Order             Diet - low sodium heart healthy           DIET DYS 2 Room service appropriate? Yes; Fluid consistency: Thin  Diet effective now                    Brief Summary: Patient is a 89 y.o.  female with history of dementia, HLD, hypothyroidism-presented with cold left foot and ulcer on the second toe of her left foot.   Significant events: 11/25>> admit to [facility]   Significant studies: 11/25>> CT angio lower extremity: Multilevel PAD with areas of high-grade stenosis in right SFA, occlusion of right popliteal artery, high-grade stenosis of mid SFA, occlusion of popliteal artery at origin. 11/25>> MRI left foot: Osteomyelitis of middle phalanx of second toe.   Significant microbiology data: 1125>> blood culture: No growth   Procedures: None   Consults: Vascular surgery  Brief Hospital Course: Left second toe dry gangrene with ulceration and underlying acute osteomyelitis PAD with left lower extremity critical ischemia Appears stable overnight Appreciate input from vascular surgery-prior MD (Dr. Arlice) had also discussed with podiatry-and then subsequently with family-plans are for conservative treatment with antibiotics/wound care.   Per vascular surgery-if patient develops intractable pain or wound gets infected-patient will require left BKA (deemed not  necessary at this point) Given overall stability-lack of sepsis physiology-No longer on IV antibiotics has been switched Augmentin  for now. Continue aspirin /statin   New onset atrial fibrillation Noted to be in A-fib overnight Rate controlled Long discussion with daughter and son over the phone-explained rationale/risks/benefits of anticoagulation-patient has advanced dementia-has significant fall risk-we all agree that we should not subject her to anticoagulation at this age-family okay with continuing ASA.  All understand risk of embolic phenomena including stroke.   CKD stage IIIb Close to baseline   Normocytic anemia Likely secondary to CKD Hb stable-anemia appears to be mild Stable for outpatient follow-up.   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   Dementia with mild delirium Continue Aricept  Maintain delirium precautions  Discharge Diagnoses:  Principal Problem:   Toe infection Active Problems:   Chronic limb-threatening ischemia (HCC)   Severe peripheral arterial disease   Normocytic anemia   Hyponatremia   Discharge Instructions:  Activity:  As tolerated with Full fall precautions use walker/cane & assistance as needed  Discharge Instructions     Call MD for:  extreme fatigue   Complete by: As directed    Call MD for:  persistant dizziness or light-headedness   Complete by: As directed  Call MD for:  persistant nausea and vomiting   Complete by: As directed    Diet - low sodium heart healthy   Complete by: As  directed    Discharge instructions   Complete by: As directed    Follow with Primary MD  Lanell Jacobsen, NP in 1-2 weeks  Please get a complete blood count and chemistry panel checked by your Primary MD at your next visit, and again as instructed by your Primary MD.  Get Medicines reviewed and adjusted: Please take all your medications with you for your next visit with your Primary MD  Laboratory/radiological data: Please request your Primary MD to go over all hospital tests and procedure/radiological results at the follow up, please ask your Primary MD to get all Hospital records sent to his/her office.  In some cases, they will be blood work, cultures and biopsy results pending at the time of your discharge. Please request that your primary care M.D. follows up on these results.  Also Note the following: If you experience worsening of your admission symptoms, develop shortness of breath, life threatening emergency, suicidal or homicidal thoughts you must seek medical attention immediately by calling 911 or calling your MD immediately  if symptoms less severe.  You must read complete instructions/literature along with all the possible adverse reactions/side effects for all the Medicines you take and that have been prescribed to you. Take any new Medicines after you have completely understood and accpet all the possible adverse reactions/side effects.   Do not drive when taking Pain medications or sleeping medications (Benzodaizepines)  Do not take more than prescribed Pain, Sleep and Anxiety Medications. It is not advisable to combine anxiety,sleep and pain medications without talking with your primary care practitioner  Special Instructions: If you have smoked or chewed Tobacco  in the last 2 yrs please stop smoking, stop any regular Alcohol  and or any Recreational drug use.  Wear Seat belts while driving.  Please note: You were cared for by a hospitalist during your hospital stay.  Once you are discharged, your primary care physician will handle any further medical issues. Please note that NO REFILLS for any discharge medications will be authorized once you are discharged, as it is imperative that you return to your primary care physician (or establish a relationship with a primary care physician if you do not have one) for your post hospital discharge needs so that they can reassess your need for medications and monitor your lab values.   Discharge wound care:   Complete by: As directed    Wound care  Daily      Comments: Paint left 2nd digit wound with Betadine, apply Betadine soaked gauze to wound bed and secure with dry gauze and tape   Increase activity slowly   Complete by: As directed       Allergies as of 03/19/2024       Reactions   Lactose Intolerance (gi) Nausea And Vomiting   Sulfa Antibiotics         Medication List     STOP taking these medications    doxycycline  100 MG capsule Commonly known as: VIBRAMYCIN    oxyCODONE  5 MG immediate release tablet Commonly known as: Oxy IR/ROXICODONE        TAKE these medications    acetaminophen  325 MG tablet Commonly known as: TYLENOL  Take 650 mg by mouth every 4 (four) hours as needed for moderate pain (pain score 4-6).   amoxicillin -clavulanate 500-125 MG tablet Commonly known as:  AUGMENTIN  Take 1 tablet by mouth every 12 (twelve) hours for 7 days.   aspirin  325 MG tablet Take 325 mg by mouth daily.   atorvastatin  40 MG tablet Commonly known as: LIPITOR Take 40 mg by mouth daily.   calcium  carbonate 1500 (600 Ca) MG Tabs tablet Commonly known as: OSCAL Take 2 tablets by mouth at bedtime.   calcium  carbonate 500 MG chewable tablet Commonly known as: TUMS - dosed in mg elemental calcium  Chew 1 tablet by mouth See admin instructions. Before meals   cholecalciferol 25 MCG (1000 UNIT) tablet Commonly known as: VITAMIN D3 Take 1,000 Units by mouth daily.   donepezil  10 MG  tablet Commonly known as: ARICEPT  Take 1 tablet (10 mg total) by mouth at bedtime.   lactose free nutrition Liqd Take 237 mLs by mouth 2 (two) times daily between meals.   Ensure Active High Protein Liqd Take 1 Can by mouth in the morning, at noon, in the evening, and at bedtime.   ferrous sulfate 324 MG Tbec Take 324 mg by mouth daily with breakfast.   levothyroxine  75 MCG tablet Commonly known as: SYNTHROID  TAKE 1 TABLET BY MOUTH BEFORE BREAKFAST   loperamide  2 MG tablet Commonly known as: IMODIUM  A-D Take 2 mg by mouth every 6 (six) hours as needed for diarrhea or loose stools.   melatonin 5 MG Tabs Take 1 tablet (5 mg total) by mouth at bedtime.   MULTIVITAMIN ADULTS 50+ PO Take 1 tablet by mouth daily.   OLANZapine  zydis 5 MG disintegrating tablet Commonly known as: ZYPREXA  Take 1 tablet (5 mg total) by mouth 2 (two) times daily as needed (agiation).   pantoprazole 40 MG tablet Commonly known as: PROTONIX Take 1 tablet (40 mg total) by mouth daily at 12 noon.   Pro-Stat AWC Liqd Take 30 mLs by mouth 2 (two) times daily between meals.   psyllium 0.52 g capsule Commonly known as: REGULOID Take 0.52 g by mouth daily.   saccharomyces boulardii 250 MG capsule Commonly known as: FLORASTOR Take 250 mg by mouth 2 (two) times daily.   Zinc Oxide 12 % Crea Apply topically 2 (two) times daily.               Discharge Care Instructions  (From admission, onward)           Start     Ordered   03/18/24 0000  Discharge wound care:       Comments: Wound care  Daily      Comments: Paint left 2nd digit wound with Betadine, apply Betadine soaked gauze to wound bed and secure with dry gauze and tape   03/18/24 0943            Follow-up Information     Lanell Jacobsen, NP. Schedule an appointment as soon as possible for a visit in 1 week(s).   Specialty: Hematology and Oncology Contact information: [address] [place]  [address] [phone]                Allergies  Allergen Reactions   Lactose Intolerance (Gi) Nausea And Vomiting   Sulfa Antibiotics      Other Procedures/Studies: MR FOOT LEFT WO CONTRAST Result Date: 03/17/2024 EXAM: MRI of the left Foot without contrast. 03/16/2024 11:25:17 PM TECHNIQUE: Multiplanar multisequence MRI of the left foot was performed without the administration of intravenous contrast. COMPARISON: Radiographs 03/16/2024 CLINICAL HISTORY: Foot swelling, diabetic, osteomyelitis suspected, xray done. FINDINGS: LIMITATIONS/ARTIFACTS: Motion artifact is  present, reducing diagnostic sensitivity and specificity. LISFRANC JOINT: Visualized Lisfranc ligament is intact. No significant Lisfranc interval widening or significant periligamentous edema. BONE MARROW: Low grade marrow edema in the distal phalanx second toe and questionably in the middle phalanx second toe, suspicious for osteomyelitis. GREATER AND LESSER MTP JOINTS: Degenerative findings of the 1st metatarsophalangeal joint. No significant joint effusion or osseous erosions. Normal alignment. SOFT TISSUES: Plantar subcutaneous edema along the ball of the foot. TENDONS: Visualized flexor and extensor tendons are intact without tenosynovitis. IMPRESSION: 1. Low grade marrow edema in the distal phalanx of the second toe and questionably in the middle phalanx of the second toe, suspicious for osteomyelitis. 2. Plantar subcutaneous edema along the ball of the foot. 3. Degenerative changes of the first metatarsophalangeal joint. 4. Motion artifact reduces diagnostic sensitivity and specificity. Electronically signed by: Ryan Salvage MD 03/17/2024 09:18 AM EST RP Workstation: [id number]   CT ANGIO LOWER EXT BILAT W &/OR WO CONTRAST Result Date: 03/16/2024 EXAM: CTA BILATERAL LOWER EXTREMITY 03/16/2024 04:46:59 PM TECHNIQUE: Contrast-enhanced computed tomography angiography of the lower extremity was performed with multiplanar  reconstructions. Maximum intensity projection images were created on a separate workstation and reviewed. Automated exposure control, iterative reconstruction, and/or weight based adjustment of the mA/kV was utilized to reduce the radiation dose to as low as reasonably achievable. COMPARISON: CT pelvis 02/12/2024 and CT abdomen and pelvis 10/22/2022. CLINICAL HISTORY: Claudication or leg ischemia. FINDINGS: ARTERIAL: AORTA: The visualized portion of the lower abdominal aorta demonstrates an infrarenal abdominal aortic aneurysm measuring 3.7 cm AP diameter, unchanged since prior studies. There is mural thrombus and circumferential mural calcification. Diffuse vascular calcifications. LEFT COMMON ILIAC ARTERY: Diffuse calcification. No focal stenosis or occlusion. LEFT EXTERNAL ILIAC ARTERY: Patent with diffuse calcification. RIGHT COMMON ILIAC ARTERY: Diffuse calcification. No focal stenosis or occlusion. RIGHT EXTERNAL ILIAC ARTERY: Patent with diffuse calcification. LEFT INTERNAL ILIAC ARTERY: Diffuse disease although it is patent. RIGHT INTERNAL ILIAC ARTERY: Proximal occlusion with long segment thrombosis and distal reconstitution. LEFT COMMON FEMORAL ARTERY: Patent. LEFT DEEP FEMORAL ARTERY: Patent. LEFT SUPERFICIAL FEMORAL ARTERY: Diffuse disease throughout with high-grade stenosis of the mid/distal superficial femoral artery up to 90% diameter reduction. LEFT POPLITEAL ARTERY: Occlusion of the origin without flow identified. LEFT TIBIOPERONEAL TRUNK: No significant stenosis or vessel occlusion. LEFT ANTERIOR TIBIAL ARTERY: Diffuse disease with multiple areas of high-grade stenosis. Segmental flow is demonstrated with 2-vessel flow to the left ankle via diseased anterior and posterior tibial arteries. LEFT PERONEAL ARTERY: Diffuse disease with multiple areas of high-grade stenosis. Segmental flow is demonstrated. LEFT POSTERIOR TIBIAL ARTERY: Diffuse disease with multiple areas of high-grade stenosis. Segmental  flow is demonstrated with 2-vessel flow to the left ankle via diseased anterior and posterior tibial arteries. RIGHT COMMON FEMORAL ARTERY: Patent. RIGHT SUPERFICIAL FEMORAL ARTERY: Focal areas of high-grade stenoses are demonstrated in the mid to distal superficial femoral artery representing up to 80% diameter reduction. RIGHT DEEP FEMORAL ARTERY: Multiple areas of focal stenosis with absence of flow in the distal deep femoral artery. RIGHT POPLITEAL ARTERY: Occlusion of the origin with no flow demonstrated. Diffuse calcific stenosis. RIGHT TIBIOPERONEAL TRUNK: No significant stenosis or vessel occlusion. RIGHT ANTERIOR TIBIAL ARTERY: Diffuse disease with focal areas of segmental flow and multiple areas of high-grade stenosis. Limited 2-vessel runoff via diseased anterior and posterior tibial arteries. RIGHT PERONEAL ARTERY: Diffuse disease with focal areas of segmental flow and multiple areas of high-grade stenosis. Limited 2-vessel runoff via diseased anterior and posterior tibial arteries. RIGHT POSTERIOR TIBIAL ARTERY: Diffuse disease  with focal areas of segmental flow and multiple areas of high-grade stenosis. Limited 2-vessel runoff via diseased anterior and posterior tibial arteries. BONES AND SOFT TISSUES: Surgical absence of the gallbladder. Multilocular cystic mass suggested in the right upper quadrant measuring 4.4 x 8.5 cm diameter. No significant change since prior study. This likely arises from the head of the pancreas. The bladder wall is diffusely thickened. This may indicate cystitis. Correlate with urinalysis. Old fracture deformities of the left superior and inferior pubic rami. Degenerative changes in the lower lumbar spine. IMPRESSION: 1. Diffuse multilevel peripheral arterial disease with multiple areas of high-grade stenosis in the right superficial femoral artery (up to 80% diameter reduction) and occlusion of the right popliteal artery origin, with limited two-vessel runoff via diseased  anterior and posterior tibial arteries. 2. Diffuse multilevel peripheral arterial disease throughout the left lower extremity with high-grade stenosis of the mid/distal superficial femoral artery (up to 90% diameter reduction) and occlusion of the popliteal artery origin, with segmental flow and two-vessel runoff to the ankle via diseased anterior and posterior tibial arteries. 3. Proximal occlusion of the right internal iliac artery with long-segment thrombosis and distal reconstitution, with diffuse disease of the left internal iliac artery that remains patent. 4. Infrarenal abdominal aortic aneurysm measuring 3.7 cm AP diameter with mural thrombus and circumferential mural calcification, unchanged from prior studies. 5. 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 6. Diffuse bladder wall thickening, possibly indicating cystitis. Electronically signed by: Elsie Gravely MD 03/16/2024 05:16 PM EST RP Workstation: [id number]   DG Foot 2 Views Left Result Date: 03/16/2024 EXAM: 2 VIEW(S) XRAY OF THE LEFT FOOT 03/16/2024 03:54:00 PM COMPARISON: None available. CLINICAL HISTORY: osteo 2nd toe? FINDINGS: BONES AND JOINTS: Osseous demineralization. Assessment of second digit limited by positioning. First MTP degenerative changes. No acute fracture. No joint dislocation. SOFT TISSUES: Second toe soft tissue swelling with possible gas. Vascular calcifications. IMPRESSION: 1. Assessment of the second digit limited by positioning, generalized demineralization of the foot without frank osseous destructive change of the second toe. Emphasis views of the second toe versus MRI could be considered for further assessment depending on the level of concern 2. Second toe soft tissue swelling with possible gas artifact at the distal digit . 3. First MTP degenerative changes. Electronically signed by: Luke Bun MD  03/16/2024 04:41 PM EST RP Workstation: [id number]     TODAY-DAY OF DISCHARGE:  Subjective:   Dawn Conner today remains pleasantly confused.  Objective:   Blood pressure 105/61, pulse 88, temperature 97.7 F (36.5 C), temperature source Axillary, resp. rate 17, height 4' 11 (1.499 m), weight 41.3 kg, SpO2 97%.  Intake/Output Summary (Last 24 hours) at 03/19/2024 1152 Last data filed at 03/19/2024 0531 Gross per 24 hour  Intake 415 ml  Output 950 ml  Net -535 ml   Filed Weights   03/16/24 1527  Weight: 41.3 kg    Exam: Awake  No new F.N deficits, Normal affect [place].AT,PERRAL Supple Neck,No JVD, No cervical lymphadenopathy appriciated.  Symmetrical Chest wall movement, Good air movement bilaterally, CTAB RRR,No Gallops,Rubs or new Murmurs, No Parasternal Heave +ve B.Sounds, Abd Soft, Non tender, No organomegaly appriciated, No rebound -guarding or rigidity. No Cyanosis, Clubbing or edema, No new Rash or bruise   PERTINENT RADIOLOGIC STUDIES: No results found.    PERTINENT LAB RESULTS: CBC: Recent Labs    03/17/24 0245 03/18/24 0249  WBC  10.7* 8.9  HGB 11.9* 10.9*  HCT 38.5 34.8*  PLT 244 277   CMET CMP     Component Value Date/Time   NA 137 03/18/2024 0249   K 4.1 03/18/2024 0249   CL 103 03/18/2024 0249   CO2 25 03/18/2024 0249   GLUCOSE 82 03/18/2024 0249   BUN 18 03/18/2024 0249   CREATININE 1.26 (H) 03/18/2024 0249   CREATININE 1.10 (H) 10/10/2022 1612   CALCIUM  8.5 (L) 03/18/2024 0249   PROT 6.6 03/17/2024 0245   ALBUMIN 2.3 (L) 03/17/2024 0245   AST 23 03/17/2024 0245   ALT 14 03/17/2024 0245   ALKPHOS 57 03/17/2024 0245   BILITOT 0.5 03/17/2024 0245   EGFR 47 (L) 10/10/2022 1612   GFRNONAA 40 (L) 03/18/2024 0249   GFRNONAA 36 (L) 10/13/2020 1105    GFR Estimated Creatinine Clearance: 17.8 mL/min (A) (by C-G formula based on SCr of 1.26 mg/dL (H)). No results for input(s): LIPASE, AMYLASE in the last 72 hours. No results for  input(s): CKTOTAL, CKMB, CKMBINDEX, TROPONINI in the last 72 hours. Invalid input(s): POCBNP No results for input(s): DDIMER in the last 72 hours. Recent Labs    03/17/24 0245  HGBA1C 6.0*   No results for input(s): CHOL, HDL, LDLCALC, TRIG, CHOLHDL, LDLDIRECT in the last 72 hours. No results for input(s): TSH, T4TOTAL, T3FREE, THYROIDAB in the last 72 hours.  Invalid input(s): FREET3 No results for input(s): VITAMINB12, FOLATE, FERRITIN, TIBC, IRON, RETICCTPCT in the last 72 hours. Coags: No results for input(s): INR in the last 72 hours.  Invalid input(s): PT Microbiology: Recent Results (from the past 240 hours)  Culture, blood (single)     Status: None (Preliminary result)   Collection Time: 03/16/24  5:36 PM   Specimen: BLOOD RIGHT HAND  Result Value Ref Range Status   Specimen Description BLOOD RIGHT HAND  Final   Special Requests   Final    BOTTLES DRAWN AEROBIC AND ANAEROBIC Blood Culture results may not be optimal due to an inadequate volume of blood received in culture bottles   Culture   Final    NO GROWTH 3 DAYS Performed at [facility], [address] [place], [place] [address]    Report Status PENDING  Incomplete    FURTHER DISCHARGE INSTRUCTIONS:  Get Medicines reviewed and adjusted: Please take all your medications with you for your next visit with your Primary MD  Laboratory/radiological data: Please request your Primary MD to go over all hospital tests and procedure/radiological results at the follow up, please ask your Primary MD to get all Hospital records sent to his/her office.  In some cases, they will be blood work, cultures and biopsy results pending at the time of your discharge. Please request that your primary care M.D. goes through all the records of your hospital data and follows up on these results.  Also Note the following: If you experience worsening of your admission symptoms,  develop shortness of breath, life threatening emergency, suicidal or homicidal thoughts you must seek medical attention immediately by calling 911 or calling your MD immediately  if symptoms less severe.  You must read complete instructions/literature along with all the possible adverse reactions/side effects for all the Medicines you take and that have been prescribed to you. Take any new Medicines after you have completely understood and accpet all the possible adverse reactions/side effects.   Do not drive when taking Pain medications or sleeping medications (Benzodaizepines)  Do not take more than prescribed  Pain, Sleep and Anxiety Medications. It is not advisable to combine anxiety,sleep and pain medications without talking with your primary care practitioner  Special Instructions: If you have smoked or chewed Tobacco  in the last 2 yrs please stop smoking, stop any regular Alcohol  and or any Recreational drug use.  Wear Seat belts while driving.  Please note: You were cared for by a hospitalist during your hospital stay. Once you are discharged, your primary care physician will handle any further medical issues. Please note that NO REFILLS for any discharge medications will be authorized once you are discharged, as it is imperative that you return to your primary care physician (or establish a relationship with a primary care physician if you do not have one) for your post hospital discharge needs so that they can reassess your need for medications and monitor your lab values.  Total Time spent coordinating discharge including counseling, education and face to face time equals greater than 30 minutes.  SignedBETHA Donalda Applebaum 03/19/2024 11:52 AM

## 2024-03-21 LAB — CULTURE, BLOOD (SINGLE): Culture: NO GROWTH

## 2024-03-22 NOTE — Care Management Important Message (Signed)
 Important Message  Patient Details  Name: Dawn Conner MRN: [id number] Date of Birth: 1928/03/17   Important Message Given:  No     [name] 03/22/2024, 8:51 AM

## 2024-03-22 NOTE — Progress Notes (Signed)
 Patient discharged, Important Message Letter mailed to patient.

## 2024-05-01 ENCOUNTER — Emergency Department (HOSPITAL_COMMUNITY)

## 2024-05-01 ENCOUNTER — Emergency Department (HOSPITAL_COMMUNITY)
Admission: EM | Admit: 2024-05-01 | Discharge: 2024-05-02 | Disposition: A | Discharge status: Skilled Nursing Facility | Source: Skilled Nursing Facility | Attending: Emergency Medicine | Admitting: Emergency Medicine

## 2024-05-01 DIAGNOSIS — Z7982 Long term (current) use of aspirin: Secondary | ICD-10-CM | POA: Insufficient documentation

## 2024-05-01 DIAGNOSIS — I10 Essential (primary) hypertension: Secondary | ICD-10-CM | POA: Diagnosis not present

## 2024-05-01 DIAGNOSIS — S7002XA Contusion of left hip, initial encounter: Secondary | ICD-10-CM | POA: Insufficient documentation

## 2024-05-01 DIAGNOSIS — F0390 Unspecified dementia without behavioral disturbance: Secondary | ICD-10-CM | POA: Diagnosis not present

## 2024-05-01 DIAGNOSIS — Z79899 Other long term (current) drug therapy: Secondary | ICD-10-CM | POA: Diagnosis not present

## 2024-05-01 DIAGNOSIS — R7989 Other specified abnormal findings of blood chemistry: Secondary | ICD-10-CM | POA: Insufficient documentation

## 2024-05-01 DIAGNOSIS — R600 Localized edema: Secondary | ICD-10-CM | POA: Diagnosis not present

## 2024-05-01 DIAGNOSIS — W1830XA Fall on same level, unspecified, initial encounter: Secondary | ICD-10-CM | POA: Insufficient documentation

## 2024-05-01 DIAGNOSIS — S79912A Unspecified injury of left hip, initial encounter: Secondary | ICD-10-CM | POA: Diagnosis present

## 2024-05-01 LAB — COMPREHENSIVE METABOLIC PANEL WITH GFR
ALT: 12 U/L (ref 0–44)
AST: 25 U/L (ref 15–41)
Albumin: 3 g/dL — ABNORMAL LOW (ref 3.5–5.0)
Alkaline Phosphatase: 74 U/L (ref 38–126)
Anion gap: 9 (ref 5–15)
BUN: 26 mg/dL — ABNORMAL HIGH (ref 8–23)
CO2: 23 mmol/L (ref 22–32)
Calcium: 8.5 mg/dL — ABNORMAL LOW (ref 8.9–10.3)
Chloride: 105 mmol/L (ref 98–111)
Creatinine, Ser: 1.2 mg/dL — ABNORMAL HIGH (ref 0.44–1.00)
GFR, Estimated: 42 mL/min — ABNORMAL LOW
Glucose, Bld: 82 mg/dL (ref 70–99)
Potassium: 3.9 mmol/L (ref 3.5–5.1)
Sodium: 137 mmol/L (ref 135–145)
Total Bilirubin: 0.3 mg/dL (ref 0.0–1.2)
Total Protein: 6.3 g/dL — ABNORMAL LOW (ref 6.5–8.1)

## 2024-05-01 LAB — TROPONIN T, HIGH SENSITIVITY: Troponin T High Sensitivity: 46 ng/L — ABNORMAL HIGH (ref 0–19)

## 2024-05-01 LAB — CBC WITH DIFFERENTIAL/PLATELET
Abs Immature Granulocytes: 0.04 K/uL (ref 0.00–0.07)
Basophils Absolute: 0 K/uL (ref 0.0–0.1)
Basophils Relative: 0 %
Eosinophils Absolute: 0.1 K/uL (ref 0.0–0.5)
Eosinophils Relative: 1 %
HCT: 34.2 % — ABNORMAL LOW (ref 36.0–46.0)
Hemoglobin: 10.7 g/dL — ABNORMAL LOW (ref 12.0–15.0)
Immature Granulocytes: 1 %
Lymphocytes Relative: 21 %
Lymphs Abs: 1.7 K/uL (ref 0.7–4.0)
MCH: 26.3 pg (ref 26.0–34.0)
MCHC: 31.3 g/dL (ref 30.0–36.0)
MCV: 84 fL (ref 80.0–100.0)
Monocytes Absolute: 0.8 K/uL (ref 0.1–1.0)
Monocytes Relative: 10 %
Neutro Abs: 5.5 K/uL (ref 1.7–7.7)
Neutrophils Relative %: 67 %
Platelets: 274 K/uL (ref 150–400)
RBC: 4.07 MIL/uL (ref 3.87–5.11)
RDW: 16.7 % — ABNORMAL HIGH (ref 11.5–15.5)
WBC: 8.2 K/uL (ref 4.0–10.5)
nRBC: 0 % (ref 0.0–0.2)

## 2024-05-01 NOTE — ED Provider Notes (Signed)
 11:48 PM Assumed care from Dr. Randol, please see their note for full history, physical and decision making until this point. In brief this is a 89 y.o. year old female who presented to the ED tonight with Fall (Pt had unwitness fall this am around 10:30 facility states they found bruises on left hip and left hand.)     Fall today. Not syncopal. Needs second troponin to ensure it is flat. More bruises on hip than normal so pending imaging of same. SNF d/c if ok.   Hip shows hematoma. Second troponin is flat. Wrist xr not done, on my evaluation she does have bruising there but no deformity or ttp. In fact while palpating her wrist/s she states 'it feels good'. Full ROM of wrist, don't think she needs the wrist xr at this point. Will d/c per previous providers plan.   Discharge instructions, including strict return precautions for new or worsening symptoms, given. Patient and/or family verbalized understanding and agreement with the plan as described.   Labs, studies and imaging reviewed by myself and considered in medical decision making if ordered. Imaging interpreted by radiology.  Labs Reviewed  COMPREHENSIVE METABOLIC PANEL WITH GFR - Abnormal; Notable for the following components:      Result Value   BUN 26 (*)    Creatinine, Ser 1.20 (*)    Calcium  8.5 (*)    Total Protein 6.3 (*)    Albumin 3.0 (*)    GFR, Estimated 42 (*)    All other components within normal limits  CBC WITH DIFFERENTIAL/PLATELET - Abnormal; Notable for the following components:   Hemoglobin 10.7 (*)    HCT 34.2 (*)    RDW 16.7 (*)    All other components within normal limits  TROPONIN T, HIGH SENSITIVITY - Abnormal; Notable for the following components:   Troponin T High Sensitivity 46 (*)    All other components within normal limits  URINALYSIS, ROUTINE W REFLEX MICROSCOPIC  CBG MONITORING, ED  TROPONIN T, HIGH SENSITIVITY    CT HEAD WO CONTRAST  Final Result    CT CERVICAL SPINE WO CONTRAST  Final  Result    DG Hip Unilat W or Wo Pelvis 2-3 Views Left  Final Result    DG Chest 2 View  Final Result    DG Hand Complete Left    (Results Pending)  CT Hip Left Wo Contrast    (Results Pending)    No follow-ups on file.    Cherylanne Ardelean, Selinda, MD 05/02/24 [phone]

## 2024-05-01 NOTE — ED Triage Notes (Signed)
 Pt c/o some neck pain and guarding of the left hip states it feels funny has history of dementia

## 2024-05-01 NOTE — ED Provider Notes (Signed)
 " [place] [facility] AT [facility] Provider Note   CSN: [id number] Arrival date & time: 05/01/24  2127     Patient presents with: Fall (Pt had unwitness fall this am around 10:30 facility states they found bruises on left hip and left hand.)   Dawn Conner is a 89 y.o. female.  {Add pertinent medical, surgical, social history, OB history to YEP:67052}  Fall     Patient has a history of hypertension hyperlipidemia osteoporosis diverticulosis renal insufficiency dementia.  Patient was sent into the ED for evaluation of unwitnessed fall this a.m. at the nursing facility.  Patient has been complaining some pain in her hand and her left hip.  Patient is not able to tell me why she is here.  She does not think she called the ambulance.  Patient states she is nervous and does not want to be here  Prior to Admission medications  Medication Sig Start Date End Date Taking? Authorizing Provider  acetaminophen  (TYLENOL ) 325 MG tablet Take 650 mg by mouth every 4 (four) hours as needed for moderate pain (pain score 4-6).    [provider]  Amino Acids-Protein Hydrolys (PRO-STAT AWC) LIQD Take 30 mLs by mouth 2 (two) times daily between meals.    [provider]  aspirin  325 MG tablet Take 325 mg by mouth daily.    [provider]  atorvastatin  (LIPITOR) 40 MG tablet Take 40 mg by mouth daily. 03/02/24   [provider]  calcium  carbonate (OSCAL) 1500 (600 Ca) MG TABS tablet Take 2 tablets by mouth at bedtime.    [provider]  calcium  carbonate (TUMS - DOSED IN MG ELEMENTAL CALCIUM ) 500 MG chewable tablet Chew 1 tablet by mouth See admin instructions. Before meals    [provider]  cholecalciferol (VITAMIN D3) 25 MCG (1000 UNIT) tablet Take 1,000 Units by mouth daily.    [provider]  donepezil  (ARICEPT ) 10 MG tablet Take 1 tablet (10 mg total) by mouth at bedtime. 10/10/22   Duanne Butler DASEN, MD  ferrous  sulfate 324 MG TBEC Take 324 mg by mouth daily with breakfast.    [provider]  lactose free nutrition (BOOST) LIQD Take 237 mLs by mouth 2 (two) times daily between meals.    [provider]  levothyroxine  (SYNTHROID ) 75 MCG tablet TAKE 1 TABLET BY MOUTH BEFORE BREAKFAST 01/04/22   Duanne Butler DASEN, MD  loperamide  (IMODIUM  A-D) 2 MG tablet Take 2 mg by mouth every 6 (six) hours as needed for diarrhea or loose stools.    [provider]  melatonin 5 MG TABS Take 1 tablet (5 mg total) by mouth at bedtime. 03/18/24   Ghimire, Donalda HERO, MD  Multiple Vitamins-Minerals (MULTIVITAMIN ADULTS 50+ PO) Take 1 tablet by mouth daily.    [provider]  Nutritional Supplements (ENSURE ACTIVE HIGH PROTEIN) LIQD Take 1 Can by mouth in the morning, at noon, in the evening, and at bedtime. 02/25/23   Pearlean Manus, MD  OLANZapine  zydis (ZYPREXA ) 5 MG disintegrating tablet Take 1 tablet (5 mg total) by mouth 2 (two) times daily as needed (agiation). 03/18/24   Ghimire, Donalda HERO, MD  pantoprazole  (PROTONIX ) 40 MG tablet Take 1 tablet (40 mg total) by mouth daily at 12 noon. 03/19/24   Ghimire, Donalda HERO, MD  psyllium (REGULOID) 0.52 g capsule Take 0.52 g by mouth daily.    [provider]  saccharomyces boulardii (FLORASTOR) 250 MG capsule Take 250 mg  by mouth 2 (two) times daily.    [provider]  Zinc Oxide 12 % CREA Apply topically 2 (two) times daily.    [provider]    Allergies: Lactose intolerance (gi) and Sulfa antibiotics    Review of Systems  Updated Vital Signs BP 106/64 (BP Location: Right Leg)   Pulse 84   Temp (!) 97 F (36.1 C) (Axillary)   Resp 20   SpO2 99%   Physical Exam Vitals and nursing note reviewed.  Constitutional:      Appearance: She is well-developed. She is not diaphoretic.     Comments: Elderly, frail  HENT:     Head: Normocephalic and atraumatic.     Right Ear: External ear normal.     Left Ear:  External ear normal.  Eyes:     General: No scleral icterus.       Right eye: No discharge.        Left eye: No discharge.     Conjunctiva/sclera: Conjunctivae normal.  Neck:     Trachea: No tracheal deviation.  Cardiovascular:     Rate and Rhythm: Normal rate and regular rhythm.  Pulmonary:     Effort: Pulmonary effort is normal. No respiratory distress.     Breath sounds: Normal breath sounds. No stridor. No wheezing or rales.  Abdominal:     General: Bowel sounds are normal. There is no distension.     Palpations: Abdomen is soft.     Tenderness: There is no abdominal tenderness. There is no guarding or rebound.  Musculoskeletal:        General: No tenderness or deformity.     Cervical back: Neck supple.     Right lower leg: Edema present.     Left lower leg: Edema present.     Comments: Mild edema lower extremities, tenderness to palpation left hip, mild tenderness left hand, no cervical thoracic or lumbar spine tenderness  Skin:    General: Skin is warm and dry.     Findings: No rash.  Neurological:     General: No focal deficit present.     Mental Status: She is alert.     Cranial Nerves: No cranial nerve deficit, dysarthria or facial asymmetry.     Sensory: No sensory deficit.     Motor: No weakness, abnormal muscle tone or seizure activity.     Coordination: Coordination normal.  Psychiatric:        Mood and Affect: Mood normal.     (all labs ordered are listed, but only abnormal results are displayed) Labs Reviewed - No data to display  EKG: None  Radiology: No results found.  {Document cardiac monitor, telemetry assessment procedure when appropriate:32947} Procedures   Medications Ordered in the ED - No data to display    {Click here for ABCD2, HEART and other calculators REFRESH Note before signing:1}                              Medical Decision Making  ***  {Document critical care time when appropriate  Document review of labs and clinical  decision tools ie CHADS2VASC2, etc  Document your independent review of radiology images and any outside records  Document your discussion with family members, caretakers and with consultants  Document social determinants of health affecting pt's care  Document your decision making why or why not admission, treatments were needed:32947:::1}   Final diagnoses:  None  ED Discharge Orders     None        "

## 2024-05-01 NOTE — ED Notes (Signed)
 When you have time, Niels Dibbles (daughter; power of attorney) [phone] would like an update on pt. Status. Thank you

## 2024-05-02 ENCOUNTER — Emergency Department (HOSPITAL_COMMUNITY)

## 2024-05-02 DIAGNOSIS — S7002XA Contusion of left hip, initial encounter: Secondary | ICD-10-CM | POA: Diagnosis not present

## 2024-05-02 LAB — URINALYSIS, ROUTINE W REFLEX MICROSCOPIC
Bilirubin Urine: NEGATIVE
Glucose, UA: NEGATIVE mg/dL
Hgb urine dipstick: NEGATIVE
Ketones, ur: NEGATIVE mg/dL
Nitrite: NEGATIVE
Protein, ur: NEGATIVE mg/dL
Specific Gravity, Urine: 1.014 (ref 1.005–1.030)
WBC, UA: >50 WBC/hpf (ref 0–5)
pH: 5 (ref 5.0–8.0)

## 2024-05-02 LAB — TROPONIN T, HIGH SENSITIVITY: Troponin T High Sensitivity: 43 ng/L — ABNORMAL HIGH (ref 0–19)

## 2024-05-02 LAB — CBG MONITORING, ED: Glucose-Capillary: 90 mg/dL (ref 70–99)

## 2024-05-02 NOTE — ED Notes (Signed)
 Daughter, Niels, notified of patient discharge back to facility.

## 2024-05-02 NOTE — ED Notes (Signed)
 [facility] transported patient to [facility].

## 2024-05-02 NOTE — ED Notes (Signed)
 Vernell at [facility] notified of patient discharge back to facility.

## 2024-05-04 LAB — URINE CULTURE: Culture: >=100000 — AB

## 2024-05-05 ENCOUNTER — Telehealth (HOSPITAL_BASED_OUTPATIENT_CLINIC_OR_DEPARTMENT_OTHER): Payer: Self-pay | Admitting: *Deleted

## 2024-05-05 NOTE — Telephone Encounter (Signed)
 Post ED Visit - Positive Culture Follow-up  Culture report reviewed by antimicrobial stewardship pharmacist: [facility] [x]  [place], [place].D. []  Venetia Gully, Pharm.D., BCPS AQ-ID []  Garrel Crews, Pharm.D., BCPS []  Almarie Lunger, Pharm.D., BCPS []  [place], [place].D., BCPS, AAHIVP []  Rosaline Bihari, Pharm.D., BCPS, AAHIVP []  Vernell Meier, PharmD, BCPS []  Latanya Hint, PharmD, BCPS []  Donald Medley, PharmD, BCPS []  Rocky Bold, PharmD []  Dorothyann Alert, PharmD, BCPS []  Morene Babe, PharmD  [facility] []  Rosaline Edison, PharmD []  Romona Bliss, PharmD []  Dolphus Roller, PharmD []  Veva Seip, Rph []  Vernell Daunt) Leonce, PharmD []  Rogena Allis, PharmD []  Rosaline Millet, PharmD []  Iantha Batch, PharmD []  Arvin Gauss, PharmD []  Wanda Hasting, PharmD []  Ronal Rav, PharmD []  Rocky Slade, PharmD []  Bard Jeans, PharmD   Positive urine culture Pt with no urinary complaints,  no further patient follow-up is required at this time.  Jama Wyman Kipper 05/05/2024, 12:54 PM

## 2024-05-26 ENCOUNTER — Other Ambulatory Visit: Payer: Self-pay

## 2024-05-26 DIAGNOSIS — I70229 Atherosclerosis of native arteries of extremities with rest pain, unspecified extremity: Secondary | ICD-10-CM

## 2024-05-27 ENCOUNTER — Ambulatory Visit (HOSPITAL_COMMUNITY): Admission: RE | Admit: 2024-05-27 | Discharge: 2024-05-27 | Attending: Surgery | Admitting: Surgery

## 2024-05-27 ENCOUNTER — Encounter: Payer: Self-pay | Admitting: Vascular Surgery

## 2024-05-27 ENCOUNTER — Ambulatory Visit: Admitting: Vascular Surgery

## 2024-05-27 VITALS — HR 82 | Temp 98.0°F | Resp 22 | Ht 59.0 in | Wt 91.0 lb

## 2024-05-27 DIAGNOSIS — I70229 Atherosclerosis of native arteries of extremities with rest pain, unspecified extremity: Secondary | ICD-10-CM

## 2024-05-27 LAB — VAS US ABI WITH/WO TBI
Left ABI: 0.34
Right ABI: 0.92

## 2024-05-27 NOTE — Progress Notes (Signed)
 " Office Note     CC: Left lower extremity tissue loss Requesting Provider:  Lanell Jacobsen, NP  HPI: Zaylin Pistilli is a 89 y.o. (04-14-1928) female presenting at the request of .Pia Kerney SQUIBB, MD left lower extremity tissue loss.  On exam, Foye was relatively nonparticipatory, accompanied by her daughter.  She presented in a wheelchair, and currently resides in an assisted living facility.  She was in memory care prior to taking a fall, and is now relatively nonambulatory, but still trying to work with physical therapy.  She has advanced dementia, and thought that she was in [place]  today in the office.  Margery has had a wound in between the 1st and 2nd toe on the left foot for quite some time.  She was seen in the hospital and no intervention was offered.  Furthermore, family does not want intervention due to her advanced age.   All of today's interview occurred with significant help from her daughter.   Past Medical History:  Diagnosis Date   Dementia (HCC)    Diverticulosis    Hyperlipidemia    Hypertension    Osteoporosis    Oth fracture of shaft of right humerus, init for opn fx 1990's   Renal insufficiency    Thyroid  disease     Past Surgical History:  Procedure Laterality Date   ABDOMINAL HYSTERECTOMY  1972   APPENDECTOMY     cataract surgery     Early 2000's both eyes    CHOLECYSTECTOMY  1980's   COLONOSCOPY  03/10/2006   [place] ; severe diverticulosis especially involving the sigmoid and to a lesser extent all other segments as well.  There was no evidence of neoplastic pathology. Recommended continuing Metamucil as well as Questran  which appeared to keep her fairly regular. Consider repeat in 5 years.   OVARIAN CYST REMOVAL  09/2012   TONSILLECTOMY  age 59    Social History   Socioeconomic History   Marital status: Widowed    Spouse name: Not on file   Number of children: Not on file   Years of education: Not on file   Highest education level: Not on  file  Occupational History   Not on file  Tobacco Use   Smoking status: Never   Smokeless tobacco: Never  Vaping Use   Vaping status: Never Used  Substance and Sexual Activity   Alcohol use: Never   Drug use: Never   Sexual activity: Not Currently  Other Topics Concern   Not on file  Social History Narrative   Lives with daughter Niels and son in social worker.    Social Drivers of Health   Tobacco Use: Low Risk (02/12/2024)   Patient History    Smoking Tobacco Use: Never    Smokeless Tobacco Use: Never    Passive Exposure: Not on file  Financial Resource Strain: Low Risk (05/03/2022)   Overall Financial Resource Strain (CARDIA)    Difficulty of Paying Living Expenses: Not hard at all  Food Insecurity: Patient Unable To Answer (03/18/2024)   Epic    Worried About Programme Researcher, Broadcasting/film/video in the Last Year: Patient unable to answer    Ran Out of Food in the Last Year: Patient unable to answer  Transportation Needs: Patient Unable To Answer (03/18/2024)   Epic    Lack of Transportation (Medical): Patient unable to answer    Lack of Transportation (Non-Medical): Patient unable to answer  Physical Activity: Insufficiently Active (05/03/2022)   Exercise Vital Sign    Days  of Exercise per Week: 3 days    Minutes of Exercise per Session: 10 min  Stress: No Stress Concern Present (05/03/2022)   [employer] of Occupational Health - Occupational Stress Questionnaire    Feeling of Stress : Only a little  Social Connections: Unknown (03/18/2024)   Social Connection and Isolation Panel    Frequency of Communication with Friends and Family: Patient unable to answer    Frequency of Social Gatherings with Friends and Family: Patient unable to answer    Attends Religious Services: Patient unable to answer    Active Member of Clubs or Organizations: Not on file    Attends Banker Meetings: Patient unable to answer    Marital Status: Not on file  Intimate Partner Violence: Unknown  (03/18/2024)   Epic    Fear of Current or Ex-Partner: Patient unable to answer    Emotionally Abused: Patient unable to answer    Physically Abused: Not on file    Sexually Abused: Patient unable to answer  Depression (PHQ2-9): Low Risk (05/03/2022)   Depression (PHQ2-9)    PHQ-2 Score: 1  Alcohol Screen: Low Risk (05/03/2022)   Alcohol Screen    Last Alcohol Screening Score (AUDIT): 0  Housing: Unknown (03/18/2024)   Epic    Unable to Pay for Housing in the Last Year: Patient unable to answer    Number of Times Moved in the Last Year: 0    Homeless in the Last Year: Patient unable to answer  Utilities: Patient Unable To Answer (03/18/2024)   Epic    Threatened with loss of utilities: Patient unable to answer  Health Literacy: Not on file   Family History  Problem Relation Age of Onset   Colon cancer Neg Hx     Current Outpatient Medications  Medication Sig Dispense Refill   acetaminophen  (TYLENOL ) 325 MG tablet Take 650 mg by mouth every 4 (four) hours as needed for moderate pain (pain score 4-6).     Amino Acids-Protein Hydrolys (PRO-STAT AWC) LIQD Take 30 mLs by mouth 2 (two) times daily between meals.     aspirin  325 MG tablet Take 325 mg by mouth daily.     atorvastatin  (LIPITOR) 40 MG tablet Take 40 mg by mouth daily.     calcium  carbonate (OSCAL) 1500 (600 Ca) MG TABS tablet Take 2 tablets by mouth at bedtime.     calcium  carbonate (TUMS - DOSED IN MG ELEMENTAL CALCIUM ) 500 MG chewable tablet Chew 1 tablet by mouth See admin instructions. Before meals     cholecalciferol (VITAMIN D3) 25 MCG (1000 UNIT) tablet Take 1,000 Units by mouth daily.     donepezil  (ARICEPT ) 10 MG tablet Take 1 tablet (10 mg total) by mouth at bedtime. 30 tablet 11   ferrous sulfate 324 MG TBEC Take 324 mg by mouth daily with breakfast.     lactose free nutrition (BOOST) LIQD Take 237 mLs by mouth 2 (two) times daily between meals.     levothyroxine  (SYNTHROID ) 75 MCG tablet TAKE 1 TABLET BY MOUTH  BEFORE BREAKFAST 90 tablet 3   loperamide  (IMODIUM  A-D) 2 MG tablet Take 2 mg by mouth every 6 (six) hours as needed for diarrhea or loose stools.     melatonin 5 MG TABS Take 1 tablet (5 mg total) by mouth at bedtime.     Multiple Vitamins-Minerals (MULTIVITAMIN ADULTS 50+ PO) Take 1 tablet by mouth daily.     Nutritional Supplements (ENSURE ACTIVE HIGH PROTEIN) LIQD Take 1  Can by mouth in the morning, at noon, in the evening, and at bedtime. 237 mL 3   OLANZapine  zydis (ZYPREXA ) 5 MG disintegrating tablet Take 1 tablet (5 mg total) by mouth 2 (two) times daily as needed (agiation).     pantoprazole  (PROTONIX ) 40 MG tablet Take 1 tablet (40 mg total) by mouth daily at 12 noon.     psyllium (REGULOID) 0.52 g capsule Take 0.52 g by mouth daily.     saccharomyces boulardii (FLORASTOR) 250 MG capsule Take 250 mg by mouth 2 (two) times daily.     Zinc Oxide 12 % CREA Apply topically 2 (two) times daily.     No current facility-administered medications for this visit.    Allergies[1]   REVIEW OF SYSTEMS:  [X]  denotes positive finding, [ ]  denotes negative finding Cardiac  Comments:  Chest pain or chest pressure:    Shortness of breath upon exertion:    Short of breath when lying flat:    Irregular heart rhythm:        Vascular    Pain in calf, thigh, or hip brought on by ambulation:    Pain in feet at night that wakes you up from your sleep:     Blood clot in your veins:    Leg swelling:         Pulmonary    Oxygen at home:    Productive cough:     Wheezing:         Neurologic    Sudden weakness in arms or legs:     Sudden numbness in arms or legs:     Sudden onset of difficulty speaking or slurred speech:    Temporary loss of vision in one eye:     Problems with dizziness:         Gastrointestinal    Blood in stool:     Vomited blood:         Genitourinary    Burning when urinating:     Blood in urine:        Psychiatric    Major depression:         Hematologic     Bleeding problems:    Problems with blood clotting too easily:        Skin    Rashes or ulcers:        Constitutional    Fever or chills:      PHYSICAL EXAMINATION:  There were no vitals filed for this visit.  General:  WDWN in NAD; vital signs documented above Gait: Not observed HENT: WNL, normocephalic Pulmonary: normal non-labored breathing , without wheezing Cardiac: regular HR Abdomen: soft, NT, no masses Skin: without rashes Vascular Exam/Pulses:  Right Left  Radial 2+ (normal) 2+ (normal)  Ulnar    Femoral    Popliteal    DP    PT     Extremities: with ischemic changes, without Gangrene , without cellulitis; with open wounds;  Musculoskeletal: no muscle wasting or atrophy  Neurologic: A&O X 3;  No focal weakness or paresthesias are detected Psychiatric:  The pt has Normal affect.   Non-Invasive Vascular Imaging:      +-------+-----------+-----------+------------+------------+  ABI/TBIToday's ABIToday's TBIPrevious ABIPrevious TBI  +-------+-----------+-----------+------------+------------+  Right 0.92       0.71                                 +-------+-----------+-----------+------------+------------+  Left  0.34       0.00                                 +-------+-----------+-----------+------------+------------+      ASSESSMENT/PLAN: Jailey Booton is a 89 y.o. female presenting with left lower extremity critical limb ischemia with tissue loss in between the 1st and 2nd digit, specifically on the second digit.  In short, she is not a revascularization candidate due to her nonambulatory status, as well as other comorbidities outlined above.  I had a long conversation with Tamatha and her daughter regarding the above.  Her daughter was very understanding, and also felt that she was not a revascularization candidate.  There are ongoing conversations regarding de-escalation of care.  We discussed that should the wound become infected, the only  surgery I would offer would be above-knee amputation, but I would recommend palliative/comfort care with no surgery.  They were very appreciative of our discussion.  I am available as needed.   Fonda FORBES Rim, MD [facility] [phone]     [1]  Allergies Allergen Reactions   Lactose Intolerance (Gi) Nausea And Vomiting   Sulfa Antibiotics    "
